# Patient Record
Sex: Female | Born: 1970 | Race: White | Hispanic: No | Marital: Single | Smoking: Former smoker
Health system: Southern US, Community
[De-identification: ages and names within clinical notes are randomized; demographics above are authoritative.]

## PROBLEM LIST (undated history)

## (undated) DIAGNOSIS — E7800 Pure hypercholesterolemia, unspecified: Secondary | ICD-10-CM

## (undated) DIAGNOSIS — J45990 Exercise induced bronchospasm: Secondary | ICD-10-CM

## (undated) DIAGNOSIS — E559 Vitamin D deficiency, unspecified: Secondary | ICD-10-CM

## (undated) DIAGNOSIS — R7303 Prediabetes: Secondary | ICD-10-CM

## (undated) DIAGNOSIS — T4145XA Adverse effect of unspecified anesthetic, initial encounter: Secondary | ICD-10-CM

## (undated) DIAGNOSIS — Z87442 Personal history of urinary calculi: Secondary | ICD-10-CM

## (undated) DIAGNOSIS — F32A Depression, unspecified: Secondary | ICD-10-CM

## (undated) DIAGNOSIS — T8859XA Other complications of anesthesia, initial encounter: Secondary | ICD-10-CM

## (undated) DIAGNOSIS — R32 Unspecified urinary incontinence: Secondary | ICD-10-CM

## (undated) DIAGNOSIS — M069 Rheumatoid arthritis, unspecified: Secondary | ICD-10-CM

## (undated) DIAGNOSIS — N200 Calculus of kidney: Secondary | ICD-10-CM

## (undated) DIAGNOSIS — T7840XA Allergy, unspecified, initial encounter: Secondary | ICD-10-CM

## (undated) DIAGNOSIS — F329 Major depressive disorder, single episode, unspecified: Secondary | ICD-10-CM

## (undated) DIAGNOSIS — G4730 Sleep apnea, unspecified: Secondary | ICD-10-CM

## (undated) HISTORY — DX: Exercise induced bronchospasm: J45.990

## (undated) HISTORY — DX: Pure hypercholesterolemia, unspecified: E78.00

## (undated) HISTORY — DX: Unspecified urinary incontinence: R32

## (undated) HISTORY — DX: Calculus of kidney: N20.0

## (undated) HISTORY — PX: PALATE / UVULA BIOPSY / EXCISION: SUR128

## (undated) HISTORY — DX: Prediabetes: R73.03

## (undated) HISTORY — PX: GALLBLADDER SURGERY: SHX652

## (undated) HISTORY — DX: Allergy, unspecified, initial encounter: T78.40XA

## (undated) HISTORY — DX: Vitamin D deficiency, unspecified: E55.9

## (undated) HISTORY — PX: OTHER SURGICAL HISTORY: SHX169

## (undated) HISTORY — PX: KIDNEY STONE SURGERY: SHX686

## (undated) HISTORY — DX: Depression, unspecified: F32.A

## (undated) HISTORY — DX: Rheumatoid arthritis, unspecified: M06.9

## (undated) HISTORY — PX: TONSILLECTOMY: SUR1361

---
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
## 1998-05-20 ENCOUNTER — Encounter: Payer: Self-pay | Admitting: Family Medicine

## 1998-05-20 ENCOUNTER — Ambulatory Visit (HOSPITAL_COMMUNITY): Admission: RE | Admit: 1998-05-20 | Discharge: 1998-05-20 | Payer: Self-pay | Admitting: Family Medicine

## 2004-03-05 ENCOUNTER — Emergency Department (HOSPITAL_COMMUNITY): Admission: EM | Admit: 2004-03-05 | Discharge: 2004-03-05 | Payer: Self-pay | Admitting: Emergency Medicine

## 2006-03-14 ENCOUNTER — Other Ambulatory Visit: Admission: RE | Admit: 2006-03-14 | Discharge: 2006-03-14 | Payer: Self-pay | Admitting: Internal Medicine

## 2008-09-27 ENCOUNTER — Ambulatory Visit (HOSPITAL_COMMUNITY): Admission: RE | Admit: 2008-09-27 | Discharge: 2008-09-28 | Payer: Self-pay | Admitting: Otolaryngology

## 2008-09-27 ENCOUNTER — Encounter (INDEPENDENT_AMBULATORY_CARE_PROVIDER_SITE_OTHER): Payer: Self-pay | Admitting: Otolaryngology

## 2008-11-28 ENCOUNTER — Ambulatory Visit (HOSPITAL_BASED_OUTPATIENT_CLINIC_OR_DEPARTMENT_OTHER): Admission: RE | Admit: 2008-11-28 | Discharge: 2008-11-28 | Payer: Self-pay | Admitting: Otolaryngology

## 2008-12-08 ENCOUNTER — Ambulatory Visit: Payer: Self-pay | Admitting: Internal Medicine

## 2009-06-14 ENCOUNTER — Encounter: Admission: RE | Admit: 2009-06-14 | Discharge: 2009-06-14 | Payer: Self-pay | Admitting: Internal Medicine

## 2010-04-24 LAB — BASIC METABOLIC PANEL
BUN: 11 mg/dL (ref 6–23)
CO2: 22 mEq/L (ref 19–32)
Calcium: 9 mg/dL (ref 8.4–10.5)
Chloride: 105 mEq/L (ref 96–112)
Creatinine, Ser: 0.82 mg/dL (ref 0.4–1.2)
GFR calc Af Amer: >60 mL/min (ref >60)
GFR calc non Af Amer: >60 mL/min (ref >60)
Glucose, Bld: 110 mg/dL — ABNORMAL HIGH (ref 70–99)
Potassium: 4.4 mEq/L (ref 3.5–5.1)
Sodium: 135 mEq/L (ref 135–145)

## 2010-04-24 LAB — CBC
HCT: 42.3 % (ref 36.0–46.0)
Hemoglobin: 14.3 g/dL (ref 12.0–15.0)
MCHC: 33.7 g/dL (ref 30.0–36.0)
MCV: 91.3 fL (ref 78.0–100.0)
Platelets: 284 10*3/uL (ref 150–400)
RBC: 4.64 MIL/uL (ref 3.87–5.11)
RDW: 13.8 % (ref 11.5–15.5)
WBC: 11.7 10*3/uL — ABNORMAL HIGH (ref 4.0–10.5)

## 2010-07-29 IMAGING — CR DG CHEST 2V
2 series · 2 of 2 positions shown · non-contrast
Comparison: None

CLINICAL DATA: History of coughing.  History of previous tobacco
smoking.  Cardiopulmonary evaluation.

CHEST - 2 VIEW

[view not recorded (1 of 2)]
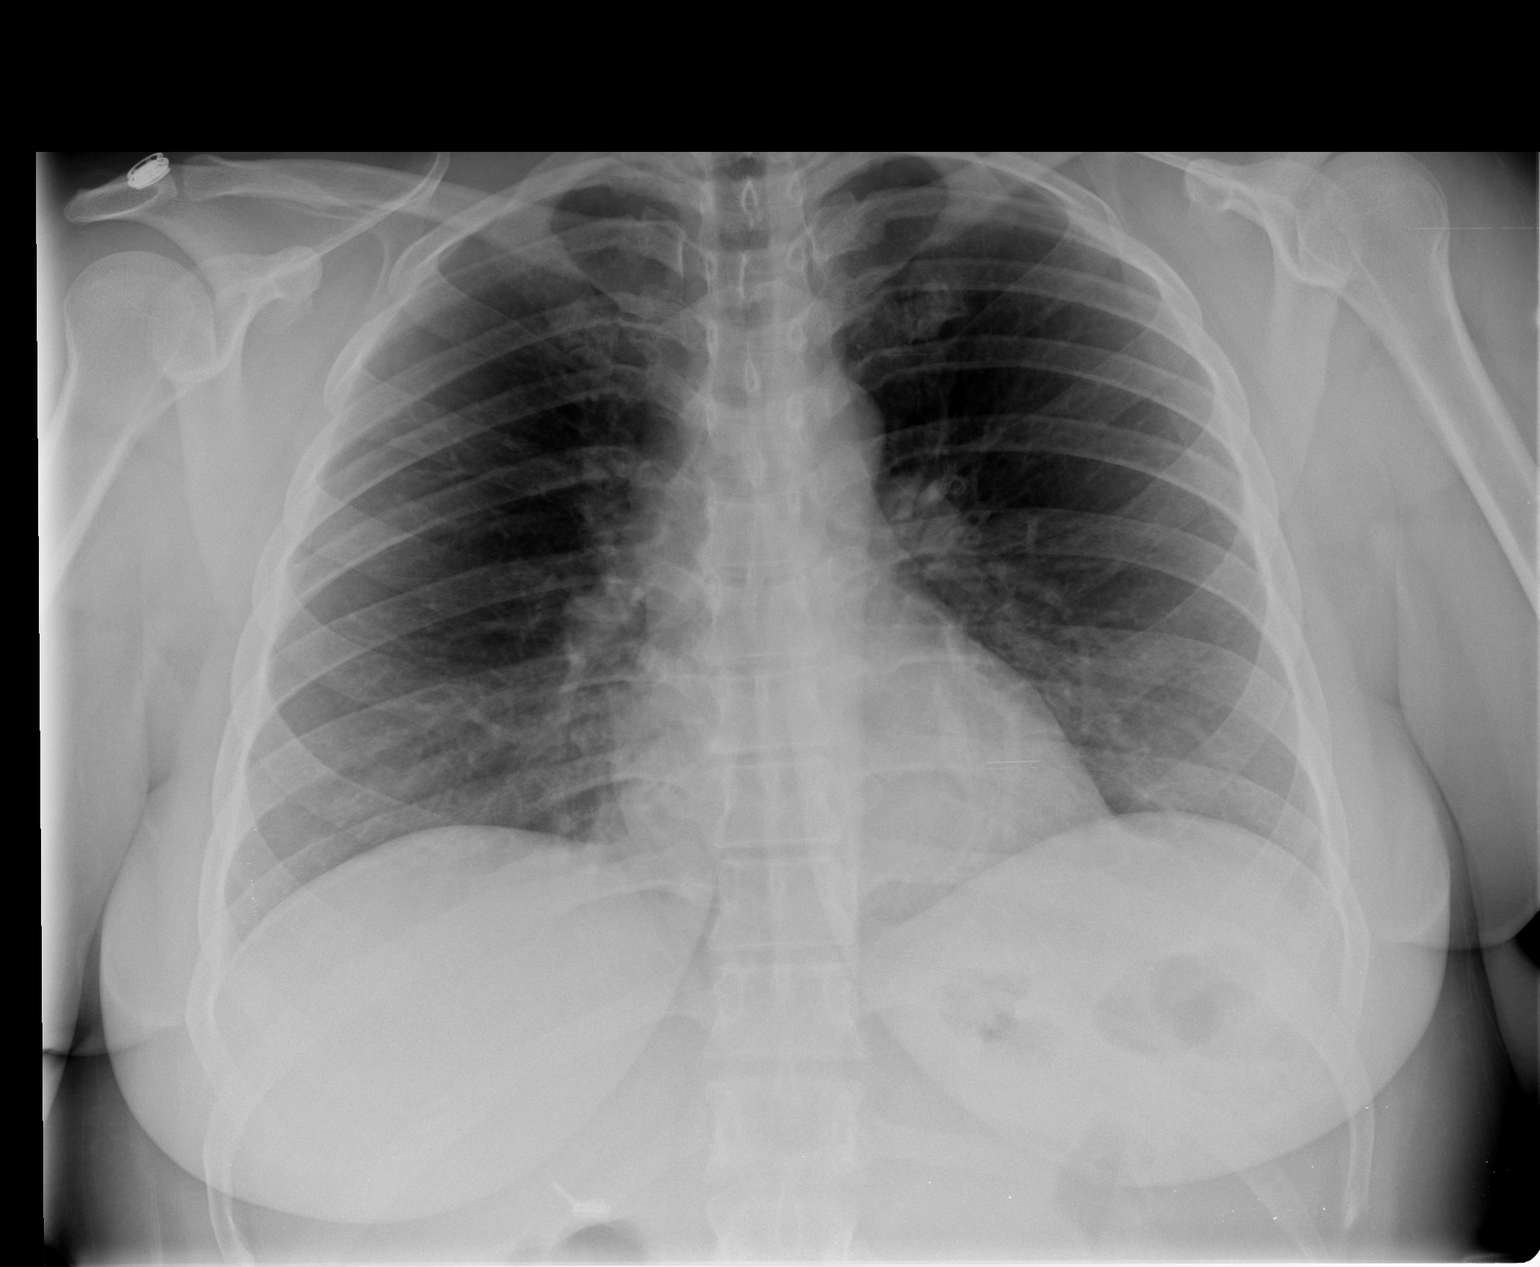

[view not recorded (2 of 2)]
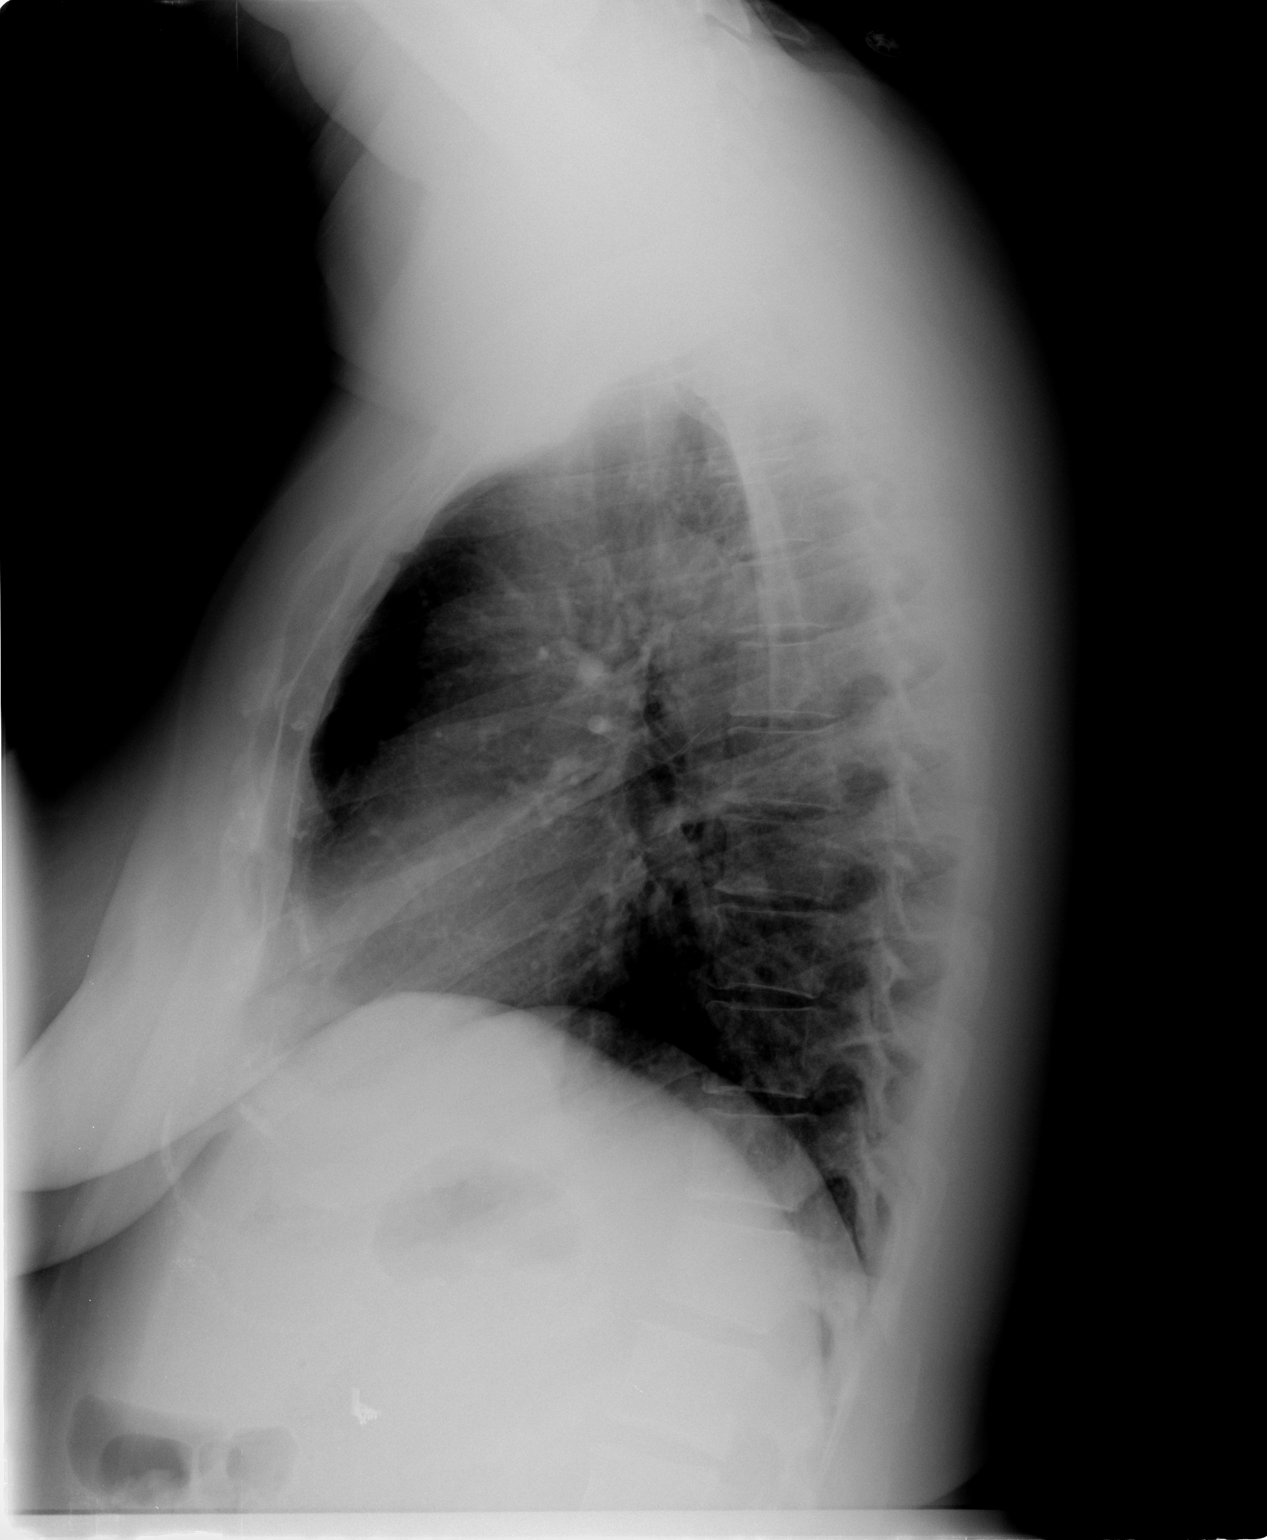

[2 of 2 positions shown; findings below may reference images not displayed]

FINDINGS: The cardiac silhouette is normal size and shape. The
lungs are well aerated and free of infiltrates. No pleural
abnormality is evident. Bones appear average for age.
IMPRESSION: No acute or active process is identified.

## 2010-08-18 ENCOUNTER — Other Ambulatory Visit: Payer: Self-pay | Admitting: Registered Nurse

## 2010-08-18 ENCOUNTER — Other Ambulatory Visit (HOSPITAL_COMMUNITY)
Admission: RE | Admit: 2010-08-18 | Discharge: 2010-08-18 | Disposition: A | Discharge status: Home / Self Care | Payer: BC Managed Care – PPO | Source: Ambulatory Visit | Attending: Internal Medicine | Admitting: Internal Medicine

## 2010-08-18 DIAGNOSIS — Z01419 Encounter for gynecological examination (general) (routine) without abnormal findings: Secondary | ICD-10-CM | POA: Insufficient documentation

## 2013-05-25 ENCOUNTER — Other Ambulatory Visit: Payer: Self-pay

## 2013-05-25 DIAGNOSIS — Z1231 Encounter for screening mammogram for malignant neoplasm of breast: Secondary | ICD-10-CM

## 2013-06-13 ENCOUNTER — Encounter (INDEPENDENT_AMBULATORY_CARE_PROVIDER_SITE_OTHER): Payer: Self-pay

## 2013-06-13 ENCOUNTER — Ambulatory Visit
Admission: RE | Admit: 2013-06-13 | Discharge: 2013-06-13 | Disposition: A | Discharge status: Home / Self Care | Payer: BC Managed Care – PPO | Source: Ambulatory Visit

## 2013-06-13 DIAGNOSIS — Z1231 Encounter for screening mammogram for malignant neoplasm of breast: Secondary | ICD-10-CM

## 2013-06-15 ENCOUNTER — Other Ambulatory Visit: Payer: Self-pay | Admitting: Internal Medicine

## 2013-06-15 DIAGNOSIS — R928 Other abnormal and inconclusive findings on diagnostic imaging of breast: Secondary | ICD-10-CM

## 2013-06-27 ENCOUNTER — Ambulatory Visit
Admission: RE | Admit: 2013-06-27 | Discharge: 2013-06-27 | Disposition: A | Discharge status: Home / Self Care | Payer: BC Managed Care – PPO | Source: Ambulatory Visit | Attending: Internal Medicine | Admitting: Internal Medicine

## 2013-06-27 ENCOUNTER — Other Ambulatory Visit: Payer: Self-pay | Admitting: Internal Medicine

## 2013-06-27 DIAGNOSIS — R928 Other abnormal and inconclusive findings on diagnostic imaging of breast: Secondary | ICD-10-CM

## 2013-06-27 DIAGNOSIS — N6001 Solitary cyst of right breast: Secondary | ICD-10-CM

## 2014-07-10 ENCOUNTER — Other Ambulatory Visit: Payer: Self-pay

## 2017-02-21 DIAGNOSIS — M0579 Rheumatoid arthritis with rheumatoid factor of multiple sites without organ or systems involvement: Secondary | ICD-10-CM | POA: Diagnosis not present

## 2017-02-21 DIAGNOSIS — M2550 Pain in unspecified joint: Secondary | ICD-10-CM | POA: Diagnosis not present

## 2017-02-21 DIAGNOSIS — Z79899 Other long term (current) drug therapy: Secondary | ICD-10-CM | POA: Diagnosis not present

## 2017-02-22 DIAGNOSIS — J111 Influenza due to unidentified influenza virus with other respiratory manifestations: Secondary | ICD-10-CM | POA: Diagnosis not present

## 2018-03-27 DIAGNOSIS — R21 Rash and other nonspecific skin eruption: Secondary | ICD-10-CM | POA: Diagnosis not present

## 2018-03-27 DIAGNOSIS — Z79899 Other long term (current) drug therapy: Secondary | ICD-10-CM | POA: Diagnosis not present

## 2018-03-27 DIAGNOSIS — M0579 Rheumatoid arthritis with rheumatoid factor of multiple sites without organ or systems involvement: Secondary | ICD-10-CM | POA: Diagnosis not present

## 2018-03-27 DIAGNOSIS — M2550 Pain in unspecified joint: Secondary | ICD-10-CM | POA: Diagnosis not present

## 2018-06-29 DIAGNOSIS — M2550 Pain in unspecified joint: Secondary | ICD-10-CM | POA: Diagnosis not present

## 2018-06-29 DIAGNOSIS — Z79899 Other long term (current) drug therapy: Secondary | ICD-10-CM | POA: Diagnosis not present

## 2018-06-29 DIAGNOSIS — M0579 Rheumatoid arthritis with rheumatoid factor of multiple sites without organ or systems involvement: Secondary | ICD-10-CM | POA: Diagnosis not present

## 2018-07-26 DIAGNOSIS — N3091 Cystitis, unspecified with hematuria: Secondary | ICD-10-CM | POA: Diagnosis not present

## 2018-07-26 DIAGNOSIS — N3001 Acute cystitis with hematuria: Secondary | ICD-10-CM | POA: Diagnosis not present

## 2018-08-10 DIAGNOSIS — Z79899 Other long term (current) drug therapy: Secondary | ICD-10-CM | POA: Diagnosis not present

## 2018-08-10 DIAGNOSIS — M0579 Rheumatoid arthritis with rheumatoid factor of multiple sites without organ or systems involvement: Secondary | ICD-10-CM | POA: Diagnosis not present

## 2018-08-10 DIAGNOSIS — M2550 Pain in unspecified joint: Secondary | ICD-10-CM | POA: Diagnosis not present

## 2018-08-10 DIAGNOSIS — R21 Rash and other nonspecific skin eruption: Secondary | ICD-10-CM | POA: Diagnosis not present

## 2018-10-02 ENCOUNTER — Encounter: Payer: Self-pay | Admitting: Family Medicine

## 2018-10-02 ENCOUNTER — Ambulatory Visit (INDEPENDENT_AMBULATORY_CARE_PROVIDER_SITE_OTHER): Payer: BC Managed Care – PPO | Admitting: Family Medicine

## 2018-10-02 ENCOUNTER — Ambulatory Visit (INDEPENDENT_AMBULATORY_CARE_PROVIDER_SITE_OTHER): Payer: BC Managed Care – PPO

## 2018-10-02 DIAGNOSIS — M25551 Pain in right hip: Secondary | ICD-10-CM

## 2018-10-02 MED ORDER — NABUMETONE 750 MG PO TABS: 750.0000 mg | ORAL_TABLET | Freq: Two times a day (BID) | ORAL | 6 refills | Status: DC | PRN

## 2018-10-02 NOTE — Progress Notes (Signed)
   Office Visit Note   Patient: Olivia Sims           Date of Birth: 1970-01-19           MRN: [id number] Visit Date: 10/02/2018 Requested by: No referring provider defined for this encounter. PCP: Patient, No Pcp Per  Subjective: Chief Complaint  Patient presents with  . Right Hip - Pain    HPI: She is here with right hip pain.  She has a history of rheumatoid arthritis.  Symptoms present for the past few weeks, no injury.  Anterior pain which is worse with sideways movements of her hip.  No popping or catching.  She is on methotrexate and Enbrel for rheumatoid arthritis.  She gets occasional low back pain without radicular symptoms.              ROS: No fevers or chills.  All other systems were reviewed and are negative.  Objective: Vital Signs: There were no vitals taken for this visit.  Physical Exam:  General:  Alert and oriented, in no acute distress. Pulm:  Breathing unlabored. Psy:  Normal mood, congruent affect. Skin: She has psoriasis on her right arm. Right hip: Slightly tender over the greater trochanter.  Maximum pain is with passive internal rotation.  Still has good range of motion.  Lower extremity strength and reflexes are normal.  Low back has some tender trigger points but mainly on the left side.  Imaging: X-rays right hip: Well-preserved joint space, no sign of stress fracture or DJD.    Assessment & Plan: 1.  Right hip pain, possibly flareup of rheumatoid arthritis versus acetabular labrum tear. -Discussed options with her, she wants to try an injection followed by Relafen as needed.  If symptoms persist, then MRI arthrogram.     Procedures: Ultrasound-guided right hip injection: After sterile prep with Betadine, injected 8 cc 1% lidocaine without epinephrine and 40 mg methylprednisolone passing the needle through the iliofemoral ligament into the femoral head/neck junction.  Injectate was seen filling the joint capsule.  She had minimal immediate  relief.    PMFS History: There are no active problems to display for this patient.  History reviewed. No pertinent past medical history.  History reviewed. No pertinent family history.  History reviewed. No pertinent surgical history. Social History   Occupational History  . Not on file  Tobacco Use  . Smoking status: Not on file  Substance and Sexual Activity  . Alcohol use: Not on file  . Drug use: Not on file  . Sexual activity: Not on file

## 2018-10-04 ENCOUNTER — Other Ambulatory Visit: Payer: Self-pay | Admitting: Family Medicine

## 2018-10-04 MED ORDER — ETODOLAC 400 MG PO TABS: 400.0000 mg | ORAL_TABLET | Freq: Two times a day (BID) | ORAL | 6 refills | Status: DC | PRN

## 2018-10-25 DIAGNOSIS — Z20828 Contact with and (suspected) exposure to other viral communicable diseases: Secondary | ICD-10-CM | POA: Diagnosis not present

## 2018-11-01 DIAGNOSIS — R0602 Shortness of breath: Secondary | ICD-10-CM | POA: Diagnosis not present

## 2018-11-01 DIAGNOSIS — J209 Acute bronchitis, unspecified: Secondary | ICD-10-CM | POA: Diagnosis not present

## 2018-11-01 DIAGNOSIS — J0100 Acute maxillary sinusitis, unspecified: Secondary | ICD-10-CM | POA: Diagnosis not present

## 2018-11-24 DIAGNOSIS — Z79899 Other long term (current) drug therapy: Secondary | ICD-10-CM | POA: Diagnosis not present

## 2018-11-24 DIAGNOSIS — M2550 Pain in unspecified joint: Secondary | ICD-10-CM | POA: Diagnosis not present

## 2018-11-24 DIAGNOSIS — R21 Rash and other nonspecific skin eruption: Secondary | ICD-10-CM | POA: Diagnosis not present

## 2018-11-24 DIAGNOSIS — M0579 Rheumatoid arthritis with rheumatoid factor of multiple sites without organ or systems involvement: Secondary | ICD-10-CM | POA: Diagnosis not present

## 2018-11-28 ENCOUNTER — Encounter: Payer: Self-pay | Admitting: Obstetrics and Gynecology

## 2018-12-21 DIAGNOSIS — Z79899 Other long term (current) drug therapy: Secondary | ICD-10-CM | POA: Diagnosis not present

## 2018-12-21 DIAGNOSIS — M0579 Rheumatoid arthritis with rheumatoid factor of multiple sites without organ or systems involvement: Secondary | ICD-10-CM | POA: Diagnosis not present

## 2018-12-21 DIAGNOSIS — M2550 Pain in unspecified joint: Secondary | ICD-10-CM | POA: Diagnosis not present

## 2018-12-21 DIAGNOSIS — R21 Rash and other nonspecific skin eruption: Secondary | ICD-10-CM | POA: Diagnosis not present

## 2018-12-28 ENCOUNTER — Ambulatory Visit (INDEPENDENT_AMBULATORY_CARE_PROVIDER_SITE_OTHER): Payer: BC Managed Care – PPO | Admitting: Family Medicine

## 2018-12-28 ENCOUNTER — Encounter: Payer: Self-pay | Admitting: Family Medicine

## 2018-12-28 ENCOUNTER — Other Ambulatory Visit: Payer: Self-pay

## 2018-12-28 VITALS — BP 140/90 | HR 103 | Temp 97.8°F | Ht 63.5 in | Wt 226.0 lb

## 2018-12-28 DIAGNOSIS — N200 Calculus of kidney: Secondary | ICD-10-CM | POA: Insufficient documentation

## 2018-12-28 DIAGNOSIS — Z7689 Persons encountering health services in other specified circumstances: Secondary | ICD-10-CM | POA: Diagnosis not present

## 2018-12-28 DIAGNOSIS — F329 Major depressive disorder, single episode, unspecified: Secondary | ICD-10-CM

## 2018-12-28 DIAGNOSIS — M069 Rheumatoid arthritis, unspecified: Secondary | ICD-10-CM | POA: Diagnosis not present

## 2018-12-28 DIAGNOSIS — F32A Depression, unspecified: Secondary | ICD-10-CM

## 2018-12-28 NOTE — Progress Notes (Signed)
Subjective:    Patient ID: Olivia Sims, female    DOB: 1970-11-03, 48 y.o.   MRN: [id number]  HPI Chief Complaint  Patient presents with  . new pt    new pt get established. having some depression issues and seeing psych on dec 17th. depression causing RA to flare up but RA doctor won't fill out STD forms. forms are due STD on december 15th   She is new to the practice and here to establish care.  No PCP in at least 2 years   Has appointment with OB/GYN in January   Other providers: Dr. Amil Amen- rheumatologist  Dr. Junius Roads- orthopedist   Past medical history includes:  Depression - diagnosed approximately 10 years. She took Wellbutrin for a couple of years and stopped it 3 years ago.  Her therapist is [facility]. This is through her company.   States she been on Cymbalta since June.   States she has an appointment with a psychiatrist at [facility] on 01/04/2019.   She has short term disability paperwork with her today. States her employer suggested she file for this so she did and stopped working on 12/18/2018. States the deadline to have the paperwork filled out is 01/02/2019. She is hoping I can sign off on this for her.   States she has FMLA for her RA but it ran out because she was having to use it for RA and depression.   States Dr. Amil Amen filled out ADA paperwork to accommodate for RA flares until she can get FMLA again in the spring.   Works for [employer].   Single. No kids. 1 dog and 1 cat.  Education: HS degree   Reviewed allergies, medications, past medical, surgical, family, and social history.   Depression screen PHQ 2/9 12/28/2018  Decreased Interest 1  Down, Depressed, Hopeless 1  PHQ - 2 Score 2  Altered sleeping 2  Tired, decreased energy 2  Change in appetite 0  Feeling bad or failure about yourself  1  Trouble concentrating 2  Moving slowly or fidgety/restless 1  Suicidal thoughts 0  PHQ-9 Score 10  Difficult doing work/chores  Somewhat difficult    Denies smoking, drinking alcohol, drug use. Former smoker.    Reviewed allergies, medications, past medical, surgical, and social history.   Review of Systems Pertinent positives and negatives in the history of present illness.     Objective:   Physical Exam BP 140/90   Pulse (!) 103   Temp 97.8 F (36.6 C)   Ht 5' 3.5" (1.613 m)   Wt 226 lb (102.5 kg)   LMP 12/19/2018   BMI 39.41 kg/m   Alert and oriented and in no acute distress. Not otherwise examined.       Assessment & Plan:  Depression, unspecified depression type  Rheumatoid arthritis, involving unspecified site, unspecified whether rheumatoid factor present ([place])  Encounter to establish care  She is a pleasant 48 year old female who is new to the practice and here to establish care. She is managing her depression by taking Cymbalta and seeing a therapist. States she took herself out of work with the recommendation of her employer on 12/18/2018. She is filing for short term disability. Discussed case with Dr. Redmond School and he agrees that I cannot retroactively sign off on her absence from work since I am just meeting her today. She has an appointment with a new psychiatrist on 01/04/2019 and I recommend asking her therapist to sign off on her  disability papers or ask her employer to extend the deadline of her due date so that she may talk to her psychiatrist about the paperwork. She is understanding of this situation.

## 2019-01-04 ENCOUNTER — Other Ambulatory Visit: Payer: Self-pay

## 2019-01-04 ENCOUNTER — Encounter: Payer: Self-pay | Admitting: Adult Health

## 2019-01-04 ENCOUNTER — Ambulatory Visit (INDEPENDENT_AMBULATORY_CARE_PROVIDER_SITE_OTHER): Payer: BC Managed Care – PPO | Admitting: Adult Health

## 2019-01-04 VITALS — BP 170/98 | HR 115 | Ht 61.0 in | Wt 220.0 lb

## 2019-01-04 DIAGNOSIS — F331 Major depressive disorder, recurrent, moderate: Secondary | ICD-10-CM

## 2019-01-04 DIAGNOSIS — G4700 Insomnia, unspecified: Secondary | ICD-10-CM

## 2019-01-04 DIAGNOSIS — R5383 Other fatigue: Secondary | ICD-10-CM | POA: Diagnosis not present

## 2019-01-04 DIAGNOSIS — Z0289 Encounter for other administrative examinations: Secondary | ICD-10-CM

## 2019-01-04 DIAGNOSIS — F411 Generalized anxiety disorder: Secondary | ICD-10-CM | POA: Diagnosis not present

## 2019-01-04 MED ORDER — BUPROPION HCL ER (XL) 150 MG PO TB24: ORAL_TABLET | ORAL | 2 refills | Status: DC

## 2019-01-04 NOTE — Progress Notes (Signed)
[facility] MD/PA/NP Initial Note  01/04/2019 8:51 AM Olivia Sims  MRN:  [id number]  Chief Complaint:  Chief Complaint    Anxiety; Depression; Insomnia; Fatigue      HPI:   Referred by therapist.  Describes mood today as "ok". Pleasant. Flat. Cries for no reason. Mood symptoms - depression, anxiety, and irritability. More depressed and sad than anything else. Having very low days and some normal days during the week - "feels like it's half and half". Cannot identify a precipitent. Has "battled" with depression over the years. This year has been "difficult". Lost her dog of 11 years in May "long time companion". Had to take FMLA in June. Went back to work after 3 weeks. Depression has started to worsen again over past 2 months. Having issues when dealing with customers. Forgetting processes - numbers - repeating questions to customers. RA is acting "up". Increased pain. Stating "it gets worse when I don't sleep". Employer asked that she take time of to get some "help".  Stable interest and motivation. Taking medications as prescribed.  Energy levels low. Active, does not have a regular exercise routine. Uses exercise bike "sometimes". 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 Is out of work currently at the suggestion of her empoyer to take short term disability.  Enjoys some usual interests and activities. Likes to color and do crossword puzzles. Single. Lives with sister. Mother and father local. Mostly stays home. Will take a drive sometimes to get out of the house. Appetite adequate. Weight stable. Sleeps well most nights. Averages 4 hours. Previous history of sleep apnea. Focus and concentration difficulties. Completing tasks. Managing some aspects of household - laundry, cleaning. Able to complete "small" projects. Taking longer on "bigger" projects - "rearranging furniture". Denies SI or HI. Denies AH or VH.  Visit Diagnosis:    ICD-10-CM   1. Major depressive  disorder, recurrent episode, moderate (HCC)  F33.1   2. Generalized anxiety disorder  F41.1   3. Insomnia, unspecified type  G47.00   4. Fatigue, unspecified type  R53.83     Past Psychiatric History: Denies psychiatric hospitalization.  Past Medical History:  Past Medical History:  Diagnosis Date  . Depression   . Kidney stones   . Rheumatoid arthritis [facility])     Past Surgical History:  Procedure Laterality Date  . GALLBLADDER SURGERY    . KIDNEY STONE SURGERY    . shoulder surgery     . TONSILLECTOMY      Family Psychiatric History: Family history of depression.   Family History: No family history on file.  Social History:  Social History   Socioeconomic History  . Marital status: Single    Spouse name: Not on file  . Number of children: Not on file  . Years of education: Not on file  . Highest education level: Not on file  Occupational History  . Not on file  Tobacco Use  . Smoking status: Former Smoker    Quit date: 2002    Years since quitting: 18.9  . Smokeless tobacco: Never Used  Substance and Sexual Activity  . Alcohol use: Yes    Comment: once a week  . Drug use: Never  . Sexual activity: Not Currently  Other Topics Concern  . Not on file  Social History Narrative  . Not on file   Social Determinants of Health   Financial Resource Strain:   . Difficulty of Paying Living Expenses: Not on file  Food Insecurity:   .  Worried About Charity fundraiser in the Last Year: Not on file  . Ran Out of Food in the Last Year: Not on file  Transportation Needs:   . Lack of Transportation (Medical): Not on file  . Lack of Transportation (Non-Medical): Not on file  Physical Activity:   . Days of Exercise per Week: Not on file  . Minutes of Exercise per Session: Not on file  Stress:   . Feeling of Stress : Not on file  Social Connections:   . Frequency of Communication with Friends and Family: Not on file  . Frequency of Social Gatherings with Friends and  Family: Not on file  . Attends Religious Services: Not on file  . Active Member of Clubs or Organizations: Not on file  . Attends Archivist Meetings: Not on file  . Marital Status: Not on file    Allergies: No Known Allergies  Metabolic Disorder Labs: No results found for: HGBA1C, MPG No results found for: PROLACTIN No results found for: CHOL, TRIG, HDL, CHOLHDL, VLDL, LDLCALC No results found for: TSH  Therapeutic Level Labs: No results found for: LITHIUM No results found for: VALPROATE No components found for:  CBMZ  Current Medications: Current Outpatient Medications  Medication Sig Dispense Refill  . albuterol (VENTOLIN HFA) 108 (90 Base) MCG/ACT inhaler Inhale 2 puffs into the lungs 4 (four) times daily as needed.    Marland Kitchen buPROPion (WELLBUTRIN XL) 150 MG 24 hr tablet Take one tablet every morning x 7 days, then take two tablets every morning. 60 tablet 2  . DULoxetine (CYMBALTA) 60 MG capsule Take 60 mg by mouth daily.    Marland Kitchen etanercept (ENBREL) 50 MG/ML injection Inject 50 mg into the skin once a week.    . fexofenadine (ALLEGRA) 180 MG tablet Take 180 mg by mouth daily.    . folic acid (FOLVITE) 1 MG tablet Take 1 mg by mouth daily.    . methotrexate 50 MG/2ML injection Inject 50 mg into the muscle once a week.     No current facility-administered medications for this visit.    Medication Side Effects: none  Orders placed this visit:  No orders of the defined types were placed in this encounter.   Psychiatric Specialty Exam:  Review of Systems  Musculoskeletal: Negative for gait problem.  Neurological: Negative for tremors.  Psychiatric/Behavioral: Positive for sleep disturbance.    Blood pressure (!) 170/98, pulse (!) 115, height 5\' 1"  (1.549 m), weight 220 lb (99.8 kg), last menstrual period 12/19/2018.Body mass index is 41.57 kg/m.  General Appearance: Neat and Well Groomed  Eye Contact:  Good  Speech:  Clear and Coherent  Volume:  Normal  Mood:   Depressed  Affect:  Appropriate and Congruent  Thought Process:  Coherent and Descriptions of Associations: Intact  Orientation:  Full (Time, Place, and Person)  Thought Content: Logical   Suicidal Thoughts:  No  Homicidal Thoughts:  No  Memory:  WNL  Judgement:  Good  Insight:  Good  Psychomotor Activity:  Normal  Concentration:  Concentration: Fair  Recall:  NA  Fund of Knowledge: Good  Language: Good  Assets:  Communication Skills Desire for Improvement Financial Resources/Insurance Housing Intimacy Leisure Time Physical Health Resilience Social Support Talents/Skills Transportation Vocational/Educational  ADL's:  Intact  Cognition: WNL  Prognosis:  Good   Screenings:  PHQ2-9     Office Visit from 12/28/2018 in [facility]  PHQ-2 Total Score  2  PHQ-9 Total Score  10  Receiving Psychotherapy: Yes - Verne Spurr - started seeing in July - week.  Treatment Plan/Recommendations:   Plan:  1. Add Wellbutrin XL 150mg  daily x 7 days, then increase to 300mg  daily - denies seizure history.   Patient to remain out of work at this time. Out of work 01/04/2019 through 01/31/2018. Will assess a RTW date at next visit.   RTC 4 weeks  Patient advised to contact office with any questions, adverse effects, or acute worsening in signs and symptoms.   01/06/2019, NP

## 2019-01-08 ENCOUNTER — Other Ambulatory Visit: Payer: Self-pay

## 2019-01-08 ENCOUNTER — Encounter: Payer: Self-pay | Admitting: Family Medicine

## 2019-01-08 ENCOUNTER — Ambulatory Visit (INDEPENDENT_AMBULATORY_CARE_PROVIDER_SITE_OTHER): Payer: BC Managed Care – PPO | Admitting: Family Medicine

## 2019-01-08 VITALS — BP 120/80 | HR 100 | Temp 98.2°F | Ht 64.25 in | Wt 227.6 lb

## 2019-01-08 DIAGNOSIS — Z1329 Encounter for screening for other suspected endocrine disorder: Secondary | ICD-10-CM | POA: Diagnosis not present

## 2019-01-08 DIAGNOSIS — E669 Obesity, unspecified: Secondary | ICD-10-CM | POA: Diagnosis not present

## 2019-01-08 DIAGNOSIS — R21 Rash and other nonspecific skin eruption: Secondary | ICD-10-CM

## 2019-01-08 DIAGNOSIS — E559 Vitamin D deficiency, unspecified: Secondary | ICD-10-CM

## 2019-01-08 DIAGNOSIS — F32A Depression, unspecified: Secondary | ICD-10-CM

## 2019-01-08 DIAGNOSIS — R7303 Prediabetes: Secondary | ICD-10-CM

## 2019-01-08 DIAGNOSIS — Z23 Encounter for immunization: Secondary | ICD-10-CM

## 2019-01-08 DIAGNOSIS — Z13220 Encounter for screening for lipoid disorders: Secondary | ICD-10-CM

## 2019-01-08 DIAGNOSIS — F329 Major depressive disorder, single episode, unspecified: Secondary | ICD-10-CM

## 2019-01-08 DIAGNOSIS — L608 Other nail disorders: Secondary | ICD-10-CM

## 2019-01-08 DIAGNOSIS — Z0000 Encounter for general adult medical examination without abnormal findings: Secondary | ICD-10-CM

## 2019-01-08 DIAGNOSIS — J45990 Exercise induced bronchospasm: Secondary | ICD-10-CM

## 2019-01-08 DIAGNOSIS — M069 Rheumatoid arthritis, unspecified: Secondary | ICD-10-CM

## 2019-01-08 DIAGNOSIS — Z8669 Personal history of other diseases of the nervous system and sense organs: Secondary | ICD-10-CM

## 2019-01-08 NOTE — Progress Notes (Signed)
Subjective:    Patient ID: Olivia Sims, female    DOB: 1970/04/10, 48 y.o.   MRN: [id number]  HPI Chief Complaint  Patient presents with  . fasting cpe    fasting cpe, sees obgyn on January 6th. no other concerns. ph-9 abnormal but saw psych and therapist recently, on new med   She is here for a complete physical exam. This is her second visit.  Previous medical care: no PCP for the past 3 years. Dr. Minna Antis was her PCP  Last CPE: 3 years ago  No PCP in at least 2 years   Has appointment with OB/GYN in January   Other providers: Dr. Amil Amen- rheumatologist  Dr. Junius Roads- orthopedist  OB/GYN - Dr. Yisroel Ramming  Psychiatrist - [facility] [place]  Therapist - [facility] for GI issues   Past medical history includes:  Depression - diagnosed approximately 10 years. She took Wellbutrin for a couple of years and stopped it 3 years ago.  Her therapist is [facility]. This is through her company.   States she been on Cymbalta since June.  Recent appointment with a psychiatrist at [facility] and was started back on Wellbutrin.    History of sleep apnea which resolved after uvula removal. She has had sleep studies.   Hx of exercise-induced asthma. Has inhaler but rarely uses it.   Reports having prediabetes. Last Hgb A1c unknown per patient.   Nail deformiity on right hand and slightly pruritic rash on right forearm Plans to see a dermatologist.   RA- managed by Dr. Amil Amen. On  Enbrel and methotrexate.   Vitamin D deficiency- is not on a supplement.   Social history: Lives with her siste , works for [employer]. Out on short term disability per psychiatrist.  Former smoke- quit in 2002. occasionally drinks alcohol. Denies drug use  Diet: indiscriminate  Excerise: nothing regular   Immunizations: needs flu shot. Tdap UTD per patient.   Health maintenance:  Mammogram: years ago and has appt in January  Colonoscopy: never Last Gynecological Exam:  scheduled in January.  Last Menstrual cycle: irregular  Pregnancies: 0 Last Dental Exam: 01/2018 Last Eye Exam: 1 month ago   Wears seatbelt always, smoke detectors in home and functioning, does not text while driving and feels safe in home environment.   Reviewed allergies, medications, past medical, surgical, family, and social history.   Review of Systems Review of Systems Constitutional: -fever, -chills, -sweats, -unexpected weight change,-fatigue ENT: -runny nose, -ear pain, -sore throat Cardiology:  -chest pain, -palpitations, -edema Respiratory: -cough, -shortness of breath, -wheezing Gastroenterology: -abdominal pain, -nausea, -vomiting, -diarrhea, -constipation  Hematology: -bleeding or bruising problems Musculoskeletal: -arthralgias, -myalgias, -joint swelling, -back pain Ophthalmology: -vision changes Urology: -dysuria, -difficulty urinating, -hematuria, -urinary frequency, -urgency Neurology: -headache, -weakness, -tingling, -numbness       Objective:   Physical Exam BP 120/80   Pulse 100   Temp 98.2 F (36.8 C)   Ht 5' 4.25" (1.632 m)   Wt 227 lb 9.6 oz (103.2 kg)   LMP 12/19/2018   BMI 38.76 kg/m   General Appearance:    Alert, cooperative, no distress, appears stated age  Head:    Normocephalic, without obvious abnormality, atraumatic  Eyes:    PERRL, conjunctiva/corneas clear, EOM's intact  Ears:    Normal TM's and external ear canals  Nose:   Mask in place   Throat:   Mask in place   Neck:   Supple, no lymphadenopathy;  thyroid:  no   enlargement/tenderness/nodules; no carotid   bruit or JVD  Back:    Spine nontender, no curvature, ROM normal, no CVA     tenderness  Lungs:     Clear to auscultation bilaterally without wheezes, rales or     ronchi; respirations unlabored  Chest Wall:    No tenderness or deformity   Heart:    Regular rate and rhythm, S1 and S2 normal, no murmur, rub   or gallop  Breast Exam:    OB/GYN  Abdomen:     Soft, non-tender,  nondistended, normoactive bowel sounds,    no masses, no hepatosplenomegaly  Genitalia:    OB/GYN     Extremities:   No clubbing, cyanosis or edema  Pulses:   2+ and symmetric all extremities  Skin:   Skin color, texture, turgor normal, right forearm with a dry patches, some scaling. Nails on right hand are thickened, yellowish and shapes are altered  Lymph nodes:   Cervical, supraclavicular, and axillary nodes normal  Neurologic:   CNII-XII intact, normal strength, sensation and gait; reflexes 2+ and symmetric throughout          Psych:   Normal mood, affect, hygiene and grooming.         Assessment & Plan:  Routine general medical examination at a health care facility - Plan: CBC with Differential, Comprehensive metabolic panel, TSH, T4, Free, Lipid Panel Here today for a fasting CPE. This is her first CPE in at least 3-4 years.  Preventive health care reviewed. She will see an OB/GYN next month. Colonoscopy recommended next year when she turns 49. Immunizations reviewed and flu shot given. Discussed safety and health promotion. Counseling on healthy lifestyle.   History of sleep apnea- reports this resolved after uvula removed   Obesity (BMI 30-39.9) - Plan: Hemoglobin A1c, TSH, T4, Free, Lipid Panel -in depth counseling on potential health consequences associated with obesity. Counseling on healthy diet changes and increasing physical activity for weight loss.   Exercise-induced asthma- no flare ups recently. She has an inhaler if needed   Prediabetes - Plan: Hemoglobin A1c- check A1c and follow up. Discussed healthy diet and exercise to help prevent diabetes   Rheumatoid arthritis, involving unspecified site, unspecified whether rheumatoid factor present [facility])- managed by rheumatologist   Depression, unspecified depression type- managed by therapist and psychiatrist. She appears to be doing well on current medications. Continue therapy.    Rash- possibe psoriasis. Refer to  dermatologist. She will call to schedule.   Nail deformity- differentials include psoriasis vs fungal.  Refer to dermatology.   Vitamin D deficiency - Plan: Vitamin D, 25-hydroxy -check vitamin D level and start on supplement if needed   Screening for thyroid disorder  Screening for lipid disorders - Plan: Lipid Panel  Needs flu shot - Plan: Flu Vaccine QUAD 36+ mos PF IM (Fluarix & Fluzone Quad PF) -discussed potential side effects

## 2019-01-08 NOTE — Patient Instructions (Addendum)
I recommend scheduling with a dermatologist. Dr. Ledell Peoples office is one good option.   It is recommended that you get at least 150 minutes of physical activity per week.    Preventive Care 78-49 Years Old, Female Preventive care refers to visits with your health care provider and lifestyle choices that can promote health and wellness. This includes:  A yearly physical exam. This may also be called an annual well check.  Regular dental visits and eye exams.  Immunizations.  Screening for certain conditions.  Healthy lifestyle choices, such as eating a healthy diet, getting regular exercise, not using drugs or products that contain nicotine and tobacco, and limiting alcohol use. What can I expect for my preventive care visit? Physical exam Your health care provider will check your:  Height and weight. This may be used to calculate body mass index (BMI), which tells if you are at a healthy weight.  Heart rate and blood pressure.  Skin for abnormal spots. Counseling Your health care provider may ask you questions about your:  Alcohol, tobacco, and drug use.  Emotional well-being.  Home and relationship well-being.  Sexual activity.  Eating habits.  Work and work Statistician.  Method of birth control.  Menstrual cycle.  Pregnancy history. What immunizations do I need?  Influenza (flu) vaccine  This is recommended every year. Tetanus, diphtheria, and pertussis (Tdap) vaccine  You may need a Td booster every 10 years. Varicella (chickenpox) vaccine  You may need this if you have not been vaccinated. Zoster (shingles) vaccine  You may need this after age 25. Measles, mumps, and rubella (MMR) vaccine  You may need at least one dose of MMR if you were born in 1957 or later. You may also need a second dose. Pneumococcal conjugate (PCV13) vaccine  You may need this if you have certain conditions and were not previously vaccinated. Pneumococcal polysaccharide  (PPSV23) vaccine  You may need one or two doses if you smoke cigarettes or if you have certain conditions. Meningococcal conjugate (MenACWY) vaccine  You may need this if you have certain conditions. Hepatitis A vaccine  You may need this if you have certain conditions or if you travel or work in places where you may be exposed to hepatitis A. Hepatitis B vaccine  You may need this if you have certain conditions or if you travel or work in places where you may be exposed to hepatitis B. Haemophilus influenzae type b (Hib) vaccine  You may need this if you have certain conditions. Human papillomavirus (HPV) vaccine  If recommended by your health care provider, you may need three doses over 6 months. You may receive vaccines as individual doses or as more than one vaccine together in one shot (combination vaccines). Talk with your health care provider about the risks and benefits of combination vaccines. What tests do I need? Blood tests  Lipid and cholesterol levels. These may be checked every 5 years, or more frequently if you are over 23 years old.  Hepatitis C test.  Hepatitis B test. Screening  Lung cancer screening. You may have this screening every year starting at age 27 if you have a 30-pack-year history of smoking and currently smoke or have quit within the past 15 years.  Colorectal cancer screening. All adults should have this screening starting at age 49 and continuing until age 49. Your health care provider may recommend screening at age 68 if you are at increased risk. You will have tests every 1-10 years, depending on  your results and the type of screening test.  Diabetes screening. This is done by checking your blood sugar (glucose) after you have not eaten for a while (fasting). You may have this done every 1-3 years.  Mammogram. This may be done every 1-2 years. Talk with your health care provider about when you should start having regular mammograms. This may  depend on whether you have a family history of breast cancer.  BRCA-related cancer screening. This may be done if you have a family history of breast, ovarian, tubal, or peritoneal cancers.  Pelvic exam and Pap test. This may be done every 3 years starting at age 21. Starting at age 30, this may be done every 5 years if you have a Pap test in combination with an HPV test. Other tests  Sexually transmitted disease (STD) testing.  Bone density scan. This is done to screen for osteoporosis. You may have this scan if you are at high risk for osteoporosis. Follow these instructions at home: Eating and drinking  Eat a diet that includes fresh fruits and vegetables, whole grains, lean protein, and low-fat dairy.  Take vitamin and mineral supplements as recommended by your health care provider.  Do not drink alcohol if: ? Your health care provider tells you not to drink. ? You are pregnant, may be pregnant, or are planning to become pregnant.  If you drink alcohol: ? Limit how much you have to 0-1 drink a day. ? Be aware of how much alcohol is in your drink. In the [place] one drink equals one 12 oz bottle of beer (355 mL), one 5 oz glass of wine (148 mL), or one 1 oz glass of hard liquor (44 mL). Lifestyle  Take daily care of your teeth and gums.  Stay active. Exercise for at least 30 minutes on 5 or more days each week.  Do not use any products that contain nicotine or tobacco, such as cigarettes, e-cigarettes, and chewing tobacco. If you need help quitting, ask your health care provider.  If you are sexually active, practice safe sex. Use a condom or other form of birth control (contraception) in order to prevent pregnancy and STIs (sexually transmitted infections).  If told by your health care provider, take low-dose aspirin daily starting at age 50. What's next?  Visit your health care provider once a year for a well check visit.  Ask your health care provider how often you should  have your eyes and teeth checked.  Stay up to date on all vaccines. This information is not intended to replace advice given to you by your health care provider. Make sure you discuss any questions you have with your health care provider. Document Released: 01/31/2015 Document Revised: 09/15/2017 Document Reviewed: 09/15/2017 Elsevier Patient Education  2020 Elsevier Inc.  

## 2019-01-09 ENCOUNTER — Other Ambulatory Visit: Payer: Self-pay | Admitting: Family Medicine

## 2019-01-09 ENCOUNTER — Encounter: Payer: Self-pay | Admitting: Family Medicine

## 2019-01-09 DIAGNOSIS — E559 Vitamin D deficiency, unspecified: Secondary | ICD-10-CM

## 2019-01-09 DIAGNOSIS — E7800 Pure hypercholesterolemia, unspecified: Secondary | ICD-10-CM | POA: Insufficient documentation

## 2019-01-09 LAB — COMPREHENSIVE METABOLIC PANEL
ALT: 20 IU/L (ref 0–32)
AST: 21 IU/L (ref 0–40)
Albumin/Globulin Ratio: 1.5 (ref 1.2–2.2)
Albumin: 4.2 g/dL (ref 3.8–4.8)
Alkaline Phosphatase: 83 IU/L (ref 39–117)
BUN/Creatinine Ratio: 14 (ref 9–23)
BUN: 13 mg/dL (ref 6–24)
Bilirubin Total: 0.5 mg/dL (ref 0.0–1.2)
CO2: 21 mmol/L (ref 20–29)
Calcium: 9 mg/dL (ref 8.7–10.2)
Chloride: 101 mmol/L (ref 96–106)
Creatinine, Ser: 0.96 mg/dL (ref 0.57–1.00)
GFR calc Af Amer: 80 mL/min/{1.73_m2} (ref >59)
GFR calc non Af Amer: 70 mL/min/{1.73_m2} (ref >59)
Globulin, Total: 2.8 g/dL (ref 1.5–4.5)
Glucose: 115 mg/dL — ABNORMAL HIGH (ref 65–99)
Potassium: 4.7 mmol/L (ref 3.5–5.2)
Sodium: 136 mmol/L (ref 134–144)
Total Protein: 7 g/dL (ref 6.0–8.5)

## 2019-01-09 LAB — CBC WITH DIFFERENTIAL/PLATELET
Basophils Absolute: 0.1 10*3/uL (ref 0.0–0.2)
Basos: 1 %
EOS (ABSOLUTE): 0.2 10*3/uL (ref 0.0–0.4)
Eos: 2 %
Hematocrit: 40.6 % (ref 34.0–46.6)
Hemoglobin: 13.8 g/dL (ref 11.1–15.9)
Immature Grans (Abs): 0 10*3/uL (ref 0.0–0.1)
Immature Granulocytes: 0 %
Lymphocytes Absolute: 1.9 10*3/uL (ref 0.7–3.1)
Lymphs: 19 %
MCH: 29.3 pg (ref 26.6–33.0)
MCHC: 34 g/dL (ref 31.5–35.7)
MCV: 86 fL (ref 79–97)
Monocytes Absolute: 1.3 10*3/uL — ABNORMAL HIGH (ref 0.1–0.9)
Monocytes: 13 %
Neutrophils Absolute: 6.9 10*3/uL (ref 1.4–7.0)
Neutrophils: 65 %
Platelets: 316 10*3/uL (ref 150–450)
RBC: 4.71 x10E6/uL (ref 3.77–5.28)
RDW: 13 % (ref 11.7–15.4)
WBC: 10.5 10*3/uL (ref 3.4–10.8)

## 2019-01-09 LAB — T4, FREE: Free T4: 1.34 ng/dL (ref 0.82–1.77)

## 2019-01-09 LAB — VITAMIN D 25 HYDROXY (VIT D DEFICIENCY, FRACTURES): Vit D, 25-Hydroxy: 7.7 ng/mL — ABNORMAL LOW (ref 30.0–100.0)

## 2019-01-09 LAB — LIPID PANEL
Chol/HDL Ratio: 3.8 ratio (ref 0.0–4.4)
Cholesterol, Total: 188 mg/dL (ref 100–199)
HDL: 49 mg/dL (ref >39)
LDL Chol Calc (NIH): 118 mg/dL — ABNORMAL HIGH (ref 0–99)
Triglycerides: 115 mg/dL (ref 0–149)
VLDL Cholesterol Cal: 21 mg/dL (ref 5–40)

## 2019-01-09 LAB — TSH: TSH: 1.74 u[IU]/mL (ref 0.450–4.500)

## 2019-01-09 LAB — HEMOGLOBIN A1C
Est. average glucose Bld gHb Est-mCnc: 117 mg/dL
Hgb A1c MFr Bld: 5.7 % — ABNORMAL HIGH (ref 4.8–5.6)

## 2019-01-09 MED ORDER — VITAMIN D (ERGOCALCIFEROL) 1.25 MG (50000 UNIT) PO CAPS: 50000.0000 [IU] | ORAL_CAPSULE | ORAL | 0 refills | Status: DC

## 2019-01-09 NOTE — Progress Notes (Signed)
Her vitamin D is severely low. I will send in a prescription vitamin D for her to take once weekly for the next 8 weeks. Her blood sugars are in the prediabetes range and her LDL or bad cholesterol is elevated.  Otherwise, her labs are fine.  I recommend getting serious about cutting back on sugar, carbohydrates and fatty foods. Increase whole grains, fresh fruit, vegetables and lean meats or fish that is baked, grilled or broiled and not fried. Increasing physical activity can help with her blood sugar and cholesterol as well. I recommend a lab visit in 8 weeks to recheck her vitamin D. Order is in.

## 2019-01-19 DIAGNOSIS — N840 Polyp of corpus uteri: Secondary | ICD-10-CM

## 2019-01-19 DIAGNOSIS — N83201 Unspecified ovarian cyst, right side: Secondary | ICD-10-CM

## 2019-01-19 HISTORY — DX: Unspecified ovarian cyst, right side: N83.201

## 2019-01-19 HISTORY — DX: Polyp of corpus uteri: N84.0

## 2019-01-24 ENCOUNTER — Encounter: Payer: Self-pay | Admitting: Obstetrics and Gynecology

## 2019-01-24 ENCOUNTER — Other Ambulatory Visit: Payer: Self-pay

## 2019-01-24 ENCOUNTER — Telehealth: Payer: Self-pay | Admitting: Obstetrics and Gynecology

## 2019-01-24 ENCOUNTER — Ambulatory Visit (INDEPENDENT_AMBULATORY_CARE_PROVIDER_SITE_OTHER): Payer: BC Managed Care – PPO | Admitting: Obstetrics and Gynecology

## 2019-01-24 VITALS — BP 122/80 | HR 80 | Temp 95.0°F | Resp 14 | Ht 62.0 in | Wt 224.0 lb

## 2019-01-24 DIAGNOSIS — Z1211 Encounter for screening for malignant neoplasm of colon: Secondary | ICD-10-CM

## 2019-01-24 DIAGNOSIS — Z01419 Encounter for gynecological examination (general) (routine) without abnormal findings: Secondary | ICD-10-CM

## 2019-01-24 DIAGNOSIS — N6001 Solitary cyst of right breast: Secondary | ICD-10-CM

## 2019-01-24 DIAGNOSIS — N921 Excessive and frequent menstruation with irregular cycle: Secondary | ICD-10-CM

## 2019-01-24 NOTE — Progress Notes (Signed)
49 y.o. G0P0000 Single Caucasian female here for annual exam.    Patient complaining of hot flashes and sweating. Requesting possible hormone testing. She states she is having a lot of sweating during the day and night.  On Wellbutrin for 1 month, Cymbalta for 6 months.   Menses are irregular.  Never the same time every month.  Menses occur every 21 - 31 days.  She spots in between her menses, but not always.  This occurs about 2 times per year. Has heavy cycles lasting 3 - 4 days.  She changes pad every 30 - 60 minutes at times.  She is feeling tired.  She has some back pain with her cycles.  The cramping is not bad.   Normal TFTs and CBC on 01/08/19.  She is on short disability for depression.  Works in Teacher, early years/pre for [employer].  PCP:  Hetty Blend, NP-C  Patient's last menstrual period was 01/23/2019 (exact date).          Sexually active: No.  The current method of family planning is abstinence.    Exercising: No.  The patient does not participate in regular exercise at present. Smoker:  Former--quit 2002  Health Maintenance: Pap: 07-31-10 Neg History of abnormal Pap:  no MMG:  06-13-13 Rt.Br.poss.mass, Lt.Br.neg/density B;Diag Rt.w/US revealed prob.cyst vs solid mass at 10 o'clock. Rt.Br.diag.cyst aspiration demonstrated lesion to represent a cyst. Given thick walled nature of cyst, 6 mo.f/u Rt.Br.MMG & poss. [place] recommended. Colonoscopy:  n/a BMD:   n/a  Result  n/a TDaP: ?within 10 years Gardasil:   no HIV: no Hep C:no Screening Labs:  PCP.  Flu vaccine:  Completed.   reports that she quit smoking about 19 years ago. She has a 16.00 pack-year smoking history. She has never used smokeless tobacco. She reports current alcohol use of about 1.0 standard drinks of alcohol per week. She reports that she does not use drugs.  Past Medical History:  Diagnosis Date  . Depression   . Elevated LDL cholesterol level   . Exercise-induced asthma   . Kidney stones   .  Prediabetes   . Rheumatoid arthritis (HCC)   . Urinary incontinence   . Vitamin D deficiency     Past Surgical History:  Procedure Laterality Date  . GALLBLADDER SURGERY    . KIDNEY STONE SURGERY    . PALATE / UVULA BIOPSY / EXCISION    . shoulder surgery     . TONSILLECTOMY      Current Outpatient Medications  Medication Sig Dispense Refill  . albuterol (VENTOLIN HFA) 108 (90 Base) MCG/ACT inhaler Inhale 2 puffs into the lungs 4 (four) times daily as needed.    Marland Kitchen buPROPion (WELLBUTRIN XL) 150 MG 24 hr tablet Take one tablet every morning x 7 days, then take two tablets every morning. (Patient taking differently: Take 300 mg by mouth daily. Take one tablet every morning x 7 days, then take two tablets every morning.) 60 tablet 2  . DULoxetine (CYMBALTA) 60 MG capsule Take 60 mg by mouth daily.    Marland Kitchen etanercept (ENBREL) 50 MG/ML injection Inject 50 mg into the skin once a week.    . fexofenadine (ALLEGRA) 180 MG tablet Take 180 mg by mouth daily.    . folic acid (FOLVITE) 1 MG tablet Take 1 mg by mouth daily.    . methotrexate 50 MG/2ML injection Inject 50 mg into the muscle once a week.    . Vitamin D, Ergocalciferol, (DRISDOL) 1.25 MG (50000  UT) CAPS capsule Take 1 capsule (50,000 Units total) by mouth every 7 (seven) days. 8 capsule 0   No current facility-administered medications for this visit.    Family History  Problem Relation Age of Onset  . Diabetes Mother   . Hypothyroidism Mother   . Heart Problems Mother        pacemaker   . Cancer Mother        endometrial ca?  . Thyroid disease Mother   . Hypertension Father   . Diabetes Father   . Non-Hodgkin's lymphoma Paternal Grandmother     Review of Systems  All other systems reviewed and are negative.   Exam:   BP 122/80 (Cuff Size: Large)   Pulse 80   Temp (!) 95 F (35 C) (Temporal)   Resp 14   Ht 5\' 2"  (1.575 m)   Wt 224 lb (101.6 kg)   LMP 01/23/2019 (Exact Date)   BMI 40.97 kg/m     General  appearance: alert, cooperative and appears stated age Head: normocephalic, without obvious abnormality, atraumatic Neck: no adenopathy, supple, symmetrical, trachea midline and thyroid normal to inspection and palpation Lungs: clear to auscultation bilaterally Breasts: normal appearance, no masses or tenderness, No nipple retraction or dimpling, No nipple discharge or bleeding, No axillary adenopathy Heart: regular rate and rhythm Abdomen: soft, non-tender; no masses, no organomegaly Extremities: extremities normal, atraumatic, no cyanosis or edema Skin: skin color, texture, turgor normal. No rashes or lesions Lymph nodes: cervical, supraclavicular, and axillary nodes normal. Neurologic: grossly normal  Pelvic: External genitalia:  no lesions              No abnormal inguinal nodes palpated.              Urethra:  normal appearing urethra with no masses, tenderness or lesions              Bartholins and Skenes: normal                 Vagina: normal appearing vagina with normal color and discharge, no lesions. Vaginal bleeding noted.              Cervix: no lesions              Pap taken: No. Bimanual Exam:  Uterus:  normal size, contour, position, consistency, mobility, non-tender              Adnexa: no mass, fullness, tenderness              Rectal exam: Yes.  .  Confirms.              Anus:  normal sphincter tone, firmness at 12 o'clock.  Chaperone was present for exam.  Assessment:   Well woman visit with normal exam. Menorrhagia with irregular cycles. Fatigue.  Likely perimenopausal.  Depression.  On short term disability. Need for colon cancer screening.  ?hemorrhoids?  Plan: Mammogram screening discussed.  Will need to update dx mammogram and possible [place].  Triage will schedule this.  Self breast awareness reviewed. Pap and HR HPV not possible due to bleeding today. Guidelines for Calcium, Vitamin D, regular exercise program including cardiovascular and weight bearing  exercise. Referral for coloscopy.  Return for pelvic US and possible endometrial biopsy.  Will need to do pap when not bleeding.  Follow up annually and prn.   After visit summary provided.

## 2019-01-24 NOTE — Telephone Encounter (Signed)
Please contact patient to schedule [facility]  She had a thick walled cyst in 2015 which was aspirated.  She did not follow up for her 6 month recheck.

## 2019-01-25 NOTE — Telephone Encounter (Signed)
Spoke with Noelle at [facility]. Patient is scheduled for bilateral Dx MMG and right breast US, if needed, on 02/07/19 at 2pm, arrive at 1:40pm.   Spoke with patient, notified of appt. Patient is agreeable to date and time.   Placed in MMG hold.   Routing to provider for final review. Patient is agreeable to disposition. Will close encounter.

## 2019-01-27 NOTE — Patient Instructions (Signed)

## 2019-01-29 ENCOUNTER — Other Ambulatory Visit: Payer: Self-pay | Admitting: Obstetrics and Gynecology

## 2019-01-29 DIAGNOSIS — N921 Excessive and frequent menstruation with irregular cycle: Secondary | ICD-10-CM

## 2019-01-29 NOTE — Progress Notes (Signed)
Endometrial biopsy ordered to accompany sonohysterogram.

## 2019-02-01 ENCOUNTER — Encounter: Payer: Self-pay | Admitting: Adult Health

## 2019-02-01 ENCOUNTER — Other Ambulatory Visit: Payer: Self-pay

## 2019-02-01 ENCOUNTER — Ambulatory Visit (INDEPENDENT_AMBULATORY_CARE_PROVIDER_SITE_OTHER): Payer: BC Managed Care – PPO | Admitting: Adult Health

## 2019-02-01 DIAGNOSIS — F411 Generalized anxiety disorder: Secondary | ICD-10-CM | POA: Diagnosis not present

## 2019-02-01 DIAGNOSIS — G4700 Insomnia, unspecified: Secondary | ICD-10-CM

## 2019-02-01 DIAGNOSIS — F331 Major depressive disorder, recurrent, moderate: Secondary | ICD-10-CM

## 2019-02-01 DIAGNOSIS — R4689 Other symptoms and signs involving appearance and behavior: Secondary | ICD-10-CM

## 2019-02-01 DIAGNOSIS — R5383 Other fatigue: Secondary | ICD-10-CM

## 2019-02-01 DIAGNOSIS — R4189 Other symptoms and signs involving cognitive functions and awareness: Secondary | ICD-10-CM

## 2019-02-01 MED ORDER — ARIPIPRAZOLE 5 MG PO TABS: 5.0000 mg | ORAL_TABLET | Freq: Every day | ORAL | 2 refills | Status: DC

## 2019-02-01 NOTE — Progress Notes (Signed)
Olivia Sims [id number] Sep 13, 1970 49 y.o.  Subjective:   Patient ID:  Olivia Sims is a 49 y.o. (DOB 09-01-1970) female.  Chief Complaint:  Chief Complaint  Patient presents with  . Depression  . Anxiety  . Insomnia  . Other    Cognitive issues    HPI Olivia Sims presents to the office today for follow-up of MDD, GAD, insomnia, cognitive and behavioral issues, and fatigue.  Describes mood today as "ok". Pleasant. Flat. Tearful. Mood symptoms - depression, anxiety, and irritability. More depressed overall. Stating "I can't tell any difference in my mood with the adition of Wellbutrin. Has increased sweating and some "jerking in her sleep". Feels depressed 4 out of 7 days. Not exactly happy on the "better" days. Has a little more focus. RA has improved - "not as much trouble". Trying to get out some days. Does not feel like she is ready to return to work. Stable interest and motivation. Taking medications as prescribed.  Energy levels low. Active, does not have a regular exercise routine. 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 Is out of work currently.   Enjoys some usual interests and activities. Single. Lives with sister. Mother and father local. Enjoyed the holidays - "sad again after it was over".  Appetite adequate. Weight fluctuates within 5 pounds. Sleeps better some nights than others. Stating "I either sleep too much or not enough". Alsostating "there is no middle ground".  Focus and concentration difficulties. Completing tasks. Managing some aspects of household. Trying to give herself tasks to complete - has started reading a book and a few projects -"unable to complete anything".  Denies SI or HI. Denies AH or VH.  Past medications: Wellbutrin   PHQ2-9     Office Visit from 01/08/2019 in [facility] Office Visit from 12/28/2018 in [facility]  PHQ-2 Total Score  2  2  PHQ-9 Total Score  13  10      Review of  Systems:  Review of Systems  Musculoskeletal: Negative for gait problem.  Neurological: Negative for tremors.  Psychiatric/Behavioral:       Please refer to HPI    Medications: I have reviewed the patient's current medications.  Current Outpatient Medications  Medication Sig Dispense Refill  . albuterol (VENTOLIN HFA) 108 (90 Base) MCG/ACT inhaler Inhale 2 puffs into the lungs 4 (four) times daily as needed.    Marland Kitchen buPROPion (WELLBUTRIN XL) 150 MG 24 hr tablet Take one tablet every morning x 7 days, then take two tablets every morning. (Patient taking differently: Take 300 mg by mouth daily. Take one tablet every morning x 7 days, then take two tablets every morning.) 60 tablet 2  . DULoxetine (CYMBALTA) 60 MG capsule Take 60 mg by mouth daily.    Marland Kitchen etanercept (ENBREL) 50 MG/ML injection Inject 50 mg into the skin once a week.    . fexofenadine (ALLEGRA) 180 MG tablet Take 180 mg by mouth daily.    . folic acid (FOLVITE) 1 MG tablet Take 1 mg by mouth daily.    . methotrexate 50 MG/2ML injection Inject 50 mg into the muscle once a week.    . Vitamin D, Ergocalciferol, (DRISDOL) 1.25 MG (50000 UT) CAPS capsule Take 1 capsule (50,000 Units total) by mouth every 7 (seven) days. 8 capsule 0   No current facility-administered medications for this visit.    Medication Side Effects: None  Allergies: No Known Allergies  Past Medical History:  Diagnosis Date  . Depression   . Elevated LDL cholesterol level   . Exercise-induced asthma   . Kidney stones   . Prediabetes   . Rheumatoid arthritis (HCC)   . Urinary incontinence   . Vitamin D deficiency     Family History  Problem Relation Age of Onset  . Diabetes Mother   . Hypothyroidism Mother   . Heart Problems Mother        pacemaker   . Cancer Mother        endometrial ca?  . Thyroid disease Mother   . Hypertension Father   . Diabetes Father   . Non-Hodgkin's lymphoma Paternal Grandmother     Social History   Socioeconomic  History  . Marital status: Single    Spouse name: Not on file  . Number of children: Not on file  . Years of education: Not on file  . Highest education level: Not on file  Occupational History  . Not on file  Tobacco Use  . Smoking status: Former Smoker    Packs/day: 1.00    Years: 16.00    Pack years: 16.00    Quit date: 2002    Years since quitting: 19.0  . Smokeless tobacco: Never Used  Substance and Sexual Activity  . Alcohol use: Yes    Alcohol/week: 1.0 standard drinks    Types: 1 Glasses of wine per week    Comment: once a week  . Drug use: Never  . Sexual activity: Not Currently    Birth control/protection: Abstinence  Other Topics Concern  . Not on file  Social History Narrative  . Not on file   Social Determinants of Health   Financial Resource Strain:   . Difficulty of Paying Living Expenses: Not on file  Food Insecurity:   . Worried About Programme researcher, broadcasting/film/video in the Last Year: Not on file  . Ran Out of Food in the Last Year: Not on file  Transportation Needs:   . Lack of Transportation (Medical): Not on file  . Lack of Transportation (Non-Medical): Not on file  Physical Activity:   . Days of Exercise per Week: Not on file  . Minutes of Exercise per Session: Not on file  Stress:   . Feeling of Stress : Not on file  Social Connections:   . Frequency of Communication with Friends and Family: Not on file  . Frequency of Social Gatherings with Friends and Family: Not on file  . Attends Religious Services: Not on file  . Active Member of Clubs or Organizations: Not on file  . Attends Banker Meetings: Not on file  . Marital Status: Not on file  Intimate Partner Violence:   . Fear of Current or Ex-Partner: Not on file  . Emotionally Abused: Not on file  . Physically Abused: Not on file  . Sexually Abused: Not on file    Past Medical History, Surgical history, Social history, and Family history were reviewed and updated as appropriate.    Please see review of systems for further details on the patient's review from today.   Objective:   Physical Exam:  LMP 01/23/2019 (Exact Date)   Physical Exam Constitutional:      General: She is not in acute distress.    Appearance: She is well-developed.  Musculoskeletal:        General: No deformity.  Neurological:     Mental Status: She is alert and oriented to person, place, and time.  Coordination: Coordination normal.  Psychiatric:        Attention and Perception: Attention and perception normal. She does not perceive auditory or visual hallucinations.        Mood and Affect: Mood normal. Mood is not anxious or depressed. Affect is not labile, blunt, angry or inappropriate.        Speech: Speech normal.        Behavior: Behavior normal.        Thought Content: Thought content normal. Thought content is not paranoid or delusional. Thought content does not include homicidal or suicidal ideation. Thought content does not include homicidal or suicidal plan.        Cognition and Memory: Cognition and memory normal.        Judgment: Judgment normal.     Comments: Insight intact     Lab Review:     Component Value Date/Time   NA 136 01/08/2019 0914   K 4.7 01/08/2019 0914   CL 101 01/08/2019 0914   CO2 21 01/08/2019 0914   GLUCOSE 115 (H) 01/08/2019 0914   GLUCOSE 110 (H) 09/25/2008 1101   BUN 13 01/08/2019 0914   CREATININE 0.96 01/08/2019 0914   CALCIUM 9.0 01/08/2019 0914   PROT 7.0 01/08/2019 0914   ALBUMIN 4.2 01/08/2019 0914   AST 21 01/08/2019 0914   ALT 20 01/08/2019 0914   ALKPHOS 83 01/08/2019 0914   BILITOT 0.5 01/08/2019 0914   GFRNONAA 70 01/08/2019 0914   GFRAA 80 01/08/2019 0914       Component Value Date/Time   WBC 10.5 01/08/2019 0914   WBC 11.7 (H) 09/25/2008 1101   RBC 4.71 01/08/2019 0914   RBC 4.64 09/25/2008 1101   HGB 13.8 01/08/2019 0914   HCT 40.6 01/08/2019 0914   PLT 316 01/08/2019 0914   MCV 86 01/08/2019 0914   MCH 29.3  01/08/2019 0914   MCHC 34.0 01/08/2019 0914   MCHC 33.7 09/25/2008 1101   RDW 13.0 01/08/2019 0914   LYMPHSABS 1.9 01/08/2019 0914   EOSABS 0.2 01/08/2019 0914   BASOSABS 0.1 01/08/2019 0914    No results found for: POCLITH, LITHIUM   No results found for: PHENYTOIN, PHENOBARB, VALPROATE, CBMZ   .res Assessment: Plan:    Plan:  1. Decrease Wellbutrin XL 300mg  daily to 150mg  daily x 7 days, then discontinue - denies seizure history.  2. Continue Cymbalta 60mg  capsule 3. Add Abilify 5mg  - 1/2 tab daily x 7 days, then one tablet daily  Patient to remain out of work at this time. Out of work 01/31/2018 through 02/28/2018. Will assess a RTW date at next visit.   RTC 4 weeks  Patient advised to contact office with any questions, adverse effects, or acute worsening in signs and symptoms.  Discussed potential metabolic side effects associated with atypical antipsychotics, as well as potential risk for movement side effects. Advised pt to contact office if movement side effects occur.    Olivia Sims was seen today for depression, anxiety, insomnia and other.  Diagnoses and all orders for this visit:  Fatigue, unspecified type  Insomnia, unspecified type  Generalized anxiety disorder  Major depressive disorder, recurrent episode, moderate (HCC)  Cognitive and behavioral changes    mmary for patient specific instructions.  Future Appointments  Date Time Provider [place]  02/07/2019  2:00 PM [facility] DIAG [place] 1 [facility]  02/07/2019  2:10 PM [facility] [place] 1 [facility]  03/12/2019  8:30 AM [facility] NURSE [facility]  01/09/2020  9:30 AM Henson, Vickie L, NP-C P[facility]    No orders of the defined types were placed in this encounter.   -------------------------------

## 2019-02-01 NOTE — Addendum Note (Signed)
Addended by: Dorothyann Gibbs on: 02/01/2019 09:04 AM   Modules accepted: Orders

## 2019-02-06 ENCOUNTER — Telehealth: Payer: Self-pay | Admitting: Obstetrics and Gynecology

## 2019-02-06 DIAGNOSIS — N921 Excessive and frequent menstruation with irregular cycle: Secondary | ICD-10-CM

## 2019-02-06 NOTE — Telephone Encounter (Signed)
Call placed to patient to review benefit for sonohysterogram and endometrial biopsy. Left voicemail message requesting a return call

## 2019-02-07 ENCOUNTER — Ambulatory Visit
Admission: RE | Admit: 2019-02-07 | Discharge: 2019-02-07 | Disposition: A | Discharge status: Home / Self Care | Payer: BC Managed Care – PPO | Source: Ambulatory Visit | Attending: Obstetrics and Gynecology | Admitting: Obstetrics and Gynecology

## 2019-02-07 ENCOUNTER — Other Ambulatory Visit: Payer: Self-pay

## 2019-02-07 DIAGNOSIS — N6001 Solitary cyst of right breast: Secondary | ICD-10-CM

## 2019-02-07 DIAGNOSIS — N6489 Other specified disorders of breast: Secondary | ICD-10-CM | POA: Diagnosis not present

## 2019-02-07 DIAGNOSIS — R928 Other abnormal and inconclusive findings on diagnostic imaging of breast: Secondary | ICD-10-CM | POA: Diagnosis not present

## 2019-02-14 DIAGNOSIS — Z0289 Encounter for other administrative examinations: Secondary | ICD-10-CM

## 2019-02-14 NOTE — Telephone Encounter (Signed)
Second call placed to patient to scheduled recommended benefit for recommended ultrasound with possible sonohystergram and endometrial biopsy. Left voicemail message requesting a return call.

## 2019-02-14 NOTE — Telephone Encounter (Signed)
Patient returned call. Reviewed benefit for ultrasound with possible sonohysterogram and endometrial biopsy. Patient advises she will call back to scheduled. See account notes for details.    cc: Billie Ruddy, RN

## 2019-02-21 ENCOUNTER — Telehealth: Payer: Self-pay | Admitting: Family Medicine

## 2019-02-21 NOTE — Telephone Encounter (Signed)
Requested records received from [facility].  

## 2019-02-23 DIAGNOSIS — Z79899 Other long term (current) drug therapy: Secondary | ICD-10-CM | POA: Diagnosis not present

## 2019-02-23 DIAGNOSIS — M0579 Rheumatoid arthritis with rheumatoid factor of multiple sites without organ or systems involvement: Secondary | ICD-10-CM | POA: Diagnosis not present

## 2019-02-23 DIAGNOSIS — M2550 Pain in unspecified joint: Secondary | ICD-10-CM | POA: Diagnosis not present

## 2019-03-01 ENCOUNTER — Other Ambulatory Visit: Payer: Self-pay

## 2019-03-01 ENCOUNTER — Encounter: Payer: Self-pay | Admitting: Adult Health

## 2019-03-01 ENCOUNTER — Ambulatory Visit (INDEPENDENT_AMBULATORY_CARE_PROVIDER_SITE_OTHER): Payer: BC Managed Care – PPO | Admitting: Adult Health

## 2019-03-01 DIAGNOSIS — R4189 Other symptoms and signs involving cognitive functions and awareness: Secondary | ICD-10-CM

## 2019-03-01 DIAGNOSIS — F411 Generalized anxiety disorder: Secondary | ICD-10-CM | POA: Diagnosis not present

## 2019-03-01 DIAGNOSIS — F331 Major depressive disorder, recurrent, moderate: Secondary | ICD-10-CM | POA: Diagnosis not present

## 2019-03-01 DIAGNOSIS — G4700 Insomnia, unspecified: Secondary | ICD-10-CM

## 2019-03-01 DIAGNOSIS — R5383 Other fatigue: Secondary | ICD-10-CM

## 2019-03-01 DIAGNOSIS — R4689 Other symptoms and signs involving appearance and behavior: Secondary | ICD-10-CM

## 2019-03-01 MED ORDER — ARIPIPRAZOLE 5 MG PO TABS: 5.0000 mg | ORAL_TABLET | Freq: Every day | ORAL | 2 refills | Status: DC

## 2019-03-01 NOTE — Progress Notes (Signed)
Olivia Sims [id number] 1970-02-28 49 y.o.  Subjective:   Patient ID:  Olivia Sims is a 49 y.o. (DOB 1970/03/24) female.  Chief Complaint:  Chief Complaint  Patient presents with  . Anxiety  . Depression  . Insomnia  . Other    Cognitive issues,   . Fatigue    HPI Olivia Sims presents to the office today for follow-up of MDD, GAD, insomnia, cognitive and behavioral issues, and fatigue.  Describes mood today as "ok". Pleasant. Flat. Tearful - "of and on". Having good days and bad days. Mood symptoms - reports depression, anxiety, and irritability. More depressed overall. Decreased concentration with reducing the Wellbutrin. Has not been able to start Abilify due to insurance issues. Decreased sweating and some "jerking" in her sleep" with the decrease in Wellbutrin. Feels depressed 4 out of 7 days - "about the same". Having a few "good days". Getting out and driving around some. Trying to get out on weekends with sister and shop for antiques. RA has worsened - "out of Enbrel". Having some "flares". Does not feel like she is ready to return to work. Working with therapist and plans to start group therapy. Stable interest and motivation. Taking medications as prescribed.  Energy levels remain low. Active, does not have a regular exercise routine. 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 Is out of work currently.   Enjoys some usual interests and activities. Single. Lives with sister. Mother and father local.  Appetite adequate. Weight loss - 5 pounds. Sleeps better some nights than others. Averages 4 to 5 hours or 10 to 11 hours - "depends on the day".  Focus and concentration difficulties. Stating "I can't stay focused". Also stating "I can't remember the little things". Having to write things down so she doesn't forget them. Completing tasks. Managing some aspects of household - doing little projects "here and there, not finishing anything". Has been reading  "some" - making an effort. Trying to find something that interests here so she can stay "engulfed" in it.  Denies SI or HI. Denies AH or VH.  Past medications: Wellbutrin  PHQ2-9     Office Visit from 01/08/2019 in [facility] Office Visit from 12/28/2018 in [facility]  PHQ-2 Total Score  2  2  PHQ-9 Total Score  13  10       Review of Systems:  Review of Systems  Musculoskeletal: Negative for gait problem.  Neurological: Negative for tremors.  Psychiatric/Behavioral:       Please refer to HPI    Medications: I have reviewed the patient's current medications.  Current Outpatient Medications  Medication Sig Dispense Refill  . albuterol (VENTOLIN HFA) 108 (90 Base) MCG/ACT inhaler Inhale 2 puffs into the lungs 4 (four) times daily as needed.    . ARIPiprazole (ABILIFY) 5 MG tablet Take 1 tablet (5 mg total) by mouth daily. 30 tablet 2  . buPROPion (WELLBUTRIN XL) 150 MG 24 hr tablet Take one tablet every morning x 7 days, then take two tablets every morning. (Patient taking differently: Take 50 mg by mouth daily. Take one tablet every morning x 7 days, then take two tablets every morning.) 60 tablet 2  . DULoxetine (CYMBALTA) 60 MG capsule Take 60 mg by mouth daily.    Marland Kitchen etanercept (ENBREL) 50 MG/ML injection Inject 50 mg into the skin once a week.    . fexofenadine (ALLEGRA) 180 MG tablet Take 180 mg by mouth daily.    Marland Kitchen  folic acid (FOLVITE) 1 MG tablet Take 1 mg by mouth daily.    . methotrexate 50 MG/2ML injection Inject 50 mg into the muscle once a week.    . Vitamin D, Ergocalciferol, (DRISDOL) 1.25 MG (50000 UT) CAPS capsule Take 1 capsule (50,000 Units total) by mouth every 7 (seven) days. 8 capsule 0   No current facility-administered medications for this visit.    Medication Side Effects: None  Allergies: No Known Allergies  Past Medical History:  Diagnosis Date  . Depression   . Elevated LDL cholesterol level   . Exercise-induced asthma    . Kidney stones   . Prediabetes   . Rheumatoid arthritis (HCC)   . Urinary incontinence   . Vitamin D deficiency     Family History  Problem Relation Age of Onset  . Diabetes Mother   . Hypothyroidism Mother   . Heart Problems Mother        pacemaker   . Cancer Mother        endometrial ca?  . Thyroid disease Mother   . Hypertension Father   . Diabetes Father   . Non-Hodgkin's lymphoma Paternal Grandmother     Social History   Socioeconomic History  . Marital status: Single    Spouse name: Not on file  . Number of children: Not on file  . Years of education: Not on file  . Highest education level: Not on file  Occupational History  . Not on file  Tobacco Use  . Smoking status: Former Smoker    Packs/day: 1.00    Years: 16.00    Pack years: 16.00    Quit date: 2002    Years since quitting: 19.1  . Smokeless tobacco: Never Used  Substance and Sexual Activity  . Alcohol use: Yes    Alcohol/week: 1.0 standard drinks    Types: 1 Glasses of wine per week    Comment: once a week  . Drug use: Never  . Sexual activity: Not Currently    Birth control/protection: Abstinence  Other Topics Concern  . Not on file  Social History Narrative  . Not on file   Social Determinants of Health   Financial Resource Strain:   . Difficulty of Paying Living Expenses: Not on file  Food Insecurity:   . Worried About Programme researcher, broadcasting/film/video in the Last Year: Not on file  . Ran Out of Food in the Last Year: Not on file  Transportation Needs:   . Lack of Transportation (Medical): Not on file  . Lack of Transportation (Non-Medical): Not on file  Physical Activity:   . Days of Exercise per Week: Not on file  . Minutes of Exercise per Session: Not on file  Stress:   . Feeling of Stress : Not on file  Social Connections:   . Frequency of Communication with Friends and Family: Not on file  . Frequency of Social Gatherings with Friends and Family: Not on file  . Attends Religious  Services: Not on file  . Active Member of Clubs or Organizations: Not on file  . Attends Banker Meetings: Not on file  . Marital Status: Not on file  Intimate Partner Violence:   . Fear of Current or Ex-Partner: Not on file  . Emotionally Abused: Not on file  . Physically Abused: Not on file  . Sexually Abused: Not on file    Past Medical History, Surgical history, Social history, and Family history were reviewed and updated as appropriate.  Please see review of systems for further details on the patient's review from today.   Objective:   Physical Exam:  There were no vitals taken for this visit.  Physical Exam Constitutional:      General: She is not in acute distress.    Appearance: She is well-developed.  Musculoskeletal:        General: No deformity.  Neurological:     Mental Status: She is alert and oriented to person, place, and time.     Coordination: Coordination normal.  Psychiatric:        Attention and Perception: Attention and perception normal. She does not perceive auditory or visual hallucinations.        Mood and Affect: Mood is anxious and depressed. Affect is not labile, blunt, angry or inappropriate.        Speech: Speech normal.        Behavior: Behavior normal.        Thought Content: Thought content normal. Thought content is not paranoid or delusional. Thought content does not include homicidal or suicidal ideation. Thought content does not include homicidal or suicidal plan.        Cognition and Memory: Cognition and memory normal.        Judgment: Judgment normal.     Comments: Insight intact     Lab Review:     Component Value Date/Time   NA 136 01/08/2019 0914   K 4.7 01/08/2019 0914   CL 101 01/08/2019 0914   CO2 21 01/08/2019 0914   GLUCOSE 115 (H) 01/08/2019 0914   GLUCOSE 110 (H) 09/25/2008 1101   BUN 13 01/08/2019 0914   CREATININE 0.96 01/08/2019 0914   CALCIUM 9.0 01/08/2019 0914   PROT 7.0 01/08/2019 0914    ALBUMIN 4.2 01/08/2019 0914   AST 21 01/08/2019 0914   ALT 20 01/08/2019 0914   ALKPHOS 83 01/08/2019 0914   BILITOT 0.5 01/08/2019 0914   GFRNONAA 70 01/08/2019 0914   GFRAA 80 01/08/2019 0914       Component Value Date/Time   WBC 10.5 01/08/2019 0914   WBC 11.7 (H) 09/25/2008 1101   RBC 4.71 01/08/2019 0914   RBC 4.64 09/25/2008 1101   HGB 13.8 01/08/2019 0914   HCT 40.6 01/08/2019 0914   PLT 316 01/08/2019 0914   MCV 86 01/08/2019 0914   MCH 29.3 01/08/2019 0914   MCHC 34.0 01/08/2019 0914   MCHC 33.7 09/25/2008 1101   RDW 13.0 01/08/2019 0914   LYMPHSABS 1.9 01/08/2019 0914   EOSABS 0.2 01/08/2019 0914   BASOSABS 0.1 01/08/2019 0914    No results found for: POCLITH, LITHIUM   No results found for: PHENYTOIN, PHENOBARB, VALPROATE, CBMZ   .res Assessment: Plan:    Plan:  Continue Wellbutrin XL 150mg  daily - stop when starting Abilify  Continue Cymbalta 60mg  capsule Add Abilify 5mg  - 1/2 tab daily x 7 days, then one tablet daily - has not been able to get medication filled.  Working with individual therapist - planning to start group therapy.  Patient to remain out of work at this time. Out of work 02/28/2018 through 03/29/2018. Will assess a RTW date at next visit.   RTC 4 weeks  Patient advised to contact office with any questions, adverse effects, or acute worsening in signs and symptoms.  Discussed potential metabolic side effects associated with atypical antipsychotics, as well as potential risk for movement side effects. Advised pt to contact office if movement side effects occur.  Olivia Sims was seen today for anxiety, depression, insomnia, other and fatigue.  Diagnoses and all orders for this visit:  Cognitive and behavioral changes -     ARIPiprazole (ABILIFY) 5 MG tablet; Take 1 tablet (5 mg total) by mouth daily.  Major depressive disorder, recurrent episode, moderate (HCC) -     ARIPiprazole (ABILIFY) 5 MG tablet; Take 1 tablet (5 mg total) by  mouth daily.  Generalized anxiety disorder -     ARIPiprazole (ABILIFY) 5 MG tablet; Take 1 tablet (5 mg total) by mouth daily.  Insomnia, unspecified type  Fatigue, unspecified type     Please see After Visit Summary for patient specific instructions.  Future Appointments  Date Time Provider [place]  03/12/2019  8:30 AM [facility] NURSE [facility]  01/09/2020  9:30 AM Henson, Vickie L, NP-C [facility]    No orders of the defined types were placed in this encounter.   -------------------------------

## 2019-03-12 ENCOUNTER — Other Ambulatory Visit: Payer: BC Managed Care – PPO

## 2019-03-12 DIAGNOSIS — Z0289 Encounter for other administrative examinations: Secondary | ICD-10-CM

## 2019-03-13 NOTE — Telephone Encounter (Signed)
Left message to call Khristy Kalan, RN at [facility] [phone].   

## 2019-03-20 ENCOUNTER — Telehealth: Payer: Self-pay | Admitting: Adult Health

## 2019-03-20 NOTE — Telephone Encounter (Signed)
Spoke with patient, patient agreeable to PUS date and time. OV, possible EMB scheduled for 3/8 at 11am with Dr. Edward Jolly. Patient will be out of town 3/9 -3/12. Patient verbalizes understanding and is agreeable.   Routing to provider for final review. Patient is agreeable to disposition. Will close encounter.  Cc: Hayley Carder

## 2019-03-20 NOTE — Telephone Encounter (Signed)
Pt is having issues w/ RX Abilify. She would like a call back to discuss these issues.  Call Back # [phone]

## 2019-03-20 NOTE — Telephone Encounter (Signed)
Spoke with patient. Reviewed options for PUS, patient request to schedule PUS at [facility]. Order placed for US pelvic complete with transvaginal. RN will call to schedule and return call with appt details and schedule consult with Dr. Edward Jolly. Patient agreeable.   Spoke with Alcario Drought at [facility] Patient scheduled for PUS on 03/22/19, arrive at 3pm, appt at 3:15pm. [address] location. Drink 32 oz of water 1 hour prior, hold bladder.   Call returned to patient, left detailed message, ok per dpr. Advised of appt as seen above and instructions. Return call to office to schedule consult with possible EMB with Dr. Edward Jolly.   Routing to Dr. Marjorie Smolder.   Cc: Hayley Carder

## 2019-03-21 NOTE — Telephone Encounter (Signed)
Patient aware to stop Abilify and call back once symptoms resolved. Discussed Rexulti with her and that she can try that prior to her apt or wait to discuss with Rene Kocher at her OV. She will call back with an update.

## 2019-03-21 NOTE — Telephone Encounter (Signed)
Have her stop it and call back in a few days with update and we can try something else - consider Rexulti 0.5mg  x 7 days, then 1 mg daily.

## 2019-03-22 ENCOUNTER — Ambulatory Visit
Admission: RE | Admit: 2019-03-22 | Discharge: 2019-03-22 | Disposition: A | Discharge status: Home / Self Care | Payer: BC Managed Care – PPO | Source: Ambulatory Visit | Attending: Obstetrics and Gynecology | Admitting: Obstetrics and Gynecology

## 2019-03-22 DIAGNOSIS — N921 Excessive and frequent menstruation with irregular cycle: Secondary | ICD-10-CM

## 2019-03-22 DIAGNOSIS — D250 Submucous leiomyoma of uterus: Secondary | ICD-10-CM | POA: Diagnosis not present

## 2019-03-23 ENCOUNTER — Other Ambulatory Visit: Payer: Self-pay

## 2019-03-23 DIAGNOSIS — M0579 Rheumatoid arthritis with rheumatoid factor of multiple sites without organ or systems involvement: Secondary | ICD-10-CM | POA: Diagnosis not present

## 2019-03-26 ENCOUNTER — Ambulatory Visit: Payer: BC Managed Care – PPO | Admitting: Obstetrics and Gynecology

## 2019-03-26 ENCOUNTER — Other Ambulatory Visit: Payer: Self-pay

## 2019-03-26 NOTE — Progress Notes (Deleted)
GYNECOLOGY  VISIT   HPI: 49 y.o.   Single  Caucasian  female   [id number] with No LMP recorded.   here to discuss pelvic ultrasound results and possible EMB.  GYNECOLOGIC HISTORY: No LMP recorded. Contraception: ***Abstinence Menopausal hormone therapy:  none Last mammogram: 02-07-19 Diag.Bil. w/Rt.Br.US/Simple cyst Rt.Br.10 o'clock,Lt.Br.Neg/density B/BiRads2/screening 67yr Last pap smear:  07-31-10 Neg        OB History    Gravida  0   Para  0   Term  0   Preterm  0   AB  0   Living  0     SAB  0   TAB  0   Ectopic  0   Multiple  0   Live Births  0              Patient Active Problem List   Diagnosis Date Noted  . Elevated LDL cholesterol level   . Obesity (BMI 30-39.9) 01/08/2019  . Prediabetes 01/08/2019  . Nail deformity 01/08/2019  . Vitamin D deficiency 01/08/2019  . Exercise-induced asthma   . Rheumatoid arthritis (HCC)   . Kidney stones   . Depression     Past Medical History:  Diagnosis Date  . Depression   . Elevated LDL cholesterol level   . Exercise-induced asthma   . Kidney stones   . Prediabetes   . Rheumatoid arthritis (HCC)   . Urinary incontinence   . Vitamin D deficiency     Past Surgical History:  Procedure Laterality Date  . GALLBLADDER SURGERY    . KIDNEY STONE SURGERY    . PALATE / UVULA BIOPSY / EXCISION    . shoulder surgery     . TONSILLECTOMY      Current Outpatient Medications  Medication Sig Dispense Refill  . albuterol (VENTOLIN HFA) 108 (90 Base) MCG/ACT inhaler Inhale 2 puffs into the lungs 4 (four) times daily as needed.    . ARIPiprazole (ABILIFY) 5 MG tablet Take 1 tablet (5 mg total) by mouth daily. 30 tablet 2  . buPROPion (WELLBUTRIN XL) 150 MG 24 hr tablet Take one tablet every morning x 7 days, then take two tablets every morning. (Patient taking differently: Take 300 mg by mouth daily. Take one tablet every morning x 7 days, then take two tablets every morning.) 60 tablet 2  . DULoxetine (CYMBALTA) 60  MG capsule Take 60 mg by mouth daily.    Marland Kitchen etanercept (ENBREL) 50 MG/ML injection Inject 50 mg into the skin once a week.    . fexofenadine (ALLEGRA) 180 MG tablet Take 180 mg by mouth daily.    . folic acid (FOLVITE) 1 MG tablet Take 1 mg by mouth daily.    . methotrexate 50 MG/2ML injection Inject 50 mg into the muscle once a week.    . Vitamin D, Ergocalciferol, (DRISDOL) 1.25 MG (50000 UT) CAPS capsule Take 1 capsule (50,000 Units total) by mouth every 7 (seven) days. 8 capsule 0   No current facility-administered medications for this visit.     ALLERGIES: Patient has no known allergies.  Family History  Problem Relation Age of Onset  . Diabetes Mother   . Hypothyroidism Mother   . Heart Problems Mother        pacemaker   . Cancer Mother        endometrial ca?  . Thyroid disease Mother   . Hypertension Father   . Diabetes Father   . Non-Hodgkin's lymphoma Paternal Grandmother  Social History   Socioeconomic History  . Marital status: Single    Spouse name: Not on file  . Number of children: Not on file  . Years of education: Not on file  . Highest education level: Not on file  Occupational History  . Not on file  Tobacco Use  . Smoking status: Former Smoker    Packs/day: 1.00    Years: 16.00    Pack years: 16.00    Quit date: 2002    Years since quitting: 19.1  . Smokeless tobacco: Never Used  Substance and Sexual Activity  . Alcohol use: Yes    Alcohol/week: 1.0 standard drinks    Types: 1 Glasses of wine per week    Comment: once a week  . Drug use: Never  . Sexual activity: Not Currently    Birth control/protection: Abstinence  Other Topics Concern  . Not on file  Social History Narrative  . Not on file   Social Determinants of Health   Financial Resource Strain:   . Difficulty of Paying Living Expenses: Not on file  Food Insecurity:   . Worried About Charity fundraiser in the Last Year: Not on file  . Ran Out of Food in the Last Year: Not on  file  Transportation Needs:   . Lack of Transportation (Medical): Not on file  . Lack of Transportation (Non-Medical): Not on file  Physical Activity:   . Days of Exercise per Week: Not on file  . Minutes of Exercise per Session: Not on file  Stress:   . Feeling of Stress : Not on file  Social Connections:   . Frequency of Communication with Friends and Family: Not on file  . Frequency of Social Gatherings with Friends and Family: Not on file  . Attends Religious Services: Not on file  . Active Member of Clubs or Organizations: Not on file  . Attends Archivist Meetings: Not on file  . Marital Status: Not on file  Intimate Partner Violence:   . Fear of Current or Ex-Partner: Not on file  . Emotionally Abused: Not on file  . Physically Abused: Not on file  . Sexually Abused: Not on file    Review of Systems  PHYSICAL EXAMINATION:    There were no vitals taken for this visit.    General appearance: alert, cooperative and appears stated age Head: Normocephalic, without obvious abnormality, atraumatic Neck: no adenopathy, supple, symmetrical, trachea midline and thyroid normal to inspection and palpation Lungs: clear to auscultation bilaterally Breasts: normal appearance, no masses or tenderness, No nipple retraction or dimpling, No nipple discharge or bleeding, No axillary or supraclavicular adenopathy Heart: regular rate and rhythm Abdomen: soft, non-tender, no masses,  no organomegaly Extremities: extremities normal, atraumatic, no cyanosis or edema Skin: Skin color, texture, turgor normal. No rashes or lesions Lymph nodes: Cervical, supraclavicular, and axillary nodes normal. No abnormal inguinal nodes palpated Neurologic: Grossly normal  Pelvic: External genitalia:  no lesions              Urethra:  normal appearing urethra with no masses, tenderness or lesions              Bartholins and Skenes: normal                 Vagina: normal appearing vagina with normal  color and discharge, no lesions              Cervix: no lesions  Bimanual Exam:  Uterus:  normal size, contour, position, consistency, mobility, non-tender              Adnexa: no mass, fullness, tenderness              Rectal exam: {yes no:[id number]}.  Confirms.              Anus:  normal sphincter tone, no lesions  Chaperone was present for exam.  ASSESSMENT     PLAN     An After Visit Summary was printed and given to the patient.  ______ minutes face to face time of which over 50% was spent in counseling.

## 2019-03-26 NOTE — Progress Notes (Signed)
GYNECOLOGY  VISIT   HPI: 49 y.o.   Single  Caucasian  female   G0P0000 with Patient's last menstrual period was 03/17/2019 (approximate).   here to discuss PUS results and pap and possible EMB.  Menses are irregular.  Never the same time every month.  Menses occur every 21 - 31 days.  She spots in between her menses, but not always.  This occurs about 2 times per year. Has heavy cycles lasting 3 - 4 days.  She changes pad every 30 - 60 minutes at times.   Normal TFTs and CBC 01/08/19.  She has some right sided pelvic discomfort for some months.  Occurs off and on and is not too bad.   Some lower back pain for years.  Has back pain during her menses.   CLINICAL DATA:  Menorrhagia with irregular cycle for several years  EXAM: TRANSABDOMINAL AND TRANSVAGINAL ULTRASOUND OF PELVIS  TECHNIQUE: Both transabdominal and transvaginal ultrasound examinations of the pelvis were performed. Transabdominal technique was performed for global imaging of the pelvis including uterus, ovaries, adnexal regions, and pelvic cul-de-sac. It was necessary to proceed with endovaginal exam following the transabdominal exam to visualize the endometrium, and to characterize a RIGHT adnexal cystic lesion.  COMPARISON:  None  FINDINGS: Uterus  Measurements: 9.6 x 4.1 x 5.3 cm = volume: 108 mL. Anteverted. Tiny anterior wall submucosal leiomyoma at lower uterine segment 9 x 7 x 9 mm. No additional masses.  Endometrium  Thickness: 11 mm.  No endometrial fluid or focal abnormality  Right ovary  Measurements: 3.0 x 1.7 x 2.9 cm = volume: 7.7 mL. Normal morphology without mass, see below  Left ovary  Measurements: 2.0 x 1.4 x 2.0 cm = volume: 2.9 mL. Normal morphology without mass  Other findings  Large cystic lesion identified in RIGHT adnexa 5.7 x 5.0 x 5.5 cm in size, abutting the RIGHT ovary. This could represent a RIGHT ovarian or paraovarian cyst. Lesion demonstrates a  smooth wall without definite mural nodularity. Internal echogenicity at portion closest to the transducer is seen likely near field artifact. No definite septations or internal complexity identified.  IMPRESSION: Tiny submucosal leiomyoma of the anterior lower uterus 9 mm diameter.  Otherwise unremarkable uterus, endometrial complex and LEFT ovary.  5.7 cm diameter generally simple appearing exophytic RIGHT ovarian versus paraovarian cyst.  Follow-up transvaginal ultrasound recommended in 6 months to demonstrate stability.   Electronically Signed   By: Ulyses Southward M.D.   On: 03/22/2019 16:57  Patient is also looking for an alternative to colonoscopy.  I sent her to GI due to a rectal nodule noted on physical exam.   GYNECOLOGIC HISTORY: Patient's last menstrual period was 03/17/2019 (approximate). Contraception: None/same sex partner Menopausal hormone therapy:  none Last mammogram: 02-07-19 Diag.Bil. w/Rt.Br.US/Simple cyst Rt.Br.10 o'clock,Lt.Br.Neg/density B/BiRads2/screening 24yr Last pap smear: 07-31-10 Neg                OB History    Gravida  0   Para  0   Term  0   Preterm  0   AB  0   Living  0     SAB  0   TAB  0   Ectopic  0   Multiple  0   Live Births  0              Patient Active Problem List   Diagnosis Date Noted  . Elevated LDL cholesterol level   . Obesity (BMI 30-39.9) 01/08/2019  .  Prediabetes 01/08/2019  . Nail deformity 01/08/2019  . Vitamin D deficiency 01/08/2019  . Exercise-induced asthma   . Rheumatoid arthritis ([place])   . Kidney stones   . Depression     Past Medical History:  Diagnosis Date  . Depression   . Elevated LDL cholesterol level   . Exercise-induced asthma   . Kidney stones   . Prediabetes   . Rheumatoid arthritis ([place])   . Urinary incontinence   . Vitamin D deficiency     Past Surgical History:  Procedure Laterality Date  . GALLBLADDER SURGERY    . KIDNEY STONE SURGERY    . PALATE /  UVULA BIOPSY / EXCISION    . shoulder surgery     . TONSILLECTOMY      Current Outpatient Medications  Medication Sig Dispense Refill  . albuterol (VENTOLIN HFA) 108 (90 Base) MCG/ACT inhaler Inhale 2 puffs into the lungs 4 (four) times daily as needed.    Marland Kitchen buPROPion (WELLBUTRIN XL) 150 MG 24 hr tablet Take one tablet every morning x 7 days, then take two tablets every morning. (Patient taking differently: Take 300 mg by mouth daily. Take one tablet every morning x 7 days, then take two tablets every morning.) 60 tablet 2  . DULoxetine (CYMBALTA) 60 MG capsule Take 60 mg by mouth daily.    Marland Kitchen etanercept (ENBREL) 50 MG/ML injection Inject 50 mg into the skin once a week.    . fexofenadine (ALLEGRA) 180 MG tablet Take 180 mg by mouth daily.    . folic acid (FOLVITE) 1 MG tablet Take 1 mg by mouth daily.    . methotrexate 50 MG/2ML injection Inject 50 mg into the muscle once a week.    . Vitamin D, Ergocalciferol, (DRISDOL) 1.25 MG (50000 UT) CAPS capsule Take 1 capsule (50,000 Units total) by mouth every 7 (seven) days. 8 capsule 0   No current facility-administered medications for this visit.     ALLERGIES: Patient has no known allergies.  Family History  Problem Relation Age of Onset  . Diabetes Mother   . Hypothyroidism Mother   . Heart Problems Mother        pacemaker   . Cancer Mother        endometrial ca?  . Thyroid disease Mother   . Hypertension Father   . Diabetes Father   . Non-Hodgkin's lymphoma Paternal Grandmother     Social History   Socioeconomic History  . Marital status: Single    Spouse name: Not on file  . Number of children: Not on file  . Years of education: Not on file  . Highest education level: Not on file  Occupational History  . Not on file  Tobacco Use  . Smoking status: Former Smoker    Packs/day: 1.00    Years: 16.00    Pack years: 16.00    Quit date: 2002    Years since quitting: 19.2  . Smokeless tobacco: Never Used  Substance and  Sexual Activity  . Alcohol use: Yes    Alcohol/week: 1.0 standard drinks    Types: 1 Glasses of wine per week    Comment: once a week  . Drug use: Never  . Sexual activity: Not Currently    Birth control/protection: Abstinence  Other Topics Concern  . Not on file  Social History Narrative  . Not on file   Social Determinants of Health   Financial Resource Strain:   . Difficulty of Paying Living Expenses: Not on  file  Food Insecurity:   . Worried About Programme researcher, broadcasting/film/video in the Last Year: Not on file  . Ran Out of Food in the Last Year: Not on file  Transportation Needs:   . Lack of Transportation (Medical): Not on file  . Lack of Transportation (Non-Medical): Not on file  Physical Activity:   . Days of Exercise per Week: Not on file  . Minutes of Exercise per Session: Not on file  Stress:   . Feeling of Stress : Not on file  Social Connections:   . Frequency of Communication with Friends and Family: Not on file  . Frequency of Social Gatherings with Friends and Family: Not on file  . Attends Religious Services: Not on file  . Active Member of Clubs or Organizations: Not on file  . Attends Banker Meetings: Not on file  . Marital Status: Not on file  Intimate Partner Violence:   . Fear of Current or Ex-Partner: Not on file  . Emotionally Abused: Not on file  . Physically Abused: Not on file  . Sexually Abused: Not on file    Review of Systems  All other systems reviewed and are negative.   PHYSICAL EXAMINATION:    BP 118/76 (Cuff Size: Large)   Pulse 76   Temp (!) 96.9 F (36.1 C) (Temporal)   Ht 5\' 2"  (1.575 m)   Wt 223 lb 3.2 oz (101.2 kg)   LMP 03/17/2019 (Approximate)   BMI 40.82 kg/m     General appearance: alert, cooperative and appears stated age    Pelvic: External genitalia:  no lesions              Urethra:  normal appearing urethra with no masses, tenderness or lesions              Bartholins and Skenes: normal                  Vagina: normal appearing vagina with normal color and discharge, no lesions              Cervix: no lesions.  Pap taken.                 Bimanual Exam:  Uterus:  normal size, contour, position, consistency, mobility, non-tender              Adnexa: no mass, fullness, tenderness              EMB Consent for procedure. Sterile prep with Hibiclens.  Paracervical block 10 cc 1% lidocaine, Lot [id number], exp 4/22.  Pipelle to 7 cm x 2. Tissue to pathology.  No complications.  Minimal EBL.   Chaperone was present for exam.  ASSESSMENT  Menorrhagia with irregular menses.  Need cervical cancer screening.  Pelvic and back pain.  Right ovarian cyst. Rectal nodule.   PLAN  Pap and HR HPV testing today.  CA125.  FU EMB.  Discussion of 5/22 findings - ovarian/para-ovarian cyst.  Likely benign.  FU [place] in 6 weeks.  Torsion risk discussed. She will contact GI back and discuss potential anoscopy as a starting point for her evaluation.  She knows she needs colonoscopy for colon cancer screening at age 67.    An After Visit Summary was printed and given to the patient.  __15____ minutes face to face time of which over 50% was spent in counseling.

## 2019-03-28 ENCOUNTER — Other Ambulatory Visit (HOSPITAL_COMMUNITY)
Admission: RE | Admit: 2019-03-28 | Discharge: 2019-03-28 | Disposition: A | Discharge status: Home / Self Care | Payer: BC Managed Care – PPO | Source: Ambulatory Visit | Attending: Obstetrics and Gynecology | Admitting: Obstetrics and Gynecology

## 2019-03-28 ENCOUNTER — Ambulatory Visit: Payer: BC Managed Care – PPO | Admitting: Adult Health

## 2019-03-28 ENCOUNTER — Other Ambulatory Visit: Payer: Self-pay

## 2019-03-28 ENCOUNTER — Ambulatory Visit (INDEPENDENT_AMBULATORY_CARE_PROVIDER_SITE_OTHER): Payer: BC Managed Care – PPO | Admitting: Obstetrics and Gynecology

## 2019-03-28 ENCOUNTER — Encounter: Payer: Self-pay | Admitting: Obstetrics and Gynecology

## 2019-03-28 VITALS — BP 118/76 | HR 76 | Temp 96.9°F | Ht 62.0 in | Wt 223.2 lb

## 2019-03-28 DIAGNOSIS — N840 Polyp of corpus uteri: Secondary | ICD-10-CM | POA: Diagnosis not present

## 2019-03-28 DIAGNOSIS — N921 Excessive and frequent menstruation with irregular cycle: Secondary | ICD-10-CM | POA: Insufficient documentation

## 2019-03-28 DIAGNOSIS — Z124 Encounter for screening for malignant neoplasm of cervix: Secondary | ICD-10-CM | POA: Insufficient documentation

## 2019-03-28 DIAGNOSIS — N920 Excessive and frequent menstruation with regular cycle: Secondary | ICD-10-CM | POA: Diagnosis not present

## 2019-03-28 DIAGNOSIS — N83201 Unspecified ovarian cyst, right side: Secondary | ICD-10-CM | POA: Insufficient documentation

## 2019-03-28 DIAGNOSIS — Z1151 Encounter for screening for human papillomavirus (HPV): Secondary | ICD-10-CM | POA: Insufficient documentation

## 2019-03-28 NOTE — Patient Instructions (Signed)

## 2019-03-29 LAB — CA 125: Cancer Antigen (CA) 125: 12.5 U/mL (ref 0.0–38.1)

## 2019-03-29 LAB — CYTOLOGY - PAP
Comment: NEGATIVE
Diagnosis: NEGATIVE
High risk HPV: NEGATIVE

## 2019-03-30 LAB — SURGICAL PATHOLOGY

## 2019-04-02 ENCOUNTER — Other Ambulatory Visit: Payer: Self-pay

## 2019-04-02 ENCOUNTER — Encounter: Payer: Self-pay | Admitting: Adult Health

## 2019-04-02 ENCOUNTER — Ambulatory Visit (INDEPENDENT_AMBULATORY_CARE_PROVIDER_SITE_OTHER): Payer: BC Managed Care – PPO | Admitting: Adult Health

## 2019-04-02 DIAGNOSIS — F411 Generalized anxiety disorder: Secondary | ICD-10-CM | POA: Diagnosis not present

## 2019-04-02 DIAGNOSIS — F331 Major depressive disorder, recurrent, moderate: Secondary | ICD-10-CM

## 2019-04-02 DIAGNOSIS — G4700 Insomnia, unspecified: Secondary | ICD-10-CM | POA: Diagnosis not present

## 2019-04-02 DIAGNOSIS — R4189 Other symptoms and signs involving cognitive functions and awareness: Secondary | ICD-10-CM

## 2019-04-02 DIAGNOSIS — R4689 Other symptoms and signs involving appearance and behavior: Secondary | ICD-10-CM

## 2019-04-02 MED ORDER — DULOXETINE HCL 30 MG PO CPEP: ORAL_CAPSULE | ORAL | 2 refills | Status: DC

## 2019-04-02 NOTE — Progress Notes (Signed)
Olivia Sims [id number] Aug 07, 1970 49 y.o.  Subjective:   Patient ID:  Olivia Sims is a 49 y.o. (DOB September 02, 1970) female.  Chief Complaint: No chief complaint on file.   HPI Olivia Sims presents to the office today for follow-up of MDD, GAD, insomnia, cognitive and behavioral issues, and fatigue.  Describes mood today as "ok". Pleasant. Tearful. Mood symptoms - reports depression, anxiety, and irritability. Continues to feel more "depressed" overall. Stopped Abilify due to "restlessness". Has restarted the Wellbutrin XL 150 mg every morning. Feel depressed "3 out of 7 days".Trying to get out and do more. Does not feel like she is ready to return to work at this time. Working with therapist - sees weekly. Varying interest and motivation. Taking medications as prescribed.  Energy levels low - "has spurts". Can do one project, then exhausted the rest of the day. Active, does not have a regular exercise routine. 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 Is out of work currently.   Enjoys some usual interests and activities. Single. Lives with sister. Mother and father local.  Appetite adequate. Weight loss - 3 pounds - 8 total. Sleeps better some nights than others. Averages 4 to 5 hours or 10 to 11 hours. Focus and concentration "difficulties at times". Having to write things down to "keep up with them". Completing tasks. Managing some aspects of household. Reading.  Denies SI or HI. Denies AH or VH.  Past medications: Wellbutrin  PHQ2-9     Office Visit from 01/08/2019 in [facility] Office Visit from 12/28/2018 in [facility]  PHQ-2 Total Score  2  2  PHQ-9 Total Score  13  10       Review of Systems:  Review of Systems  Musculoskeletal: Negative for gait problem.  Neurological: Negative for tremors.  Psychiatric/Behavioral:       Please refer to HPI    Medications: I have reviewed the patient's current  medications.  Current Outpatient Medications  Medication Sig Dispense Refill  . albuterol (VENTOLIN HFA) 108 (90 Base) MCG/ACT inhaler Inhale 2 puffs into the lungs 4 (four) times daily as needed.    Marland Kitchen buPROPion (WELLBUTRIN XL) 150 MG 24 hr tablet Take one tablet every morning x 7 days, then take two tablets every morning. (Patient taking differently: Take 300 mg by mouth daily. Take one tablet every morning x 7 days, then take two tablets every morning.) 60 tablet 2  . DULoxetine (CYMBALTA) 60 MG capsule Take 60 mg by mouth daily.    Marland Kitchen etanercept (ENBREL) 50 MG/ML injection Inject 50 mg into the skin once a week.    . fexofenadine (ALLEGRA) 180 MG tablet Take 180 mg by mouth daily.    . folic acid (FOLVITE) 1 MG tablet Take 1 mg by mouth daily.    . methotrexate 50 MG/2ML injection Inject 50 mg into the muscle once a week.    . Vitamin D, Ergocalciferol, (DRISDOL) 1.25 MG (50000 UT) CAPS capsule Take 1 capsule (50,000 Units total) by mouth every 7 (seven) days. 8 capsule 0   No current facility-administered medications for this visit.    Medication Side Effects: None  Allergies: No Known Allergies  Past Medical History:  Diagnosis Date  . Depression   . Elevated LDL cholesterol level   . Exercise-induced asthma   . Kidney stones   . Prediabetes   . Rheumatoid arthritis ([facility])   . Urinary incontinence   . Vitamin D deficiency  Family History  Problem Relation Age of Onset  . Diabetes Mother   . Hypothyroidism Mother   . Heart Problems Mother        pacemaker   . Cancer Mother        endometrial ca?  . Thyroid disease Mother   . Hypertension Father   . Diabetes Father   . Non-Hodgkin's lymphoma Paternal Grandmother     Social History   Socioeconomic History  . Marital status: Single    Spouse name: Not on file  . Number of children: Not on file  . Years of education: Not on file  . Highest education level: Not on file  Occupational History  . Not on file   Tobacco Use  . Smoking status: Former Smoker    Packs/day: 1.00    Years: 16.00    Pack years: 16.00    Quit date: 2002    Years since quitting: 19.2  . Smokeless tobacco: Never Used  Substance and Sexual Activity  . Alcohol use: Yes    Alcohol/week: 1.0 standard drinks    Types: 1 Glasses of wine per week    Comment: once a week  . Drug use: Never  . Sexual activity: Not Currently    Birth control/protection: Abstinence  Other Topics Concern  . Not on file  Social History Narrative  . Not on file   Social Determinants of Health   Financial Resource Strain:   . Difficulty of Paying Living Expenses:   Food Insecurity:   . Worried About Charity fundraiser in the Last Year:   . Arboriculturist in the Last Year:   Transportation Needs:   . Film/video editor (Medical):   Marland Kitchen Lack of Transportation (Non-Medical):   Physical Activity:   . Days of Exercise per Week:   . Minutes of Exercise per Session:   Stress:   . Feeling of Stress :   Social Connections:   . Frequency of Communication with Friends and Family:   . Frequency of Social Gatherings with Friends and Family:   . Attends Religious Services:   . Active Member of Clubs or Organizations:   . Attends Archivist Meetings:   Marland Kitchen Marital Status:   Intimate Partner Violence:   . Fear of Current or Ex-Partner:   . Emotionally Abused:   Marland Kitchen Physically Abused:   . Sexually Abused:     Past Medical History, Surgical history, Social history, and Family history were reviewed and updated as appropriate.   Please see review of systems for further details on the patient's review from today.   Objective:   Physical Exam:  LMP 03/17/2019 (Approximate)   Physical Exam Constitutional:      General: She is not in acute distress. Musculoskeletal:        General: No deformity.  Neurological:     Mental Status: She is alert and oriented to person, place, and time.     Coordination: Coordination normal.   Psychiatric:        Attention and Perception: Attention and perception normal. She does not perceive auditory or visual hallucinations.        Mood and Affect: Mood is depressed. Mood is not anxious. Affect is not labile, blunt, angry or inappropriate.        Speech: Speech normal.        Behavior: Behavior normal.        Thought Content: Thought content normal. Thought content is not paranoid or  delusional. Thought content does not include homicidal or suicidal ideation. Thought content does not include homicidal or suicidal plan.        Cognition and Memory: Cognition and memory normal.        Judgment: Judgment normal.     Comments: Insight intact     Lab Review:     Component Value Date/Time   NA 136 01/08/2019 0914   K 4.7 01/08/2019 0914   CL 101 01/08/2019 0914   CO2 21 01/08/2019 0914   GLUCOSE 115 (H) 01/08/2019 0914   GLUCOSE 110 (H) 09/25/2008 1101   BUN 13 01/08/2019 0914   CREATININE 0.96 01/08/2019 0914   CALCIUM 9.0 01/08/2019 0914   PROT 7.0 01/08/2019 0914   ALBUMIN 4.2 01/08/2019 0914   AST 21 01/08/2019 0914   ALT 20 01/08/2019 0914   ALKPHOS 83 01/08/2019 0914   BILITOT 0.5 01/08/2019 0914   GFRNONAA 70 01/08/2019 0914   GFRAA 80 01/08/2019 0914       Component Value Date/Time   WBC 10.5 01/08/2019 0914   WBC 11.7 (H) 09/25/2008 1101   RBC 4.71 01/08/2019 0914   RBC 4.64 09/25/2008 1101   HGB 13.8 01/08/2019 0914   HCT 40.6 01/08/2019 0914   PLT 316 01/08/2019 0914   MCV 86 01/08/2019 0914   MCH 29.3 01/08/2019 0914   MCHC 34.0 01/08/2019 0914   MCHC 33.7 09/25/2008 1101   RDW 13.0 01/08/2019 0914   LYMPHSABS 1.9 01/08/2019 0914   EOSABS 0.2 01/08/2019 0914   BASOSABS 0.1 01/08/2019 0914    No results found for: POCLITH, LITHIUM   No results found for: PHENYTOIN, PHENOBARB, VALPROATE, CBMZ   .res Assessment: Plan:    Plan:  Continue Wellbutrin XL 150mg  daily  Increase Cymbalta 60mg  to 90mg  capsule  Working with individual  therapist.  Patient to remain out of work at this time. Out of work 03/29/2019 through 04/26/2018. Will assess a RTW date at next visit.   RTC 4 weeks  Patient advised to contact office with any questions, adverse effects, or acute worsening in signs and symptoms.  Discussed potential metabolic side effects associated with atypical antipsychotics, as well as potential risk for movement side effects. Advised pt to contact office if movement side effects occur.   There are no diagnoses linked to this encounter.   Please see After Visit Summary for patient specific instructions.  Future Appointments  Date Time Provider Department Center  01/09/2020  9:30 AM 05/29/2019, NP-C [facility]    No orders of the defined types were placed in this encounter.   -------------------------------

## 2019-04-10 ENCOUNTER — Telehealth: Payer: Self-pay | Admitting: Obstetrics and Gynecology

## 2019-04-10 NOTE — Telephone Encounter (Signed)
Left message to return call to Hayley at [phone] 

## 2019-04-19 ENCOUNTER — Telehealth: Payer: Self-pay

## 2019-04-19 NOTE — Progress Notes (Signed)
GYNECOLOGY  VISIT   HPI: 49 y.o.   Single  Caucasian  female   G0P0000 with Patient's last menstrual period was 04/19/2019 (exact date).   here for consult.    Patient has an endometrial polyp on EMB done for menorrhagia with irregular menses.  On pelvic US, she has an ovarian cyst 5.7 cm in larges diameter abutting the right ovary.  She also has a tiny submucosal fibroid measuring 9 mm in the LUS. Her EMS measured 11 mm.   CA125 12.5.   States her bleeding is really heavy, and that she is staining through clothing.  She has a sharp pain on her side three to four times a day, and then it goes away.   Started a new job and will have new insurance, beginning in June, 2021. She works at [employer].  Declines future childbearing.   GYNECOLOGIC HISTORY: Patient's last menstrual period was 04/19/2019 (exact date). Contraception:  None/same sex partner Menopausal hormone therapy:  none Last mammogram: 02-07-19 Diag.Bil. w/Rt.Br.US/Simple cyst Rt.Br.10 o'clock,Lt.Br.Neg/density B/BiRads2/screening 68yr Last pap smear:  07-31-10 Neg, 03/28/19 - WNL, neg HR HPV        OB History    Gravida  0   Para  0   Term  0   Preterm  0   AB  0   Living  0     SAB  0   TAB  0   Ectopic  0   Multiple  0   Live Births  0              Patient Active Problem List   Diagnosis Date Noted  . Elevated LDL cholesterol level   . Obesity (BMI 30-39.9) 01/08/2019  . Prediabetes 01/08/2019  . Nail deformity 01/08/2019  . Vitamin D deficiency 01/08/2019  . Exercise-induced asthma   . Rheumatoid arthritis ([facility])   . Kidney stones   . Depression     Past Medical History:  Diagnosis Date  . Depression   . Elevated LDL cholesterol level   . Endometrial polyp 2021  . Exercise-induced asthma   . Kidney stones   . Prediabetes   . Rheumatoid arthritis ([facility])   . Right ovarian cyst 2021  . Urinary incontinence   . Vitamin D deficiency     Past Surgical History:  Procedure  Laterality Date  . GALLBLADDER SURGERY    . KIDNEY STONE SURGERY    . PALATE / UVULA BIOPSY / EXCISION    . shoulder surgery     . TONSILLECTOMY      Current Outpatient Medications  Medication Sig Dispense Refill  . albuterol (VENTOLIN HFA) 108 (90 Base) MCG/ACT inhaler Inhale 2 puffs into the lungs 4 (four) times daily as needed.    Marland Kitchen buPROPion (WELLBUTRIN XL) 150 MG 24 hr tablet Take one tablet every morning x 7 days, then take two tablets every morning. (Patient taking differently: Take 300 mg by mouth daily. Take one tablet every morning x 7 days, then take two tablets every morning.) 60 tablet 2  . DULoxetine (CYMBALTA) 30 MG capsule Take three capsules daily. 90 capsule 2  . etanercept (ENBREL) 50 MG/ML injection Inject 50 mg into the skin once a week.    . fexofenadine (ALLEGRA) 180 MG tablet Take 180 mg by mouth daily.    . folic acid (FOLVITE) 1 MG tablet Take 1 mg by mouth daily.    . methotrexate 50 MG/2ML injection Inject 50 mg into the muscle once  a week.    . Vitamin D, Ergocalciferol, (DRISDOL) 1.25 MG (50000 UT) CAPS capsule Take 1 capsule (50,000 Units total) by mouth every 7 (seven) days. 8 capsule 0   No current facility-administered medications for this visit.     ALLERGIES: Patient has no known allergies.  Family History  Problem Relation Age of Onset  . Diabetes Mother   . Hypothyroidism Mother   . Heart Problems Mother        pacemaker   . Cancer Mother        endometrial ca?  . Thyroid disease Mother   . Hypertension Father   . Diabetes Father   . Non-Hodgkin's lymphoma Paternal Grandmother     Social History   Socioeconomic History  . Marital status: Single    Spouse name: Not on file  . Number of children: Not on file  . Years of education: Not on file  . Highest education level: Not on file  Occupational History  . Not on file  Tobacco Use  . Smoking status: Former Smoker    Packs/day: 1.00    Years: 16.00    Pack years: 16.00    Quit  date: 2002    Years since quitting: 19.2  . Smokeless tobacco: Never Used  Substance and Sexual Activity  . Alcohol use: Yes    Alcohol/week: 1.0 standard drinks    Types: 1 Glasses of wine per week    Comment: once a week  . Drug use: Never  . Sexual activity: Not Currently    Birth control/protection: Abstinence  Other Topics Concern  . Not on file  Social History Narrative  . Not on file   Social Determinants of Health   Financial Resource Strain:   . Difficulty of Paying Living Expenses:   Food Insecurity:   . Worried About Programme researcher, broadcasting/film/video in the Last Year:   . Barista in the Last Year:   Transportation Needs:   . Freight forwarder (Medical):   Marland Kitchen Lack of Transportation (Non-Medical):   Physical Activity:   . Days of Exercise per Week:   . Minutes of Exercise per Session:   Stress:   . Feeling of Stress :   Social Connections:   . Frequency of Communication with Friends and Family:   . Frequency of Social Gatherings with Friends and Family:   . Attends Religious Services:   . Active Member of Clubs or Organizations:   . Attends Banker Meetings:   Marland Kitchen Marital Status:   Intimate Partner Violence:   . Fear of Current or Ex-Partner:   . Emotionally Abused:   Marland Kitchen Physically Abused:   . Sexually Abused:     Review of Systems  All other systems reviewed and are negative.   PHYSICAL EXAMINATION:    BP 118/82 (Cuff Size: Large)   Pulse 70   Temp 97.7 F (36.5 C) (Temporal)   Ht 5\' 2"  (1.575 m)   Wt 225 lb (102.1 kg)   LMP 04/19/2019 (Exact Date)   BMI 41.15 kg/m     General appearance: alert, cooperative and appears stated age  ASSESSMENT  Menorrhagia with irregular menses.  Endometrial polyp. Small fibroid.  Right adnexal cyst. FH of uterine cancer in mother.   PLAN  We reviewed her options for care - sonohysterogram to confirm if polyp is micro or macroscopic and recheck the adnexal cyst, hysteroscopy with Myosure removal  of polyp, dilation and curettage, and laparoscopic removal of  adnexal cyst and possible oophorectomy versus total laparoscopic hysterectomy with bilateral salpingectomy, right adnexal cyst removal, possible bilateral oophorectomy, cystoscopy.   She chooses definitive treatment of her bleeding and the hysterectomy option.  ACOG HO patient.    An After Visit Summary was printed and given to the patient.  __25____ minutes face to face time of which over 50% was spent in counseling.

## 2019-04-19 NOTE — Telephone Encounter (Signed)
Spoke with patient, advised of all results as seen below per Dr. Edward Jolly.  OV scheduled for 04/24/19 at 8:30am to further discuss tx options. Patient verbalizes understanding and is agreeable.   02 recall placed.   Routing to provider for final review. Patient is agreeable to disposition. Will close encounter.

## 2019-04-19 NOTE — Telephone Encounter (Signed)
Olivia Salles, MD  04/19/2019 9:07 AM EDT    Please contact patient's sister, who is on her DPI.  We need to get in touch with the patient to communicate results and make a plan.   Please open up a phone note also.   [name], RN  04/10/2019 2:11 PM EDT    Left message for pt to call back for results.    [name], RN  04/04/2019 9:35 AM EDT    Left message for pt to call back to triage RN for results.    Olivia Salles, MD  04/02/2019 5:08 PM EDT    Correction to previous result note.  Her ovarian cyst is in the right.    Olivia Salles, MD  04/02/2019 5:05 PM EDT    Please contact patient in follow up to lab results.   Her pap is normal and HR HPV is negative.  Annual exam recall - 02.  Her endometrial biopsy showed a benign endometrial polyp.  This will need to be addressed in some fashion.  Please offer an office visit to discuss further.  She has options including sonohysterogram or going directly to surgical care.   She is having a recheck of her left ovarian cyst in about 5 weeks.    Spoke with patient's sister Olivia Sims, okay per ROI. Olivia Sims will contact her sister and have her return call to the office.

## 2019-04-23 ENCOUNTER — Other Ambulatory Visit: Payer: Self-pay

## 2019-04-24 ENCOUNTER — Ambulatory Visit (INDEPENDENT_AMBULATORY_CARE_PROVIDER_SITE_OTHER): Payer: BC Managed Care – PPO | Admitting: Obstetrics and Gynecology

## 2019-04-24 ENCOUNTER — Encounter: Payer: Self-pay | Admitting: Obstetrics and Gynecology

## 2019-04-24 ENCOUNTER — Telehealth: Payer: Self-pay | Admitting: Obstetrics and Gynecology

## 2019-04-24 VITALS — BP 118/82 | HR 70 | Temp 97.7°F | Ht 62.0 in | Wt 225.0 lb

## 2019-04-24 DIAGNOSIS — N921 Excessive and frequent menstruation with irregular cycle: Secondary | ICD-10-CM | POA: Diagnosis not present

## 2019-04-24 DIAGNOSIS — N949 Unspecified condition associated with female genital organs and menstrual cycle: Secondary | ICD-10-CM

## 2019-04-24 DIAGNOSIS — N840 Polyp of corpus uteri: Secondary | ICD-10-CM

## 2019-04-24 NOTE — Telephone Encounter (Signed)
Routing to Hayley Carder for precert. 

## 2019-04-24 NOTE — Telephone Encounter (Signed)
Patient will be working toward a total laparoscopic hysterectomy with bilateral salpingectomy, right adnexal cyst removal, possible bilateral oophorectomy, and cystoscopy.   She has menorrhagia with irregular menses, endometrial polyp, left adnexal cyst.   She is going through a job and insurance change, and she will need to direct the timing of the surgery and which insurances she has.

## 2019-04-25 NOTE — Telephone Encounter (Signed)
Spoke with patient regarding surgery benefits. Patient acknowledges understanding of information presented. Patient is aware that benefits presented are for professional benefits only. Patient is aware that once surgery is scheduled, the hospital will call with separate benefits. Patient is aware of surgery cancellation policy.  Patient would like to have surgery scheduled before the end of the month. Patient to contact employer regarding requesting time off. Patient would like to possibly proceed with 05/14/2019 or 05/15/2019 pending OR availability and getting time off.  Routing to [place] to contact patient regarding scheduling surgery.

## 2019-04-25 NOTE — Telephone Encounter (Signed)
Patient returning call to Hayley  

## 2019-04-25 NOTE — Telephone Encounter (Signed)
Left message for patient to return call to [facility] to go over benefits for recommended surgery with Dr. Edward Jolly.

## 2019-04-25 NOTE — Telephone Encounter (Addendum)
Spoke with patient. Patient would like to proceed with surgery on 05/14/2019 at 0730 am. Surgery scheduled at [facility] for 05/14/2019 at 10 am. Pre op scheduled for 05/07/2019 at 4 pm with Dr.Silva. COVID test scheduled for 05/10/2019 at 3 pm. Patient is aware she will need to quarantine after test until surgery. 1 week post op scheduled for 05/21/2019 at 1 pm with Dr.Silva. 6 week post op scheduled for 06/25/2019 at 2 pm with Dr.Silva. Patient is agreeable to all dates and times. Surgery instructions reviewed and mailed to patient's verified home address on file.  Routing to provider and will close encounter.

## 2019-04-30 ENCOUNTER — Ambulatory Visit: Payer: BC Managed Care – PPO | Admitting: Adult Health

## 2019-05-04 ENCOUNTER — Telehealth: Payer: Self-pay | Admitting: Obstetrics and Gynecology

## 2019-05-04 NOTE — Telephone Encounter (Signed)
Please contact Dr. Leonette Monarch office regarding patient's Enbrel and Methotrexate.   I believe these will need to be discontinued prior to surgery and then restart at a specific time after surgery is completed.   This will need to be communicated to me and the patient.

## 2019-05-04 NOTE — Telephone Encounter (Signed)
Call to Dr Gwynneth Munson. Left message with Inetta Fermo regarding surgery date, case and need instructions for stop and restart of Enbrel and Methotrexate.

## 2019-05-07 ENCOUNTER — Ambulatory Visit: Payer: Self-pay | Admitting: Obstetrics and Gynecology

## 2019-05-07 NOTE — Telephone Encounter (Signed)
Call from [place] at [facility]. Patient should stop both medications now and resume 2 weeks post op when sutures are out and no complications.

## 2019-05-07 NOTE — Telephone Encounter (Signed)
Call to patient to advise of medication instructions. Left message to call back to [place] or [place].

## 2019-05-07 NOTE — Telephone Encounter (Signed)
2nd call to patient. Per DPR, can leave message on voice mail.  Left message calling to follow-up on medications for surgery. Please call back in am for instructions. Will also send My Chart message.

## 2019-05-07 NOTE — Progress Notes (Signed)
Olivia Sims  05/07/2019      Your procedure is scheduled on4/26/21   Report to [place]  at    [place].M.  Call this number if you have problems the morning of surgery:[phone]  OUR ADDRESS IS [address], WE ARE LOCATED [facility] WITH [facility].   Remember:  Do not eat food  after midnight.  Take these medicines the morning of surgery with A SIP OF WATER Inhalers as usual and bring, Wellbutrin, Cymbalta, Allegra   Do not wear jewelry, make-up or nail polish.  Do not wear lotions, powders, or perfumes, or deoderant.  Do not shave 48 hours prior to surgery.  Men may shave face and neck.  Do not bring valuables to the hospital.  [facility] is not responsible for any belongings or valuables.  Contacts, dentures or bridgework may not be worn into surgery.  Leave your suitcase in the car.  After surgery it may be brought to your room.  For patients admitted to the hospital, discharge time will be determined by your treatment team.  Patients discharged the day of surgery will not be allowed to drive home.     Please read over the following fact sheets that you were given:       NO SOLID FOOD AFTER MIDNIGHT THE NIGHT PRIOR TO SURGERY. NOTHING BY MOUTH EXCEPT CLEAR LIQUIDS UNTIL 0430am. PLEASE FINISH ENSURE DRINK PER SURGEON ORDER  WHICH NEEDS TO BE COMPLETED AT .0430am   CLEAR LIQUID DIET   Foods Allowed                                                                     Foods Excluded  Coffee and tea, regular and decaf                             liquids that you cannot  Plain Jell-O any favor except red or purple                                           see through such as: Fruit ices (not with fruit pulp)                                     milk, soups, orange juice  Iced Popsicles                                    All solid food Carbonated beverages, regular and diet                                    Cranberry, grape  and apple juices Sports drinks like Gatorade Lightly seasoned clear broth or consume(fat free) Sugar, honey syrup  Sample Menu Breakfast  Lunch                                     Supper Cranberry juice                    Beef broth                            Chicken broth Jell-O                                     Grape juice                           Apple juice Coffee or tea                        Jell-O                                      Popsicle                                                Coffee or tea                        Coffee or tea  _____________________________________________________________________  [facility] - Preparing for Surgery Before surgery, you can play an important role.  Because skin is not sterile, your skin needs to be as free of germs as possible.  You can reduce the number of germs on your skin by washing with CHG (chlorahexidine gluconate) soap before surgery.  CHG is an antiseptic cleaner which kills germs and bonds with the skin to continue killing germs even after washing. Please DO NOT use if you have an allergy to CHG or antibacterial soaps.  If your skin becomes reddened/irritated stop using the CHG and inform your nurse when you arrive at Short Stay. Do not shave (including legs and underarms) for at least 48 hours prior to the first CHG shower.  You may shave your face/neck. Please follow these instructions carefully:  1.  Shower with CHG Soap the night before surgery and the  morning of Surgery.  2.  If you choose to wash your hair, wash your hair first as usual with your  normal  shampoo.  3.  After you shampoo, rinse your hair and body thoroughly to remove the  shampoo.                           4.  Use CHG as you would any other liquid soap.  You can apply chg directly  to the skin and wash                       Gently with a scrungie or clean washcloth.  5.  Apply the CHG Soap to your body ONLY FROM THE NECK DOWN.    Do not use on face/ open  Wound or open sores. Avoid contact with eyes, ears mouth and genitals (private parts).                       Wash face,  Genitals (private parts) with your normal soap.             6.  Wash thoroughly, paying special attention to the area where your surgery  will be performed.  7.  Thoroughly rinse your body with warm water from the neck down.  8.  DO NOT shower/wash with your normal soap after using and rinsing off  the CHG Soap.                9.  Pat yourself dry with a clean towel.            10.  Wear clean pajamas.            11.  Place clean sheets on your bed the night of your first shower and do not  sleep with pets. Day of Surgery : Do not apply any lotions/deodorants the morning of surgery.  Please wear clean clothes to the hospital/surgery center.  FAILURE TO FOLLOW THESE INSTRUCTIONS MAY RESULT IN THE CANCELLATION OF YOUR SURGERY PATIENT SIGNATURE_________________________________  NURSE SIGNATURE__________________________________  ________________________________________________________________________

## 2019-05-08 ENCOUNTER — Encounter (HOSPITAL_COMMUNITY)
Admission: RE | Admit: 2019-05-08 | Discharge: 2019-05-08 | Disposition: A | Discharge status: Home / Self Care | Payer: BC Managed Care – PPO | Source: Ambulatory Visit | Attending: Obstetrics and Gynecology | Admitting: Obstetrics and Gynecology

## 2019-05-08 ENCOUNTER — Other Ambulatory Visit: Payer: Self-pay | Admitting: Adult Health

## 2019-05-08 ENCOUNTER — Other Ambulatory Visit: Payer: Self-pay

## 2019-05-08 ENCOUNTER — Encounter (HOSPITAL_COMMUNITY): Payer: Self-pay

## 2019-05-08 DIAGNOSIS — N920 Excessive and frequent menstruation with regular cycle: Secondary | ICD-10-CM | POA: Diagnosis not present

## 2019-05-08 DIAGNOSIS — F411 Generalized anxiety disorder: Secondary | ICD-10-CM

## 2019-05-08 DIAGNOSIS — N840 Polyp of corpus uteri: Secondary | ICD-10-CM | POA: Diagnosis not present

## 2019-05-08 DIAGNOSIS — R7303 Prediabetes: Secondary | ICD-10-CM | POA: Insufficient documentation

## 2019-05-08 DIAGNOSIS — Z87891 Personal history of nicotine dependence: Secondary | ICD-10-CM | POA: Diagnosis not present

## 2019-05-08 DIAGNOSIS — Z01812 Encounter for preprocedural laboratory examination: Secondary | ICD-10-CM | POA: Diagnosis not present

## 2019-05-08 DIAGNOSIS — F331 Major depressive disorder, recurrent, moderate: Secondary | ICD-10-CM

## 2019-05-08 DIAGNOSIS — Z79899 Other long term (current) drug therapy: Secondary | ICD-10-CM | POA: Insufficient documentation

## 2019-05-08 HISTORY — DX: Sleep apnea, unspecified: G47.30

## 2019-05-08 HISTORY — DX: Prediabetes: R73.03

## 2019-05-08 HISTORY — DX: Personal history of urinary calculi: Z87.442

## 2019-05-08 HISTORY — DX: Other complications of anesthesia, initial encounter: T88.59XA

## 2019-05-08 LAB — CBC
HCT: 41.6 % (ref 36.0–46.0)
Hemoglobin: 13.3 g/dL (ref 12.0–15.0)
MCH: 28.4 pg (ref 26.0–34.0)
MCHC: 32 g/dL (ref 30.0–36.0)
MCV: 88.7 fL (ref 80.0–100.0)
Platelets: 315 10*3/uL (ref 150–400)
RBC: 4.69 MIL/uL (ref 3.87–5.11)
RDW: 14.6 % (ref 11.5–15.5)
WBC: 9.2 10*3/uL (ref 4.0–10.5)
nRBC: 0 % (ref 0.0–0.2)

## 2019-05-08 LAB — BASIC METABOLIC PANEL
Anion gap: 9 (ref 5–15)
BUN: 12 mg/dL (ref 6–20)
CO2: 23 mmol/L (ref 22–32)
Calcium: 8.5 mg/dL — ABNORMAL LOW (ref 8.9–10.3)
Chloride: 105 mmol/L (ref 98–111)
Creatinine, Ser: 0.93 mg/dL (ref 0.44–1.00)
GFR calc Af Amer: >60 mL/min (ref >60)
GFR calc non Af Amer: >60 mL/min (ref >60)
Glucose, Bld: 96 mg/dL (ref 70–99)
Potassium: 4.4 mmol/L (ref 3.5–5.1)
Sodium: 137 mmol/L (ref 135–145)

## 2019-05-08 LAB — HEMOGLOBIN A1C
Hgb A1c MFr Bld: 5.8 % — ABNORMAL HIGH (ref 4.8–5.6)
Mean Plasma Glucose: 119.76 mg/dL

## 2019-05-08 LAB — ABO/RH: ABO/RH(D): A POS

## 2019-05-08 LAB — PREGNANCY, URINE: Preg Test, Ur: NEGATIVE

## 2019-05-08 NOTE — Telephone Encounter (Signed)
Has apt in the morning. Request is 90 day

## 2019-05-08 NOTE — Telephone Encounter (Signed)
Patient received MyChart message. Patient sent response seen below. Aware of need to discontinue Enbrel and Methotrexate now and can resume 2 weeks post op when sutures are out and no complications.  Jonette Pesa, RN 16 hours ago (6:29 PM)   Ok, I will discontinue those until after surgery.    Routing to provider and will close encounter.

## 2019-05-09 ENCOUNTER — Ambulatory Visit (INDEPENDENT_AMBULATORY_CARE_PROVIDER_SITE_OTHER): Payer: BC Managed Care – PPO | Admitting: Adult Health

## 2019-05-09 ENCOUNTER — Encounter: Payer: Self-pay | Admitting: Adult Health

## 2019-05-09 DIAGNOSIS — R4189 Other symptoms and signs involving cognitive functions and awareness: Secondary | ICD-10-CM

## 2019-05-09 DIAGNOSIS — R4689 Other symptoms and signs involving appearance and behavior: Secondary | ICD-10-CM

## 2019-05-09 DIAGNOSIS — F411 Generalized anxiety disorder: Secondary | ICD-10-CM

## 2019-05-09 DIAGNOSIS — G4700 Insomnia, unspecified: Secondary | ICD-10-CM

## 2019-05-09 DIAGNOSIS — F331 Major depressive disorder, recurrent, moderate: Secondary | ICD-10-CM | POA: Diagnosis not present

## 2019-05-09 MED ORDER — BUPROPION HCL ER (XL) 150 MG PO TB24: ORAL_TABLET | ORAL | 1 refills | Status: DC

## 2019-05-09 MED ORDER — DULOXETINE HCL 30 MG PO CPEP: ORAL_CAPSULE | ORAL | 1 refills | Status: DC

## 2019-05-09 NOTE — Progress Notes (Signed)
Olivia Sims [id number] May 19, 1970 49 y.o.  Subjective:   Patient ID:  Olivia Sims is a 49 y.o. (DOB 01-10-1971) female.  Chief Complaint: No chief complaint on file.   HPI Olivia Sims presents to the office today for follow-up of MDD, GAD, insomnia, cognitive and behavioral issues, and fatigue.  Describes mood today as "ok". Pleasant. Denies tearfulness. Mood symptoms - reports decreased depression, anxiety, and irritability. Stating "I am feeling better". Has returned to work. Upcoming hysterectomy - will be out of work 6 weeks. Working with therapist - sees weekly. Varying interest and motivation. Taking medications as prescribed.  Energy levels "better". Active, does not have a regular exercise routine. 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  Enjoys some usual interests and activities. Single. Lives with sister. Mother and father local.  Appetite adequate. Weight stable - 224 pounds. Sleeps better some nights than others. Averages 6 to 7. Waking up during the night and able to get to sleep.   Focus and concentration better - "I have moments". Completing tasks. Managing some aspects of household. Reading.  Denies SI or HI. Denies AH or VH.  Past medications: Wellbutrin, Abilify   PHQ2-9     Office Visit from 01/08/2019 in [facility] Office Visit from 12/28/2018 in [facility]  PHQ-2 Total Score  2  2  PHQ-9 Total Score  13  10       Review of Systems:  Review of Systems  Musculoskeletal: Negative for gait problem.  Neurological: Negative for tremors.  Psychiatric/Behavioral:       Please refer to HPI    Medications: I have reviewed the patient's current medications.  Current Outpatient Medications  Medication Sig Dispense Refill  . albuterol (VENTOLIN HFA) 108 (90 Base) MCG/ACT inhaler Inhale 2 puffs into the lungs 4 (four) times daily as needed.    Marland Kitchen buPROPion (WELLBUTRIN XL) 150 MG 24 hr tablet Take one tablet every morning x 7 days,  then take two tablets every morning. (Patient taking differently: Take 300 mg by mouth daily. Take one tablet every morning x 7 days, then take two tablets every morning.) 60 tablet 2  . DULoxetine (CYMBALTA) 30 MG capsule TAKE 3 CAPSULES BY MOUTH EVERY DAY 270 capsule 1  . etanercept (ENBREL) 50 MG/ML injection Inject 50 mg into the skin once a week.    . fexofenadine (ALLEGRA) 180 MG tablet Take 180 mg by mouth daily.    . folic acid (FOLVITE) 1 MG tablet Take 1 mg by mouth daily.    . methotrexate 50 MG/2ML injection Inject 50 mg into the muscle once a week.    . Vitamin D, Ergocalciferol, (DRISDOL) 1.25 MG (50000 UT) CAPS capsule Take 1 capsule (50,000 Units total) by mouth every 7 (seven) days. 8 capsule 0   No current facility-administered medications for this visit.    Medication Side Effects: None  Allergies: No Known Allergies  Past Medical History:  Diagnosis Date  . Complication of anesthesia    bladder " froze' years ago per pt   . Depression   . Elevated LDL cholesterol level   . Endometrial polyp 2021  . Exercise-induced asthma    pt denies at 4/20/visit   . History of kidney stones   . Kidney stones   . Pre-diabetes   . Prediabetes   . Rheumatoid arthritis (HCC)   . Right ovarian cyst 2021  . Sleep apnea    hx of had surgery to correct   .  Urinary incontinence   . Vitamin D deficiency     Family History  Problem Relation Age of Onset  . Diabetes Mother   . Hypothyroidism Mother   . Heart Problems Mother        pacemaker   . Cancer Mother        endometrial ca?  . Thyroid disease Mother   . Hypertension Father   . Diabetes Father   . Non-Hodgkin's lymphoma Paternal Grandmother     Social History   Socioeconomic History  . Marital status: Single    Spouse name: Not on file  . Number of children: 0  . Years of education: 27  . Highest education level: Not on file  Occupational History  . Not on file  Tobacco Use  . Smoking status: Former Smoker     Packs/day: 1.00    Years: 16.00    Pack years: 16.00    Quit date: 2002    Years since quitting: 19.3  . Smokeless tobacco: Never Used  Substance and Sexual Activity  . Alcohol use: Yes    Alcohol/week: 1.0 standard drinks    Types: 1 Glasses of wine per week    Comment: once a week  . Drug use: Never  . Sexual activity: Not Currently    Birth control/protection: Abstinence  Other Topics Concern  . Not on file  Social History Narrative  . Not on file   Social Determinants of Health   Financial Resource Strain:   . Difficulty of Paying Living Expenses:   Food Insecurity:   . Worried About Charity fundraiser in the Last Year:   . Arboriculturist in the Last Year:   Transportation Needs:   . Film/video editor (Medical):   Marland Kitchen Lack of Transportation (Non-Medical):   Physical Activity:   . Days of Exercise per Week:   . Minutes of Exercise per Session:   Stress:   . Feeling of Stress :   Social Connections:   . Frequency of Communication with Friends and Family:   . Frequency of Social Gatherings with Friends and Family:   . Attends Religious Services:   . Active Member of Clubs or Organizations:   . Attends Archivist Meetings:   Marland Kitchen Marital Status:   Intimate Partner Violence:   . Fear of Current or Ex-Partner:   . Emotionally Abused:   Marland Kitchen Physically Abused:   . Sexually Abused:     Past Medical History, Surgical history, Social history, and Family history were reviewed and updated as appropriate.   Please see review of systems for further details on the patient's review from today.   Objective:   Physical Exam:  LMP 04/19/2019 (Exact Date)   Physical Exam Constitutional:      General: She is not in acute distress. Musculoskeletal:        General: No deformity.  Neurological:     Mental Status: She is alert and oriented to person, place, and time.     Coordination: Coordination normal.  Psychiatric:        Attention and Perception:  Attention and perception normal. She does not perceive auditory or visual hallucinations.        Mood and Affect: Mood is anxious and depressed. Affect is not labile, blunt, angry or inappropriate.        Speech: Speech normal.        Behavior: Behavior normal.        Thought Content: Thought content  normal. Thought content is not paranoid or delusional. Thought content does not include homicidal or suicidal ideation. Thought content does not include homicidal or suicidal plan.        Cognition and Memory: Cognition and memory normal.        Judgment: Judgment normal.     Comments: Insight intact     Lab Review:     Component Value Date/Time   NA 137 05/08/2019 1204   NA 136 01/08/2019 0914   K 4.4 05/08/2019 1204   CL 105 05/08/2019 1204   CO2 23 05/08/2019 1204   GLUCOSE 96 05/08/2019 1204   BUN 12 05/08/2019 1204   BUN 13 01/08/2019 0914   CREATININE 0.93 05/08/2019 1204   CALCIUM 8.5 (L) 05/08/2019 1204   PROT 7.0 01/08/2019 0914   ALBUMIN 4.2 01/08/2019 0914   AST 21 01/08/2019 0914   ALT 20 01/08/2019 0914   ALKPHOS 83 01/08/2019 0914   BILITOT 0.5 01/08/2019 0914   GFRNONAA >60 05/08/2019 1204   GFRAA >60 05/08/2019 1204       Component Value Date/Time   WBC 9.2 05/08/2019 1204   RBC 4.69 05/08/2019 1204   HGB 13.3 05/08/2019 1204   HGB 13.8 01/08/2019 0914   HCT 41.6 05/08/2019 1204   HCT 40.6 01/08/2019 0914   PLT 315 05/08/2019 1204   PLT 316 01/08/2019 0914   MCV 88.7 05/08/2019 1204   MCV 86 01/08/2019 0914   MCH 28.4 05/08/2019 1204   MCHC 32.0 05/08/2019 1204   RDW 14.6 05/08/2019 1204   RDW 13.0 01/08/2019 0914   LYMPHSABS 1.9 01/08/2019 0914   EOSABS 0.2 01/08/2019 0914   BASOSABS 0.1 01/08/2019 0914    No results found for: POCLITH, LITHIUM   No results found for: PHENYTOIN, PHENOBARB, VALPROATE, CBMZ   .res Assessment: Plan:    Plan:  Wellbutrin XL 150mg  daily  Cymbalta 90mg  capsule  Working with individual therapist.  RTC 3  months  Patient advised to contact office with any questions, adverse effects, or acute worsening in signs and symptoms.  Discussed potential metabolic side effects associated with atypical antipsychotics, as well as potential risk for movement side effects. Advised pt to contact office if movement side effects occur.    There are no diagnoses linked to this encounter.   Please see After Visit Summary for patient specific instructions.  Future Appointments  Date Time Provider Department Center  05/10/2019  3:00 PM [facility] None  05/21/2019  1:00 PM 05/12/2019, MD [facility] None  06/25/2019  2:00 PM Patton Salles, MD [facility] None  01/09/2020  9:30 AM Patton Salles, NP-C [facility]    No orders of the defined types were placed in this encounter.   -------------------------------

## 2019-05-10 ENCOUNTER — Other Ambulatory Visit (HOSPITAL_COMMUNITY)
Admission: RE | Admit: 2019-05-10 | Discharge: 2019-05-10 | Disposition: A | Discharge status: Home / Self Care | Payer: BC Managed Care – PPO | Source: Ambulatory Visit | Attending: Obstetrics and Gynecology | Admitting: Obstetrics and Gynecology

## 2019-05-10 DIAGNOSIS — Z20822 Contact with and (suspected) exposure to covid-19: Secondary | ICD-10-CM | POA: Diagnosis not present

## 2019-05-10 DIAGNOSIS — Z01812 Encounter for preprocedural laboratory examination: Secondary | ICD-10-CM | POA: Insufficient documentation

## 2019-05-10 LAB — SARS CORONAVIRUS 2 (TAT 6-24 HRS): SARS Coronavirus 2: NEGATIVE

## 2019-05-14 ENCOUNTER — Ambulatory Visit (HOSPITAL_BASED_OUTPATIENT_CLINIC_OR_DEPARTMENT_OTHER): Payer: BC Managed Care – PPO | Admitting: Certified Registered"

## 2019-05-14 ENCOUNTER — Encounter (HOSPITAL_BASED_OUTPATIENT_CLINIC_OR_DEPARTMENT_OTHER)
Admission: RE | Disposition: A | Discharge status: Home / Self Care | Payer: Self-pay | Source: Home / Self Care | Attending: Obstetrics and Gynecology

## 2019-05-14 ENCOUNTER — Observation Stay (HOSPITAL_BASED_OUTPATIENT_CLINIC_OR_DEPARTMENT_OTHER)
Admission: RE | Admit: 2019-05-14 | Discharge: 2019-05-14 | Disposition: A | Discharge status: Home / Self Care | Payer: BC Managed Care – PPO | Attending: Obstetrics and Gynecology | Admitting: Obstetrics and Gynecology

## 2019-05-14 ENCOUNTER — Other Ambulatory Visit: Payer: Self-pay

## 2019-05-14 ENCOUNTER — Encounter (HOSPITAL_BASED_OUTPATIENT_CLINIC_OR_DEPARTMENT_OTHER): Payer: Self-pay | Admitting: Obstetrics and Gynecology

## 2019-05-14 DIAGNOSIS — Z90710 Acquired absence of both cervix and uterus: Secondary | ICD-10-CM

## 2019-05-14 DIAGNOSIS — Z87891 Personal history of nicotine dependence: Secondary | ICD-10-CM | POA: Insufficient documentation

## 2019-05-14 DIAGNOSIS — Z6839 Body mass index (BMI) 39.0-39.9, adult: Secondary | ICD-10-CM | POA: Diagnosis not present

## 2019-05-14 DIAGNOSIS — N83511 Torsion of right ovary and ovarian pedicle: Secondary | ICD-10-CM | POA: Diagnosis not present

## 2019-05-14 DIAGNOSIS — M1990 Unspecified osteoarthritis, unspecified site: Secondary | ICD-10-CM | POA: Diagnosis not present

## 2019-05-14 DIAGNOSIS — N920 Excessive and frequent menstruation with regular cycle: Secondary | ICD-10-CM | POA: Diagnosis not present

## 2019-05-14 DIAGNOSIS — G4730 Sleep apnea, unspecified: Secondary | ICD-10-CM | POA: Insufficient documentation

## 2019-05-14 DIAGNOSIS — N840 Polyp of corpus uteri: Secondary | ICD-10-CM | POA: Diagnosis not present

## 2019-05-14 DIAGNOSIS — N838 Other noninflammatory disorders of ovary, fallopian tube and broad ligament: Secondary | ICD-10-CM | POA: Diagnosis not present

## 2019-05-14 DIAGNOSIS — D251 Intramural leiomyoma of uterus: Secondary | ICD-10-CM | POA: Diagnosis not present

## 2019-05-14 DIAGNOSIS — N921 Excessive and frequent menstruation with irregular cycle: Secondary | ICD-10-CM | POA: Diagnosis not present

## 2019-05-14 HISTORY — PX: OVARIAN CYST REMOVAL: SHX89

## 2019-05-14 HISTORY — PX: CYSTOSCOPY: SHX5120

## 2019-05-14 HISTORY — PX: TOTAL LAPAROSCOPIC HYSTERECTOMY WITH SALPINGECTOMY: SHX6742

## 2019-05-14 LAB — TYPE AND SCREEN
ABO/RH(D): A POS
Antibody Screen: NEGATIVE

## 2019-05-14 LAB — POCT PREGNANCY, URINE: Preg Test, Ur: NEGATIVE

## 2019-05-14 LAB — CBC
HCT: 40.5 % (ref 36.0–46.0)
Hemoglobin: 13 g/dL (ref 12.0–15.0)
MCH: 28.6 pg (ref 26.0–34.0)
MCHC: 32.1 g/dL (ref 30.0–36.0)
MCV: 89 fL (ref 80.0–100.0)
Platelets: 265 10*3/uL (ref 150–400)
RBC: 4.55 MIL/uL (ref 3.87–5.11)
RDW: 14.6 % (ref 11.5–15.5)
WBC: 16 10*3/uL — ABNORMAL HIGH (ref 4.0–10.5)
nRBC: 0 % (ref 0.0–0.2)

## 2019-05-14 SURGERY — HYSTERECTOMY, TOTAL, LAPAROSCOPIC, WITH SALPINGECTOMY
Anesthesia: General | Site: Urethra | Laterality: Right

## 2019-05-14 MED ORDER — KETOROLAC TROMETHAMINE 30 MG/ML IJ SOLN
INTRAMUSCULAR | Status: DC | PRN
Start: 2019-05-14 — End: 2019-05-14
  Administered 2019-05-14: 30 mg via INTRAVENOUS

## 2019-05-14 MED ORDER — OXYCODONE HCL 5 MG PO TABS
5.0000 mg | ORAL_TABLET | Freq: Once | ORAL | Status: AC | PRN
  Administered 2019-05-14: 5 mg via ORAL

## 2019-05-14 MED ORDER — LIDOCAINE 2% (20 MG/ML) 5 ML SYRINGE
INTRAMUSCULAR | Status: AC
  Filled 2019-05-14: qty 5

## 2019-05-14 MED ORDER — MENTHOL 3 MG MT LOZG: 1.0000 | LOZENGE | OROMUCOSAL | Status: DC | PRN

## 2019-05-14 MED ORDER — ACETAMINOPHEN 500 MG PO TABS
ORAL_TABLET | ORAL | Status: AC
  Filled 2019-05-14: qty 2

## 2019-05-14 MED ORDER — PROPOFOL 10 MG/ML IV BOLUS
INTRAVENOUS | Status: DC | PRN
  Administered 2019-05-14: 200 mg via INTRAVENOUS

## 2019-05-14 MED ORDER — HYDROMORPHONE HCL 1 MG/ML IJ SOLN
INTRAMUSCULAR | Status: AC
  Filled 2019-05-14: qty 1

## 2019-05-14 MED ORDER — OXYCODONE HCL 5 MG/5ML PO SOLN: 5.0000 mg | Freq: Once | ORAL | Status: AC | PRN

## 2019-05-14 MED ORDER — PROPOFOL 10 MG/ML IV BOLUS
INTRAVENOUS | Status: AC
  Filled 2019-05-14: qty 20

## 2019-05-14 MED ORDER — SODIUM CHLORIDE 0.9 % IV SOLN
INTRAVENOUS | Status: AC
  Filled 2019-05-14: qty 2

## 2019-05-14 MED ORDER — KETOROLAC TROMETHAMINE 30 MG/ML IJ SOLN
INTRAMUSCULAR | Status: AC
  Filled 2019-05-14: qty 1

## 2019-05-14 MED ORDER — DROPERIDOL 2.5 MG/ML IJ SOLN
INTRAMUSCULAR | Status: DC | PRN
  Administered 2019-05-14: .625 mg via INTRAVENOUS

## 2019-05-14 MED ORDER — MIDAZOLAM HCL 5 MG/5ML IJ SOLN
INTRAMUSCULAR | Status: DC | PRN
  Administered 2019-05-14: 2 mg via INTRAVENOUS

## 2019-05-14 MED ORDER — BSS IO SOLN: 15.0000 mL | Freq: Once | INTRAOCULAR | Status: DC

## 2019-05-14 MED ORDER — KETOROLAC TROMETHAMINE 0.5 % OP SOLN
1.0000 [drp] | Freq: Three times a day (TID) | OPHTHALMIC | Status: DC | PRN
  Administered 2019-05-14: 1 [drp] via OPHTHALMIC
  Filled 2019-05-14: qty 3

## 2019-05-14 MED ORDER — ACETAMINOPHEN 500 MG PO TABS
1000.0000 mg | ORAL_TABLET | Freq: Once | ORAL | Status: AC
  Administered 2019-05-14: 07:00:00 1000 mg via ORAL

## 2019-05-14 MED ORDER — FENTANYL CITRATE (PF) 100 MCG/2ML IJ SOLN
INTRAMUSCULAR | Status: DC | PRN
  Administered 2019-05-14: 25 ug via INTRAVENOUS
  Administered 2019-05-14 (×3): 50 ug via INTRAVENOUS
  Administered 2019-05-14 (×2): 25 ug via INTRAVENOUS

## 2019-05-14 MED ORDER — DEXAMETHASONE SODIUM PHOSPHATE 10 MG/ML IJ SOLN
INTRAMUSCULAR | Status: DC | PRN
  Administered 2019-05-14: 5 mg via INTRAVENOUS

## 2019-05-14 MED ORDER — LACTATED RINGERS IV SOLN: INTRAVENOUS | Status: DC

## 2019-05-14 MED ORDER — OXYCODONE HCL 5 MG PO TABS
ORAL_TABLET | ORAL | Status: AC
  Filled 2019-05-14: qty 1

## 2019-05-14 MED ORDER — DULOXETINE HCL 30 MG PO CPEP
90.0000 mg | ORAL_CAPSULE | Freq: Every day | ORAL | Status: DC
  Filled 2019-05-14: qty 3

## 2019-05-14 MED ORDER — ONDANSETRON HCL 4 MG/2ML IJ SOLN
INTRAMUSCULAR | Status: DC | PRN
Start: 2019-05-14 — End: 2019-05-14
  Administered 2019-05-14 (×2): 4 mg via INTRAVENOUS

## 2019-05-14 MED ORDER — SODIUM CHLORIDE 0.9 % IR SOLN
Status: DC | PRN
  Administered 2019-05-14: 1000 mL via INTRAVESICAL
  Administered 2019-05-14: 3000 mL

## 2019-05-14 MED ORDER — ENOXAPARIN SODIUM 40 MG/0.4ML ~~LOC~~ SOLN
40.0000 mg | SUBCUTANEOUS | Status: AC
  Administered 2019-05-14: 40 mg via SUBCUTANEOUS

## 2019-05-14 MED ORDER — MORPHINE SULFATE (PF) 4 MG/ML IV SOLN: 1.0000 mg | INTRAVENOUS | Status: DC | PRN

## 2019-05-14 MED ORDER — SCOPOLAMINE 1 MG/3DAYS TD PT72
1.0000 | MEDICATED_PATCH | TRANSDERMAL | Status: DC
  Administered 2019-05-14: 07:00:00 1.5 mg via TRANSDERMAL

## 2019-05-14 MED ORDER — BUPIVACAINE HCL (PF) 0.25 % IJ SOLN
INTRAMUSCULAR | Status: DC | PRN
  Administered 2019-05-14: 18 mL

## 2019-05-14 MED ORDER — PHENYLEPHRINE 40 MCG/ML (10ML) SYRINGE FOR IV PUSH (FOR BLOOD PRESSURE SUPPORT)
PREFILLED_SYRINGE | INTRAVENOUS | Status: AC
  Filled 2019-05-14: qty 10

## 2019-05-14 MED ORDER — SCOPOLAMINE 1 MG/3DAYS TD PT72
MEDICATED_PATCH | TRANSDERMAL | Status: AC
  Filled 2019-05-14: qty 1

## 2019-05-14 MED ORDER — DEXAMETHASONE SODIUM PHOSPHATE 10 MG/ML IJ SOLN
INTRAMUSCULAR | Status: AC
  Filled 2019-05-14: qty 1

## 2019-05-14 MED ORDER — ENOXAPARIN SODIUM 40 MG/0.4ML ~~LOC~~ SOLN
SUBCUTANEOUS | Status: AC
  Filled 2019-05-14: qty 0.4

## 2019-05-14 MED ORDER — ROCURONIUM BROMIDE 100 MG/10ML IV SOLN
INTRAVENOUS | Status: DC | PRN
  Administered 2019-05-14: 10 mg via INTRAVENOUS
  Administered 2019-05-14: 20 mg via INTRAVENOUS
  Administered 2019-05-14: 70 mg via INTRAVENOUS

## 2019-05-14 MED ORDER — SODIUM CHLORIDE 0.9 % IV SOLN
2.0000 g | INTRAVENOUS | Status: AC
  Administered 2019-05-14: 08:00:00 2 g via INTRAVENOUS

## 2019-05-14 MED ORDER — IBUPROFEN 800 MG PO TABS: 800.0000 mg | ORAL_TABLET | Freq: Three times a day (TID) | ORAL | 0 refills | Status: DC | PRN

## 2019-05-14 MED ORDER — ONDANSETRON HCL 4 MG/2ML IJ SOLN: 4.0000 mg | Freq: Four times a day (QID) | INTRAMUSCULAR | Status: DC | PRN

## 2019-05-14 MED ORDER — MIDAZOLAM HCL 2 MG/2ML IJ SOLN
INTRAMUSCULAR | Status: AC
  Filled 2019-05-14: qty 2

## 2019-05-14 MED ORDER — POLYMYXIN B-TRIMETHOPRIM 10000-0.1 UNIT/ML-% OP SOLN
1.0000 [drp] | Freq: Three times a day (TID) | OPHTHALMIC | Status: DC
  Administered 2019-05-14: 1 [drp] via OPHTHALMIC
  Filled 2019-05-14: qty 10

## 2019-05-14 MED ORDER — HYDROMORPHONE HCL 1 MG/ML IJ SOLN
0.2500 mg | INTRAMUSCULAR | Status: DC | PRN
  Administered 2019-05-14 (×2): 0.25 mg via INTRAVENOUS

## 2019-05-14 MED ORDER — LIDOCAINE 2% (20 MG/ML) 5 ML SYRINGE
INTRAMUSCULAR | Status: DC | PRN
  Administered 2019-05-14: 80 mg via INTRAVENOUS

## 2019-05-14 MED ORDER — KETOROLAC TROMETHAMINE 30 MG/ML IJ SOLN
30.0000 mg | Freq: Four times a day (QID) | INTRAMUSCULAR | Status: DC
  Administered 2019-05-14: 30 mg via INTRAVENOUS

## 2019-05-14 MED ORDER — EPHEDRINE SULFATE-NACL 50-0.9 MG/10ML-% IV SOSY
PREFILLED_SYRINGE | INTRAVENOUS | Status: DC | PRN
  Administered 2019-05-14 (×3): 5 mg via INTRAVENOUS

## 2019-05-14 MED ORDER — SODIUM CHLORIDE 0.9 % IV SOLN
INTRAVENOUS | Status: DC | PRN
  Administered 2019-05-14: 60 mL

## 2019-05-14 MED ORDER — FENTANYL CITRATE (PF) 250 MCG/5ML IJ SOLN
INTRAMUSCULAR | Status: AC
  Filled 2019-05-14: qty 5

## 2019-05-14 MED ORDER — OXYCODONE-ACETAMINOPHEN 5-325 MG PO TABS
ORAL_TABLET | ORAL | Status: AC
  Filled 2019-05-14: qty 1

## 2019-05-14 MED ORDER — OXYCODONE-ACETAMINOPHEN 5-325 MG PO TABS: 1.0000 | ORAL_TABLET | ORAL | 0 refills | Status: DC | PRN

## 2019-05-14 MED ORDER — OXYCODONE-ACETAMINOPHEN 5-325 MG PO TABS
1.0000 | ORAL_TABLET | ORAL | Status: DC | PRN
  Administered 2019-05-14: 1 via ORAL

## 2019-05-14 MED ORDER — ONDANSETRON HCL 4 MG PO TABS: 4.0000 mg | ORAL_TABLET | Freq: Four times a day (QID) | ORAL | Status: DC | PRN

## 2019-05-14 MED ORDER — BUPROPION HCL ER (XL) 150 MG PO TB24
150.0000 mg | ORAL_TABLET | Freq: Every day | ORAL | Status: DC
  Filled 2019-05-14: qty 1

## 2019-05-14 MED ORDER — LORATADINE 10 MG PO TABS: 10.0000 mg | ORAL_TABLET | Freq: Every day | ORAL | Status: DC

## 2019-05-14 MED ORDER — FOLIC ACID 1 MG PO TABS
1.0000 mg | ORAL_TABLET | Freq: Every day | ORAL | Status: DC
  Filled 2019-05-14: qty 1

## 2019-05-14 MED ORDER — IBUPROFEN 800 MG PO TABS: 800.0000 mg | ORAL_TABLET | Freq: Three times a day (TID) | ORAL | Status: DC | PRN

## 2019-05-14 MED ORDER — SUGAMMADEX SODIUM 200 MG/2ML IV SOLN
INTRAVENOUS | Status: DC | PRN
  Administered 2019-05-14: 200 mg via INTRAVENOUS

## 2019-05-14 MED ORDER — PHENYLEPHRINE 40 MCG/ML (10ML) SYRINGE FOR IV PUSH (FOR BLOOD PRESSURE SUPPORT)
PREFILLED_SYRINGE | INTRAVENOUS | Status: DC | PRN
  Administered 2019-05-14 (×4): 40 ug via INTRAVENOUS

## 2019-05-14 MED ORDER — ONDANSETRON HCL 4 MG/2ML IJ SOLN
INTRAMUSCULAR | Status: AC
  Filled 2019-05-14: qty 4

## 2019-05-14 MED ORDER — ROCURONIUM BROMIDE 10 MG/ML (PF) SYRINGE
PREFILLED_SYRINGE | INTRAVENOUS | Status: AC
  Filled 2019-05-14: qty 10

## 2019-05-14 MED ORDER — ALBUTEROL SULFATE HFA 108 (90 BASE) MCG/ACT IN AERS: 2.0000 | INHALATION_SPRAY | Freq: Four times a day (QID) | RESPIRATORY_TRACT | Status: DC | PRN

## 2019-05-14 MED ORDER — KETOROLAC TROMETHAMINE 30 MG/ML IJ SOLN: 30.0000 mg | Freq: Once | INTRAMUSCULAR | Status: DC | PRN

## 2019-05-14 SURGICAL SUPPLY — 64 items
ADH SKN CLS APL DERMABOND .7 (GAUZE/BANDAGES/DRESSINGS) ×3
APL SRG 38 LTWT LNG FL B (MISCELLANEOUS) ×3
APPLICATOR ARISTA FLEXITIP XL (MISCELLANEOUS) ×1 IMPLANT
BAG SPEC RTRVL LRG 6X4 10 (ENDOMECHANICALS) ×3
BARRIER ADHS 3X4 INTERCEED (GAUZE/BANDAGES/DRESSINGS) IMPLANT
BRR ADH 4X3 ABS CNTRL BYND (GAUZE/BANDAGES/DRESSINGS)
CABLE HIGH FREQUENCY MONO STRZ (ELECTRODE) IMPLANT
CANISTER SUCT 3000ML PPV (MISCELLANEOUS) ×4 IMPLANT
CELL SAVER LIPIGURD (MISCELLANEOUS) IMPLANT
COVER BACK TABLE 60X90IN (DRAPES) IMPLANT
COVER MAYO STAND STRL (DRAPES) ×4 IMPLANT
COVER WAND RF STERILE (DRAPES) ×4 IMPLANT
DECANTER SPIKE VIAL GLASS SM (MISCELLANEOUS) ×8 IMPLANT
DERMABOND ADVANCED (GAUZE/BANDAGES/DRESSINGS) ×1
DERMABOND ADVANCED .7 DNX12 (GAUZE/BANDAGES/DRESSINGS) ×3 IMPLANT
DEVICE RETRIEVAL ALEXIS 14 (MISCELLANEOUS) IMPLANT
DURAPREP 26ML APPLICATOR (WOUND CARE) ×4 IMPLANT
EXTRT SYSTEM ALEXIS 14CM (MISCELLANEOUS)
EXTRT SYSTEM ALEXIS 17CM (MISCELLANEOUS)
GAUZE 4X4 16PLY RFD (DISPOSABLE) ×4 IMPLANT
GLOVE BIO SURGEON STRL SZ 6.5 (GLOVE) ×9 IMPLANT
GLOVE BIOGEL PI IND STRL 7.0 (GLOVE) ×9 IMPLANT
GLOVE BIOGEL PI INDICATOR 7.0 (GLOVE) ×3
GOWN STRL REUS W/TWL LRG LVL3 (GOWN DISPOSABLE) ×16 IMPLANT
HEMOSTAT ARISTA ABSORB 3G PWDR (HEMOSTASIS) ×1 IMPLANT
HOLDER FOLEY CATH W/STRAP (MISCELLANEOUS) IMPLANT
LIGASURE VESSEL 5MM BLUNT TIP (ELECTROSURGICAL) ×4 IMPLANT
NEEDLE INSUFFLATION 120MM (ENDOMECHANICALS) ×4 IMPLANT
OCCLUDER COLPOPNEUMO (BALLOONS) ×4 IMPLANT
PACK LAPAROSCOPY BASIN (CUSTOM PROCEDURE TRAY) ×4 IMPLANT
PACK TRENDGUARD 450 HYBRID PRO (MISCELLANEOUS) IMPLANT
POUCH LAPAROSCOPIC INSTRUMENT (MISCELLANEOUS) ×4 IMPLANT
POUCH SPECIMEN RETRIEVAL 10MM (ENDOMECHANICALS) ×1 IMPLANT
PROTECTOR NERVE ULNAR (MISCELLANEOUS) ×8 IMPLANT
RETRACTOR WOUND ALXS 19CM XSML (INSTRUMENTS) IMPLANT
RTRCTR WOUND ALEXIS 19CM XSML (INSTRUMENTS)
SCISSORS LAP 5X35 DISP (ENDOMECHANICALS) IMPLANT
SET IRRIG TUBING LAPAROSCOPIC (IRRIGATION / IRRIGATOR) ×4 IMPLANT
SET IRRIG Y TYPE TUR BLADDER L (SET/KITS/TRAYS/PACK) ×4 IMPLANT
SET TRI-LUMEN FLTR TB AIRSEAL (TUBING) ×4 IMPLANT
SHEARS HARMONIC ACE PLUS 36CM (ENDOMECHANICALS) ×5 IMPLANT
SLEEVE ADV FIXATION 5X100MM (TROCAR) ×4 IMPLANT
SUT VIC AB 0 CT1 27 (SUTURE) ×8
SUT VIC AB 0 CT1 27XBRD ANBCTR (SUTURE) ×6 IMPLANT
SUT VICRYL 0 UR6 27IN ABS (SUTURE) ×1 IMPLANT
SUT VICRYL 4-0 PS2 18IN ABS (SUTURE) ×5 IMPLANT
SUT VLOC 180 0 9IN  GS21 (SUTURE)
SUT VLOC 180 0 9IN GS21 (SUTURE) IMPLANT
SYR 10ML LL (SYRINGE) ×4 IMPLANT
SYR 50ML LL SCALE MARK (SYRINGE) ×8 IMPLANT
SYSTEM CARTER THOMASON II (TROCAR) IMPLANT
SYSTEM CONTND EXTRCTN KII BLLN (MISCELLANEOUS) IMPLANT
TIP RUMI ORANGE 6.7MMX12CM (TIP) IMPLANT
TIP UTERINE 5.1X6CM LAV DISP (MISCELLANEOUS) IMPLANT
TIP UTERINE 6.7X10CM GRN DISP (MISCELLANEOUS) IMPLANT
TIP UTERINE 6.7X6CM WHT DISP (MISCELLANEOUS) IMPLANT
TIP UTERINE 6.7X8CM BLUE DISP (MISCELLANEOUS) ×1 IMPLANT
TOWEL OR 17X26 10 PK STRL BLUE (TOWEL DISPOSABLE) ×8 IMPLANT
TRAY FOLEY W/BAG SLVR 14FR (SET/KITS/TRAYS/PACK) ×4 IMPLANT
TRENDGUARD 450 HYBRID PRO PACK (MISCELLANEOUS) ×4
TROCAR ADV FIXATION 5X100MM (TROCAR) ×4 IMPLANT
TROCAR BLADELESS OPT 5 100 (ENDOMECHANICALS) ×4 IMPLANT
TROCAR PORT AIRSEAL 8X120 (TROCAR) ×4 IMPLANT
WARMER LAPAROSCOPE (MISCELLANEOUS) ×4 IMPLANT

## 2019-05-14 NOTE — Anesthesia Preprocedure Evaluation (Addendum)
Anesthesia Evaluation  Patient identified by MRN, date of birth, ID band Patient awake    Reviewed: Allergy & Precautions, NPO status , Patient's Chart, lab work & pertinent test results  Airway Mallampati: I  TM Distance: <3 FB Neck ROM: Full    Dental no notable dental hx.    Pulmonary sleep apnea , former smoker,    Pulmonary exam normal breath sounds clear to auscultation       Cardiovascular negative cardio ROS Normal cardiovascular exam Rhythm:Regular Rate:Normal     Neuro/Psych negative neurological ROS  negative psych ROS   GI/Hepatic negative GI ROS, Neg liver ROS,   Endo/Other  Morbid obesity  Renal/GU negative Renal ROS  negative genitourinary   Musculoskeletal  (+) Arthritis , Rheumatoid disorders,    Abdominal   Peds negative pediatric ROS (+)  Hematology negative hematology ROS (+)   Anesthesia Other Findings   Reproductive/Obstetrics negative OB ROS                            Anesthesia Physical Anesthesia Plan  ASA: III  Anesthesia Plan: General   Post-op Pain Management:    Induction: Intravenous  PONV Risk Score and Plan: 3 and Ondansetron, Dexamethasone, Treatment may vary due to age or medical condition, Droperidol and Scopolamine patch - Pre-op  Airway Management Planned: Oral ETT  Additional Equipment:   Intra-op Plan:   Post-operative Plan: Extubation in OR  Informed Consent: I have reviewed the patients History and Physical, chart, labs and discussed the procedure including the risks, benefits and alternatives for the proposed anesthesia with the patient or authorized representative who has indicated his/her understanding and acceptance.     Dental advisory given  Plan Discussed with: CRNA and Surgeon  Anesthesia Plan Comments:        Anesthesia Quick Evaluation

## 2019-05-14 NOTE — Progress Notes (Signed)
Pt complaining of left eye irritation and tearing. NS to left eye but still irritating to pt. Dr. Okey Dupre made aware, in to see pt and orders received.

## 2019-05-14 NOTE — Addendum Note (Signed)
Addendum  created 05/14/19 1342 by Eilene Ghazi, MD   Order list changed

## 2019-05-14 NOTE — Op Note (Signed)
OPERATIVE REPORT  PREOPERATIVE DIAGNOSIS:  Menorrhagia with irregular menses, right adnexal cyst  POSTOPERATIVE DIAGNOSIS:   Menorrhagia with irregular menses, right adnexal cyst with torsion.  PROCEDURES:  Total laparoscopic hysterectomy with bilateral salpingectomy, excision of right adnexal cyst, collection of pelvic washings, cystoscopy  SURGEON:  Randye Lobo, M.D.  ASSISTANT:   Annamaria Boots, M.D.  ANESTHESIA:  General endotracheal, intraperitoneal ropivicaine 30 mL diluted in 30 mL of normal saline, local with 0.25% Marcaine.  IVF:   1500 cc LR  ESTIMATED BLOOD LOSS:   10 cc  URINE OUTPUT:   350 cc  COMPLICATIONS:  None.  INDICATIONS FOR THE PROCEDURE:     The patient presents with irregular and heavy menstrual bleeding.  On pelvic ultrasound she was noted to have a 5.7 cm adnexal cyst and a 9 mm submucosal fibroid.  An endometrial biopsy showed a benign endometrial polyp.  She declines medical therapy and future childbearing, and is requesting hysterectomy procedure with removal of the adnexal cyst.    A plan is made to proceed with a total laparoscopic hysterectomy with bilateral salpingectomy, excision of the adnexal cyst, possible bilateral oophorectomy, collection of pelvic washings, and cystoscopy after risks, benefits, and alternatives are reviewed.  FINDINGS:     Laparoscopy revealed a normal uterus and ovaries.  The right fallopian tube contained a 6 to 7 cm paratubal cyst and the right fallopian tube was torsed.  The left fallopian tube was normal. The upper abdomen was demonstrated adhesions of the omentum and the liver to the anterior abdominal wall.   No endometriosis was seen in the abdomen or pelvis.  The appendix tip was normal.   Cystoscopy at the termination of the procedure showed the bladder to be normal throughout 360 degrees including the bladder dome and trigone. There was no evidence of any foreign body in the bladder or the urethra. Both of the  ureters were noted to be patent bilaterally.  SPECIMENS:     The uterus, cervix, bilateral tubes, and the right paratubal cyst were went to pathology separately from the pelvic washings.   DESCRIPTION OF PROCEDURE:    The patient was reidentified in the preoperative hold area.   She did receive Cefotetan for antibiotic prophylaxis.  She received Lovenox, TED hose and PAS stockings for DVT prophylaxis.  In the operating room, the patient was placed in the dorsal lithotomy position on the operating room table.  The Trendguard was used to support her on the OR table.  Her legs were placed in the Palmetto stirrups and her arms were both padded and tucked at her sides. The patient received general endotracheal anesthesia. The abdomen and vagina were then sterilely prepped and she was sterilely draped.  A speculum was placed in the vagina and a single-tooth tenaculum was placed on the anterior cervical lip.  A figure-of-eight suture of 0 Vicryl was placed on each the anterior cervical lip. The uterus was sounded to _ 8___ cm.  The cervix was then dilated with Hegar dilators.  A #  __8____ RUMI tip with a small metal KOH ring was then placed through the cervix and into the uterine cavity without difficulty.  The remaining vaginal instruments were then removed.  A Foley catheter was placed inside the bladder.  Attention was turned to the abdomen where the umbilical region was injected with 0.25% Marcaine and a small incision created.  A Veress needle was then used to insufflate the abdomen with CO2 gas after a saline drop  test was performed and the fluid flowed freely.  A 5 mm umbilical incision was created with a scalpel after the skin. A 5 mm camera port was then placed using the Optiview.  5 mm incisions were then created in the left upper abdomen and the left lower abdomen after the skin was injected locally with 0.25% Marcaine.  The 5 mm trocars were then placed under visualization of the laparoscope.  An 8  mm Air Seal trocar was placed in the right lower quadrant after injecting with Marcaine and incising with a scalpel.     Pelvic washings were collected and sent to Pathology.   Ropivicine was placed inside the peritoneal cavity.   The patient was placed in Trendelenburg position.  An inspection of the abdomen and pelvis was performed. The findings are as noted above.   The bilateral ureters were identified.  The right fallopian tube was grasped and the torsion was relieved.  The Ligasure was used to cauterize and cut through the mesosalpinx and then the proximal tube.  The specimen was left in the peritoneal cavity and later removed using an Endocatch bag placed vaginally into the peritoneal cavity after the colpotomy incision was created to remove the uterine specimen.   All specimens were sent to Pathology.  The right utero-ovarian ligament was similarly cauterized and cut with the Ligasure.  The right round ligament was then cauterized and divided with same instrument.  Dissection was performed to the anterior and posterior leaves of the broad ligaments using the Harmonic scalpel.  The incision was carried across the anterior cul- de-sac along the vesicouterine fold and the bladder was dissected away from the cervix using the Harmonic scalpel. The peritoneum was taken down posteriorly.  The right uterine artery was skeletonized.   It was later cauterized and cut with the Ligasure instrument.  Attention was turned to the patient's left hand side.  The same procedure that was performed on the right side was repeated on the left side with respect to the isolation, cautery, and transection of the vessels and the bladder flap dissection.   The left fallopian tube was removed from the peritoneal cavity.  The KOH ring was nicely visible.  The colpotomy incision was performed with the Harmonic scalpel in a circumferential fashion. The uterine specimen was then removed from the peritoneal cavity and was later  sent to Pathology.  The right fallopian tube and paratubal cyst were removed from the peritoneal cavity using an Endocatch bag as above.  The vaginal cuff was sutured using a running suture of 0 V-Loc.  The vagina was closed from the patient's right hand side to the left hand side and then back 2 sutures towards the midline.  This provided good full-thickness closure of the vaginal cuff.  The laparoscopic needle for suturing was removed from the peritoneal cavity.  The pelvis was irrigated and suctioned.   The pneumoperitoneal was let down.  There was good hemostasis of the operative sites and pedicles.  Mayme Genta was placed in the pelvis over the operating sites.   The 8 mm trocar was removed and the fascia was closed with a 0/0 vicryl running suture.   The remaining left sided laparoscopic and umbilical trocars were removed after the CO2 pneumoperitoneum was released and the patient received manual breaths.    The patient's Foley catheter was removed at this time and cystoscopy was performed and the findings are as noted above.  The Foley catheter was left out.   Final inspection  of the vagina demonstrated good hemostasis of the vaginal cuff.  All skin incisions were closed with subcuticular sutures of 4-0 Vicryl.  Dermabond was placed over the incisions.  This concluded the patient's procedure.  She was extubated and escorted to the recovery room in stable and awake condition.  There were no complications to the procedure.  All needle, instrument, and sponge counts were correct.

## 2019-05-14 NOTE — Discharge Instructions (Signed)
Post Anesthesia Home Care Instructions  Activity: Get plenty of rest for the remainder of the day. A responsible individual must stay with you for 24 hours following the procedure.  For the next 24 hours, DO NOT: -Drive a car -Operate machinery -Drink alcoholic beverages -Take any medication unless instructed by your physician -Make any legal decisions or sign important papers.  Meals: Start with liquid foods such as gelatin or soup. Progress to regular foods as tolerated. Avoid greasy, spicy, heavy foods. If nausea and/or vomiting occur, drink only clear liquids until the nausea and/or vomiting subsides. Call your physician if vomiting continues.  Special Instructions/Symptoms: Your throat may feel dry or sore from the anesthesia or the breathing tube placed in your throat during surgery. If this causes discomfort, gargle with warm salt water. The discomfort should disappear within 24 hours.  If you had a scopolamine patch placed behind your ear for the management of post- operative nausea and/or vomiting:  1. The medication in the patch is effective for 72 hours, after which it should be removed.  Wrap patch in a tissue and discard in the trash. Wash hands thoroughly with soap and water. 2. You may remove the patch earlier than 72 hours if you experience unpleasant side effects which may include dry mouth, dizziness or visual disturbances. 3. Avoid touching the patch. Wash your hands with soap and water after contact with the patch.    Laparoscopically Assisted Vaginal Hysterectomy, Care After This sheet gives you information about how to care for yourself after your procedure. Your health care provider may also give you more specific instructions. If you have problems or questions, contact your health care provider. What can I expect after the procedure? After the procedure, it is common to have:  Soreness and numbness in your incision areas.  Abdominal pain. You will be given pain  medicine to control it.  Vaginal bleeding and discharge. You will need to use a sanitary napkin after this procedure.  Sore throat from the breathing tube that was inserted during surgery. Follow these instructions at home: Medicines  Take over-the-counter and prescription medicines only as told by your health care provider.  Do not take aspirin or ibuprofen. These medicines can cause bleeding.  Do not drive or use heavy machinery while taking prescription pain medicine.  Do not drive for 24 hours if you were given a medicine to help you relax (sedative) during the procedure. Incision care   Follow instructions from your health care provider about how to take care of your incisions. Make sure you: ? Wash your hands with soap and water before you change your bandage (dressing). If soap and water are not available, use hand sanitizer. ? Change your dressing as told by your health care provider. ? Leave stitches (sutures), skin glue, or adhesive strips in place. These skin closures may need to stay in place for 2 weeks or longer. If adhesive strip edges start to loosen and curl up, you may trim the loose edges. Do not remove adhesive strips completely unless your health care provider tells you to do that.  Check your incision area every day for signs of infection. Check for: ? Redness, swelling, or pain. ? Fluid or blood. ? Warmth. ? Pus or a bad smell. Activity  Get regular exercise as told by your health care provider. You may be told to take short walks every day and go farther each time.  Return to your normal activities as told by your health care   provider. Ask your health care provider what activities are safe for you.  Do not douche, use tampons, or have sexual intercourse for at least 6 weeks, or until your health care provider gives you permission.  Do not lift anything that is heavier than 10 lb (4.5 kg), or the limit that your health care provider tells you, until he or  she says that it is safe. General instructions  Do not take baths, swim, or use a hot tub until your health care provider approves. Take showers instead of baths.  Do not drive for 24 hours if you received a sedative.  Do not drive or operate heavy machinery while taking prescription pain medicine.  To prevent or treat constipation while you are taking prescription pain medicine, your health care provider may recommend that you: ? Drink enough fluid to keep your urine clear or pale yellow. ? Take over-the-counter or prescription medicines. ? Eat foods that are high in fiber, such as fresh fruits and vegetables, whole grains, and beans. ? Limit foods that are high in fat and processed sugars, such as fried and sweet foods.  Keep all follow-up visits as told by your health care provider. This is important. Contact a health care provider if:  You have signs of infection, such as: ? Redness, swelling, or pain around your incision sites. ? Fluid or blood coming from an incision. ? An incision that feels warm to the touch. ? Pus or a bad smell coming from an incision.  Your incision breaks open.  Your pain medicine is not helping.  You feel dizzy or light-headed.  You have pain or bleeding when you urinate.  You have persistent nausea and vomiting.  You have blood, pus, or a bad-smelling discharge from your vagina. Get help right away if:  You have a fever.  You have severe abdominal pain.  You have chest pain.  You have shortness of breath.  You faint.  You have pain, swelling, or redness in your leg.  You have heavy bleeding from your vagina. Summary  After the procedure, it is common to have abdominal pain and vaginal bleeding.  You should not drive or lift heavy objects until your health care provider says that it is safe.  Contact your health care provider if you have any symptoms of infection, excessive vaginal bleeding, nausea, vomiting, or shortness of  breath. This information is not intended to replace advice given to you by your health care provider. Make sure you discuss any questions you have with your health care provider. Document Revised: 12/17/2016 Document Reviewed: 03/02/2016 Elsevier Patient Education  2020 [employer]  

## 2019-05-14 NOTE — Transfer of Care (Signed)
Immediate Anesthesia Transfer of Care Note  Patient: Olivia Sims  Procedure(s) Performed: TOTAL LAPAROSCOPIC HYSTERECTOMY WITH SALPINGECTOMY, Pelvic Washings (N/A Abdomen) Excision RIGHT Para Tubal CYST (Right Abdomen) CYSTOSCOPY (N/A Urethra)  Patient Location: PACU  Anesthesia Type:General  Level of Consciousness: drowsy and patient cooperative  Airway & Oxygen Therapy: Patient Spontanous Breathing and Patient connected to face mask oxygen  Post-op Assessment: Report given to RN and Post -op Vital signs reviewed and stable  Post vital signs: Reviewed and stable  Last Vitals:  Vitals Value Taken Time  BP    Temp    Pulse 89 05/14/19 1052  Resp 15 05/14/19 1052  SpO2 100 % 05/14/19 1052  Vitals shown include unvalidated device data.  Last Pain:  Vitals:   05/14/19 0546  TempSrc: Oral         Complications: No apparent anesthesia complications

## 2019-05-14 NOTE — H&P (Signed)
GYNECOLOGY  VISIT   HPI: 49 y.o.   Single  Caucasian  female   [id number] with Patient's last menstrual period was 04/19/2019 (exact date).   here for surgery.  Patient is requesting hysterectomy and removal of the adnexal mass.  She has read the ACOG HO on hysterectomy.   Patient has an endometrial polyp on EMB done for menorrhagia with irregular menses.  On pelvic US, she has an ovarian cyst 5.7 cm in larges diameter abutting the right ovary.  She also has a tiny submucosal fibroid measuring 9 mm in the LUS. Her EMS measured 11 mm.   CA125 12.5.   States her bleeding is really heavy, and that she is staining through clothing.  She has a sharp pain on her side three to four times a day, and then it goes away.   Started a new job and will have new insurance, beginning in June, 2021. She works at [employer].  Declines future childbearing.   GYNECOLOGIC HISTORY: Patient's last menstrual period was 04/19/2019 (exact date). Contraception: NA Menopausal hormone therapy:  NA Last mammogram:   02-07-19 Diag.Bil. w/Rt.Br.US/Simple cyst Rt.Br.10 o'clock,Lt.Br.Neg/density B/BiRads2/screening 13yr Last pap smear:    07-31-10 Neg, 03/28/19 - WNL, neg HR HPV        OB History    Gravida  0   Para  0   Term  0   Preterm  0   AB  0   Living  0     SAB  0   TAB  0   Ectopic  0   Multiple  0   Live Births  0              Patient Active Problem List   Diagnosis Date Noted  . Elevated LDL cholesterol level   . Obesity (BMI 30-39.9) 01/08/2019  . Prediabetes 01/08/2019  . Nail deformity 01/08/2019  . Vitamin D deficiency 01/08/2019  . Exercise-induced asthma   . Rheumatoid arthritis (HCC)   . Kidney stones   . Depression     Past Medical History:  Diagnosis Date  . Complication of anesthesia    bladder " froze' years ago per pt   . Depression   . Elevated LDL cholesterol level   . Endometrial polyp 2021  . Exercise-induced asthma    pt denies at  4/20/visit   . History of kidney stones   . Kidney stones   . Pre-diabetes   . Prediabetes   . Rheumatoid arthritis (HCC)   . Right ovarian cyst 2021  . Sleep apnea    hx of had surgery to correct   . Urinary incontinence   . Vitamin D deficiency     Past Surgical History:  Procedure Laterality Date  . GALLBLADDER SURGERY    . KIDNEY STONE SURGERY    . PALATE / UVULA BIOPSY / EXCISION    . shoulder surgery     . TONSILLECTOMY      Current Facility-Administered Medications  Medication Dose Route Frequency Provider Last Rate Last Admin  . cefoTEtan (CEFOTAN) 2 g in sodium chloride 0.9 % 100 mL IVPB  2 g Intravenous On Call to OR Patton Salles, MD      . lactated ringers infusion   Intravenous Continuous Patton Salles, MD 125 mL/hr at 05/14/19 5009 New Bag at 05/14/19 3818  . scopolamine (TRANSDERM-SCOP) 1 MG/3DAYS 1.5 mg  1 patch Transdermal Q72H Eilene Ghazi, MD   1.5 mg  at 05/14/19 0720     ALLERGIES: Patient has no known allergies.  Family History  Problem Relation Age of Onset  . Diabetes Mother   . Hypothyroidism Mother   . Heart Problems Mother        pacemaker   . Cancer Mother        endometrial ca?  . Thyroid disease Mother   . Hypertension Father   . Diabetes Father   . Non-Hodgkin's lymphoma Paternal Grandmother     Social History   Socioeconomic History  . Marital status: Single    Spouse name: Not on file  . Number of children: 0  . Years of education: 73  . Highest education level: Not on file  Occupational History  . Not on file  Tobacco Use  . Smoking status: Former Smoker    Packs/day: 1.00    Years: 16.00    Pack years: 16.00    Quit date: 2002    Years since quitting: 19.3  . Smokeless tobacco: Never Used  Substance and Sexual Activity  . Alcohol use: Yes    Alcohol/week: 1.0 standard drinks    Types: 1 Glasses of wine per week    Comment: once a week  . Drug use: Never  . Sexual activity: Not Currently     Birth control/protection: Abstinence  Other Topics Concern  . Not on file  Social History Narrative  . Not on file   Social Determinants of Health   Financial Resource Strain:   . Difficulty of Paying Living Expenses:   Food Insecurity:   . Worried About Charity fundraiser in the Last Year:   . Arboriculturist in the Last Year:   Transportation Needs:   . Film/video editor (Medical):   Marland Kitchen Lack of Transportation (Non-Medical):   Physical Activity:   . Days of Exercise per Week:   . Minutes of Exercise per Session:   Stress:   . Feeling of Stress :   Social Connections:   . Frequency of Communication with Friends and Family:   . Frequency of Social Gatherings with Friends and Family:   . Attends Religious Services:   . Active Member of Clubs or Organizations:   . Attends Archivist Meetings:   Marland Kitchen Marital Status:   Intimate Partner Violence:   . Fear of Current or Ex-Partner:   . Emotionally Abused:   Marland Kitchen Physically Abused:   . Sexually Abused:     Review of Systems  See HPI.  PHYSICAL EXAMINATION:    BP (!) 153/85   Pulse (!) 102   Temp 98.2 F (36.8 C) (Oral)   Resp 18   Ht 5\' 3"  (1.6 m)   Wt 102.3 kg   LMP 04/19/2019 (Exact Date)   SpO2 97%   BMI 39.96 kg/m     General appearance: alert, cooperative and appears stated age Head: Normocephalic, without obvious abnormality, atraumatic Lungs: clear to auscultation bilaterally Heart: regular rate and rhythm Abdomen: soft, non-tender, no masses,  no organomegaly Extremities: extremities normal, atraumatic, no cyanosis or edema Skin: Skin color, texture, turgor normal. Boil of left mid abdomen.  Neurologic: Grossly normal  Pelvic: deferred.  ASSESSMENT  Menorrhagia with irregular menses.  Endometrial polyp. Small fibroid.  Right adnexal cyst. FH of uterine cancer in mother.   PLAN  Proceed with total laparoscopic hysterectomy with bilateral salpingectomy, right adnexal cyst removal, possible  bilateral oophorectomy, cystoscopy.   Risk, benefits, and alternatives reviewed.  Risks include  but are not limited to bleeding, infection, damage to surrounding organs, pneumonia, reaction to anesthesia, DVT, PE, death, need for reoperation, hernia formation, vaginal cuff dehiscence, neuropathy, and need to convert to a traditional laparotomy incision to complete the procedure.   Patient wishes to proceed. Questions invited and answered.   An After Visit Summary was printed and given to the patient.

## 2019-05-14 NOTE — Progress Notes (Signed)
Day of Surgery Procedure(s) (LRB): TOTAL LAPAROSCOPIC HYSTERECTOMY WITH SALPINGECTOMY, Pelvic Washings (N/A) Excision RIGHT Para Tubal CYST (Right) CYSTOSCOPY (N/A)  Subjective: Patient reports tolerating PO and no problems voiding.   Ambulating.  Ok with discharge to home this evening.  Her mother and sister will be at home with her.   Objective: I have reviewed patient's vital signs, intake and output and labs. Vitals:   05/14/19 1335 05/14/19 1720  BP: 122/82 118/72  Pulse: 88 89  Resp: 18 16  Temp: 97.7 F (36.5 C) 97.9 F (36.6 C)  SpO2: 96% 97%    I/O - 2200 cc/560 cc.  CBC    Component Value Date/Time   WBC 16.0 (H) 05/14/2019 1449   RBC 4.55 05/14/2019 1449   HGB 13.0 05/14/2019 1449   HGB 13.8 01/08/2019 0914   HCT 40.5 05/14/2019 1449   HCT 40.6 01/08/2019 0914   PLT 265 05/14/2019 1449   PLT 316 01/08/2019 0914   MCV 89.0 05/14/2019 1449   MCV 86 01/08/2019 0914   MCH 28.6 05/14/2019 1449   MCHC 32.1 05/14/2019 1449   RDW 14.6 05/14/2019 1449   RDW 13.0 01/08/2019 0914   LYMPHSABS 1.9 01/08/2019 0914   EOSABS 0.2 01/08/2019 0914   BASOSABS 0.1 01/08/2019 0914      General: alert and cooperative Resp: clear to auscultation bilaterally Cardio: regular rate and rhythm, S1, S2 normal, no murmur, click, rub or gallop GI: soft, non-tender; bowel sounds normal; no masses,  no organomegaly Extremities: extremities normal, atraumatic, no cyanosis or edema and PAS and Ted hose on.  DPs 1+ bilaterally.  Vaginal Bleeding: minimal  Incisions:  Left lower quadrant incision with mild induration and ecchymoses.  Right lower quadrant incision with mild ecchymoses.  All incisions intact.   Assessment: s/p Procedure(s) with comments: TOTAL LAPAROSCOPIC HYSTERECTOMY WITH SALPINGECTOMY, Pelvic Washings (N/A) Excision RIGHT Para Tubal CYST (Right) - right adnexal cyst removal CYSTOSCOPY (N/A): progressing well  Plan:  Discharge home  Surgical findings and  procedure reviewed with patient.  Rx for Percocet and Motrin.  Post op instructions reviewed in verbal and written form.  FU in 1 week.    LOS: 0 days    Melony Overly 05/14/2019, 6:15 PM

## 2019-05-14 NOTE — Anesthesia Procedure Notes (Signed)
Procedure Name: Intubation Date/Time: 05/14/2019 8:01 AM Performed by: Marny Lowenstein, CRNA Pre-anesthesia Checklist: Patient identified, Emergency Drugs available, Suction available and Patient being monitored Patient Re-evaluated:Patient Re-evaluated prior to induction Oxygen Delivery Method: Circle system utilized Preoxygenation: Pre-oxygenation with 100% oxygen Induction Type: IV induction Ventilation: Mask ventilation without difficulty Laryngoscope Size: Miller and 2 Grade View: Grade III Tube type: Oral Tube size: 7.0 mm Number of attempts: 1 Airway Equipment and Method: Patient positioned with wedge pillow and Bougie stylet Placement Confirmation: positive ETCO2 and breath sounds checked- equal and bilateral Secured at: 21 cm Tube secured with: Tape Dental Injury: Teeth and Oropharynx as per pre-operative assessment  Difficulty Due To: Difficult Airway- due to anterior larynx, Difficult Airway- due to reduced neck mobility and Difficulty was anticipated

## 2019-05-14 NOTE — Anesthesia Postprocedure Evaluation (Signed)
Anesthesia Post Note  Patient: Olivia Sims  Procedure(s) Performed: TOTAL LAPAROSCOPIC HYSTERECTOMY WITH SALPINGECTOMY, Pelvic Washings (N/A Abdomen) Excision RIGHT Para Tubal CYST (Right Abdomen) CYSTOSCOPY (N/A Urethra)     Patient location during evaluation: PACU Anesthesia Type: General Level of consciousness: awake and alert Pain management: pain level controlled Vital Signs Assessment: post-procedure vital signs reviewed and stable Respiratory status: spontaneous breathing, nonlabored ventilation, respiratory function stable and patient connected to nasal cannula oxygen Cardiovascular status: blood pressure returned to baseline and stable Postop Assessment: no apparent nausea or vomiting Anesthetic complications: no    Last Vitals:  Vitals:   05/14/19 1200 05/14/19 1205  BP: 138/83 121/82  Pulse: 81 94  Resp: (!) 9 16  Temp:  36.4 C  SpO2: 94% 97%    Last Pain:  Vitals:   05/14/19 1205  TempSrc:   PainSc: 3                  Martine Trageser S

## 2019-05-16 LAB — SURGICAL PATHOLOGY

## 2019-05-16 LAB — CYTOLOGY - NON PAP

## 2019-05-18 ENCOUNTER — Other Ambulatory Visit: Payer: Self-pay

## 2019-05-21 ENCOUNTER — Ambulatory Visit (INDEPENDENT_AMBULATORY_CARE_PROVIDER_SITE_OTHER): Payer: BC Managed Care – PPO | Admitting: Obstetrics and Gynecology

## 2019-05-21 ENCOUNTER — Other Ambulatory Visit: Payer: Self-pay

## 2019-05-21 ENCOUNTER — Encounter: Payer: Self-pay | Admitting: Obstetrics and Gynecology

## 2019-05-21 VITALS — BP 118/72 | HR 72 | Temp 97.2°F | Ht 63.0 in | Wt 222.0 lb

## 2019-05-21 DIAGNOSIS — Z9889 Other specified postprocedural states: Secondary | ICD-10-CM

## 2019-05-21 DIAGNOSIS — L989 Disorder of the skin and subcutaneous tissue, unspecified: Secondary | ICD-10-CM | POA: Diagnosis not present

## 2019-05-21 NOTE — Progress Notes (Signed)
GYNECOLOGY  VISIT   HPI: 49 y.o.   Single  Caucasian  female   G0P0000 with Patient's last menstrual period was 04/19/2019.   here for 1 week post op    TOTAL LAPAROSCOPIC HYSTERECTOMY WITH SALPINGECTOMY, Pelvic Washings (N/A Abdomen) Excision RIGHT Para Tubal CYST (Right Abdomen) CYSTOSCOPY (N/A Urethra)  Final pathology - benign endometrial polyp, fibroid and benign paratubal cyst.  Feels bloated.  Having cramping in her back.  Not taken Percocet in a couple of days.  Having BMs ok.  Emptying bladder well.  Vaginal bleeding stopped 2 days after surgery.   She has chronic nonhealing sore of her abdominal skin.   GYNECOLOGIC HISTORY: Patient's last menstrual period was 04/19/2019. Contraception:  Hysterectomy Menopausal hormone therapy:  none Last mammogram:  02-07-19 Diag.Bil. w/Rt.Br.US/Simple cyst Rt.Br.10 o'clock,Lt.Br.Neg/density B/BiRads2/screening 57yr Last pap smear:   07-31-10 Neg, 03/28/19 - WNL, neg HR HPV        OB History    Gravida  0   Para  0   Term  0   Preterm  0   AB  0   Living  0     SAB  0   TAB  0   Ectopic  0   Multiple  0   Live Births  0              Patient Active Problem List   Diagnosis Date Noted  . Status post laparoscopic hysterectomy 05/14/2019  . Elevated LDL cholesterol level   . Obesity (BMI 30-39.9) 01/08/2019  . Prediabetes 01/08/2019  . Nail deformity 01/08/2019  . Vitamin D deficiency 01/08/2019  . Exercise-induced asthma   . Rheumatoid arthritis (HCC)   . Kidney stones   . Depression     Past Medical History:  Diagnosis Date  . Complication of anesthesia    bladder " froze' years ago per pt   . Depression   . Elevated LDL cholesterol level   . Endometrial polyp 2021  . Exercise-induced asthma    pt denies at 4/20/visit   . History of kidney stones   . Kidney stones   . Pre-diabetes   . Prediabetes   . Rheumatoid arthritis (HCC)   . Right ovarian cyst 2021  . Sleep apnea    hx of had surgery to  correct   . Urinary incontinence   . Vitamin D deficiency     Past Surgical History:  Procedure Laterality Date  . CYSTOSCOPY N/A 05/14/2019   Procedure: CYSTOSCOPY;  Surgeon: Patton Salles, MD;  Location: [facility];  Service: Gynecology;  Laterality: N/A;  . GALLBLADDER SURGERY    . KIDNEY STONE SURGERY    . OVARIAN CYST REMOVAL Right 05/14/2019   Procedure: Excision RIGHT Para Tubal CYST;  Surgeon: Patton Salles, MD;  Location: [facility];  Service: Gynecology;  Laterality: Right;  right adnexal cyst removal  . PALATE / UVULA BIOPSY / EXCISION    . shoulder surgery     . TONSILLECTOMY    . TOTAL LAPAROSCOPIC HYSTERECTOMY WITH SALPINGECTOMY N/A 05/14/2019   Procedure: TOTAL LAPAROSCOPIC HYSTERECTOMY WITH SALPINGECTOMY, Pelvic Washings;  Surgeon: Patton Salles, MD;  Location: [facility];  Service: Gynecology;  Laterality: N/A;    Current Outpatient Medications  Medication Sig Dispense Refill  . albuterol (VENTOLIN HFA) 108 (90 Base) MCG/ACT inhaler Inhale 2 puffs into the lungs 4 (four) times daily as needed.    Marland Kitchen  buPROPion (WELLBUTRIN XL) 150 MG 24 hr tablet Take one tablet every morning. 90 tablet 1  . clobetasol cream (TEMOVATE) 0.05 % Apply 1 application topically 2 (two) times daily.    . DULoxetine (CYMBALTA) 30 MG capsule TAKE 3 CAPSULES BY MOUTH EVERY DAY 270 capsule 1  . fexofenadine (ALLEGRA) 180 MG tablet Take 180 mg by mouth daily.    . folic acid (FOLVITE) 1 MG tablet Take 1 mg by mouth daily.    Marland Kitchen ibuprofen (ADVIL) 800 MG tablet Take 1 tablet (800 mg total) by mouth every 8 (eight) hours as needed for mild pain or moderate pain. 30 tablet 0  . Methotrexate Sodium (METHOTREXATE, PF,) 50 MG/2ML injection     . oxyCODONE-acetaminophen (PERCOCET/ROXICET) 5-325 MG tablet Take 1-2 tablets by mouth every 4 (four) hours as needed for moderate pain. 30 tablet 0  . Vitamin D, Ergocalciferol, (DRISDOL)  1.25 MG (50000 UT) CAPS capsule Take 1 capsule (50,000 Units total) by mouth every 7 (seven) days. 8 capsule 0   No current facility-administered medications for this visit.     ALLERGIES: Patient has no known allergies.  Family History  Problem Relation Age of Onset  . Diabetes Mother   . Hypothyroidism Mother   . Heart Problems Mother        pacemaker   . Cancer Mother        endometrial ca?  . Thyroid disease Mother   . Hypertension Father   . Diabetes Father   . Non-Hodgkin's lymphoma Paternal Grandmother     Social History   Socioeconomic History  . Marital status: Single    Spouse name: Not on file  . Number of children: 0  . Years of education: 34  . Highest education level: Not on file  Occupational History  . Not on file  Tobacco Use  . Smoking status: Former Smoker    Packs/day: 1.00    Years: 16.00    Pack years: 16.00    Quit date: 2002    Years since quitting: 19.3  . Smokeless tobacco: Never Used  Substance and Sexual Activity  . Alcohol use: Yes    Alcohol/week: 1.0 standard drinks    Types: 1 Glasses of wine per week    Comment: once a week  . Drug use: Never  . Sexual activity: Not Currently    Birth control/protection: Abstinence  Other Topics Concern  . Not on file  Social History Narrative  . Not on file   Social Determinants of Health   Financial Resource Strain:   . Difficulty of Paying Living Expenses:   Food Insecurity:   . Worried About Programme researcher, broadcasting/film/video in the Last Year:   . Barista in the Last Year:   Transportation Needs:   . Freight forwarder (Medical):   Marland Kitchen Lack of Transportation (Non-Medical):   Physical Activity:   . Days of Exercise per Week:   . Minutes of Exercise per Session:   Stress:   . Feeling of Stress :   Social Connections:   . Frequency of Communication with Friends and Family:   . Frequency of Social Gatherings with Friends and Family:   . Attends Religious Services:   . Active Member of  Clubs or Organizations:   . Attends Banker Meetings:   Marland Kitchen Marital Status:   Intimate Partner Violence:   . Fear of Current or Ex-Partner:   . Emotionally Abused:   Marland Kitchen Physically Abused:   .  Sexually Abused:     Review of Systems  Constitutional: Negative.   HENT: Negative.   Eyes: Negative.   Respiratory: Negative.   Cardiovascular: Negative.   Gastrointestinal: Negative.   Endocrine: Negative.   Genitourinary: Negative.   Musculoskeletal: Negative.   Skin: Negative.   Allergic/Immunologic: Negative.   Neurological: Negative.   Hematological: Negative.   Psychiatric/Behavioral: Negative.     PHYSICAL EXAMINATION:    BP 118/72 (BP Location: Right Arm, Patient Position: Sitting, Cuff Size: Large)   Pulse 72   Temp (!) 97.2 F (36.2 C) (Temporal)   Ht 5\' 3"  (1.6 m)   Wt 222 lb (100.7 kg)   LMP 04/19/2019   BMI 39.33 kg/m     General appearance: alert, cooperative and appears stated age  Abdomen: soft, non-tender, no masses,  no organomegaly                   Incisions intact.                    Excoriated 3 mm nonhealing sore of the abdominal wall.    Chaperone was present for exam.  ASSESSMENT  Status post total laparoscopic hysterectomy with bilateral salpingectomy, excision of right paratubal cyst, collection of pelvic washings, and cystoscopy.  Doing well post op.  Nonhealing sore of the abdominal skin.  This is not an incision site and preceded her surgery.  PLAN  Surgical findings, procedure, and pathology report reviewed.  Resume MTX and Enbrel 2 weeks post op.  She needs a note for work indicating why she is out of work and when she can return to work.  May return to work on 06/26/19 and has lifting restrictions until 07/24/19. FU for 6 week post op visit.  Possible MRSA of the abdominal wall skin discussed.  Wound culture of the skin performed.  Possible Bactrim DS therapy anticipated.

## 2019-05-24 LAB — WOUND CULTURE: Organism ID, Bacteria: NONE SEEN

## 2019-05-25 ENCOUNTER — Telehealth: Payer: Self-pay

## 2019-05-25 MED ORDER — SULFAMETHOXAZOLE-TRIMETHOPRIM 800-160 MG PO TABS: 1.0000 | ORAL_TABLET | Freq: Two times a day (BID) | ORAL | 0 refills | Status: AC

## 2019-05-25 NOTE — Telephone Encounter (Signed)
Left message for pt to return call to triage RN. 

## 2019-05-25 NOTE — Telephone Encounter (Signed)
Spoke with pt. Pt given results per Dr Edward Jolly. Pt agreeable and verbalized understanding. Rx Bactrim DS # 14, 0RF sent to pharmacy on file. Pt advised to call if resolved. Pt agreeable. Pt has 6 wk post op appt on 06/25/19.  Routing to Dr Edward Jolly for review.  Encounter closed.

## 2019-05-25 NOTE — Telephone Encounter (Signed)
-----   Message from Patton Salles, MD sent at 05/24/2019  5:31 PM EDT ----- Please contact patient with results of her wound culture.  She does have staph infection of that small nonhealing area of her skin.  I am recommending she treat with Bactrim DS po bid x 7 days.  Please have her see me if this is not clearing up.  She also has a 6 week post op appointment with me.

## 2019-05-27 NOTE — Discharge Summary (Signed)
Physician Discharge Summary  Patient ID: Olivia Sims MRN: [id number] DOB/AGE: 50-Sep-1972 50 y.o.  Admit date: 05/14/2019 Discharge date:  05/14/19 Admission Diagnoses:   1.  Menorrhagia with irregular menses 2.  Right adnexal cyst  Discharge Diagnoses:  1.  Menorrhagia with irregular menses 2.  Right adnexal cyst with torsion 3.  Status post total laparoscopic hysterectomy with bilateral salpingectomy, excision of right adnexal cyst, collection of pelvic washings, cystoscopy  Active Problems:   Status post laparoscopic hysterectomy  Discharged Condition: good  Hospital Course:  The patient was admitted on 05/14/19 for a total laparoscopic hysterectomy with bilateral salpingectomy, excision of right adnexal cyst, collection of pelvic washings, cystoscopy which were performed without complication while under general anesthesia.  The patient's post op course was uneventful.  Her pain medication was converted to Percocet and Motrin on post op day zero when she began taking po well.  She ambulated independently and wore PAS and Ted hose for DVT prophylaxis while in bed.  She received Lovenox preop.  She voided adequately post op. The patient's vital signs remained stable and she demonstrated no signs of infection during her hospitalization.  The patient's post op day zero Hgb was 13.0, and her WBC was 16.0, the latter of which was attributed to tissue dissection during surgery.  Her adnexal cyst torsion may have also contributed to this.  She had very minimal vaginal bleeding, and her incisions demonstrated no signs of erythema or significant drainage.  She was found to be in good condition and ready for discharge on post op day zero.  Consults: None  Significant Diagnostic Studies: labs:  See Hospital Course  Treatments: surgery:  Total laparoscopic hysterectomy with bilateral salpingectomy, excision of right adnexal cyst, collection of pelvic washings, cystoscopy  Discharge Exam: Blood  pressure 118/72, pulse 89, temperature 97.9 F (36.6 C), resp. rate 16, height 5\' 3"  (1.6 m), weight 102.3 kg, last menstrual period 04/19/2019, SpO2 97 %.  General: alert and cooperative Resp: clear to auscultation bilaterally Cardio: regular rate and rhythm, S1, S2 normal, no murmur, click, rub or gallop GI: soft, non-tender; bowel sounds normal; no masses,  no organomegaly Extremities: extremities normal, atraumatic, no cyanosis or edema and PAS and Ted hose on.  DPs 1+ bilaterally.  Vaginal Bleeding: minimal  Incisions:  Left lower quadrant incision with mild induration and ecchymoses.  Right lower quadrant incision with mild ecchymoses.  All incisions intact.    Disposition: Discharge disposition: 01-Home or Self Care     Discharge instructions were reviewed in verbal and written from.   She will not take Methotrexate or Enbrel for 2 weeks post op.   Discharge Instructions    Discharge patient   Complete by: As directed    Discharge disposition: 01-Home or Self Care   Discharge patient date: 05/14/2019     Allergies as of 05/14/2019   No Known Allergies     Medication List    STOP taking these medications   Enbrel 50 MG/ML injection Generic drug: etanercept   methotrexate 50 MG/2ML injection     TAKE these medications   albuterol 108 (90 Base) MCG/ACT inhaler Commonly known as: VENTOLIN HFA Inhale 2 puffs into the lungs 4 (four) times daily as needed.   buPROPion 150 MG 24 hr tablet Commonly known as: Wellbutrin XL Take one tablet every morning.   DULoxetine 30 MG capsule Commonly known as: CYMBALTA TAKE 3 CAPSULES BY MOUTH EVERY DAY   fexofenadine 180 MG tablet Commonly known as: ALLEGRA  Take 180 mg by mouth daily.   folic acid 1 MG tablet Commonly known as: FOLVITE Take 1 mg by mouth daily.   ibuprofen 800 MG tablet Commonly known as: ADVIL Take 1 tablet (800 mg total) by mouth every 8 (eight) hours as needed for mild pain or moderate pain.    oxyCODONE-acetaminophen 5-325 MG tablet Commonly known as: PERCOCET/ROXICET Take 1-2 tablets by mouth every 4 (four) hours as needed for moderate pain.   Vitamin D (Ergocalciferol) 1.25 MG (50000 UNIT) Caps capsule Commonly known as: DRISDOL Take 1 capsule (50,000 Units total) by mouth every 7 (seven) days.      Follow-up Information    Nunzio Cobbs, MD In 1 week.   Specialty: Obstetrics and Gynecology Contact information: [address] [place] [address] [phone]           Signed: Arloa Koh 05/27/2019, 1:59 PM

## 2019-06-04 ENCOUNTER — Other Ambulatory Visit: Payer: Self-pay

## 2019-06-04 ENCOUNTER — Encounter: Payer: Self-pay | Admitting: Obstetrics and Gynecology

## 2019-06-04 ENCOUNTER — Telehealth: Payer: Self-pay

## 2019-06-04 ENCOUNTER — Ambulatory Visit (INDEPENDENT_AMBULATORY_CARE_PROVIDER_SITE_OTHER): Payer: BC Managed Care – PPO | Admitting: Obstetrics and Gynecology

## 2019-06-04 VITALS — BP 140/78 | Temp 97.2°F | Ht 62.0 in | Wt 223.0 lb

## 2019-06-04 DIAGNOSIS — Z90710 Acquired absence of both cervix and uterus: Secondary | ICD-10-CM

## 2019-06-04 DIAGNOSIS — N939 Abnormal uterine and vaginal bleeding, unspecified: Secondary | ICD-10-CM

## 2019-06-04 NOTE — Progress Notes (Signed)
GYNECOLOGY  VISIT   HPI: 49 y.o.   Single  Caucasian  female   G0P0000 with Patient's last menstrual period was 04/19/2019.   here for 2 weeks post TOTAL LAPAROSCOPIC HYSTERECTOMY WITH SALPINGECTOMY, Pelvic Washings (N/A Abdomen)  Excision RIGHT Para Tubal CYST (Right Abdomen)  CYSTOSCOPY (N/A Urethra).  Patient complaining of vaginal bleeding for 2 days--Bright red.  Patient has been experiencing some suprapubic pain.   Spotting 3 nights ago.  The next morning, she had active red bleeding.  She called the on call MD, and her bleeding stopped by that evening.  Yesterday, she had some discomfort and she had red bleeding.   She feels shaky and weak today.  No fever or chills.   Last BM was this am.   No diarrhea.   Voiding well.    Went out to [employer] in [place].  Went shopping. She states she is venturing out a little bit.   No tampon use.   GYNECOLOGIC HISTORY: Patient's last menstrual period was 04/19/2019. Contraception: Hyst Menopausal hormone therapy:  none Last mammogram: 02-07-19 Diag.Bil. w/Rt.Br.US/Simple cyst Rt.Br.10 o'clock,Lt.Br.Neg/density B/BiRads2/screening 85yr Last pap smear:03/28/19 - WNL, neg HR HPV, 07-31-10 Neg,        OB History    Gravida  0   Para  0   Term  0   Preterm  0   AB  0   Living  0     SAB  0   TAB  0   Ectopic  0   Multiple  0   Live Births  0              Patient Active Problem List   Diagnosis Date Noted  . Status post laparoscopic hysterectomy 05/14/2019  . Elevated LDL cholesterol level   . Obesity (BMI 30-39.9) 01/08/2019  . Prediabetes 01/08/2019  . Nail deformity 01/08/2019  . Vitamin D deficiency 01/08/2019  . Exercise-induced asthma   . Rheumatoid arthritis (HCC)   . Kidney stones   . Depression     Past Medical History:  Diagnosis Date  . Complication of anesthesia    bladder " froze' years ago per pt   . Depression   . Elevated LDL cholesterol level   . Endometrial polyp  2021  . Exercise-induced asthma    pt denies at 4/20/visit   . History of kidney stones   . Kidney stones   . Pre-diabetes   . Prediabetes   . Rheumatoid arthritis (HCC)   . Right ovarian cyst 2021  . Sleep apnea    hx of had surgery to correct   . Urinary incontinence   . Vitamin D deficiency     Past Surgical History:  Procedure Laterality Date  . CYSTOSCOPY N/A 05/14/2019   Procedure: CYSTOSCOPY;  Surgeon: Patton Salles, MD;  Location: [facility];  Service: Gynecology;  Laterality: N/A;  . GALLBLADDER SURGERY    . KIDNEY STONE SURGERY    . OVARIAN CYST REMOVAL Right 05/14/2019   Procedure: Excision RIGHT Para Tubal CYST;  Surgeon: Patton Salles, MD;  Location: [facility];  Service: Gynecology;  Laterality: Right;  right adnexal cyst removal  . PALATE / UVULA BIOPSY / EXCISION    . shoulder surgery     . TONSILLECTOMY    . TOTAL LAPAROSCOPIC HYSTERECTOMY WITH SALPINGECTOMY N/A 05/14/2019   Procedure: TOTAL LAPAROSCOPIC HYSTERECTOMY WITH SALPINGECTOMY, Pelvic Washings;  Surgeon: Ardell Isaacs, Forrestine Him,  MD;  Location: [facility];  Service: Gynecology;  Laterality: N/A;    Current Outpatient Medications  Medication Sig Dispense Refill  . albuterol (VENTOLIN HFA) 108 (90 Base) MCG/ACT inhaler Inhale 2 puffs into the lungs 4 (four) times daily as needed.    Marland Kitchen buPROPion (WELLBUTRIN XL) 150 MG 24 hr tablet Take one tablet every morning. 90 tablet 1  . clobetasol cream (TEMOVATE) 0.05 % Apply 1 application topically 2 (two) times daily.    . DULoxetine (CYMBALTA) 30 MG capsule TAKE 3 CAPSULES BY MOUTH EVERY DAY 270 capsule 1  . fexofenadine (ALLEGRA) 180 MG tablet Take 180 mg by mouth daily.    . folic acid (FOLVITE) 1 MG tablet Take 1 mg by mouth daily.    Marland Kitchen ibuprofen (ADVIL) 800 MG tablet Take 1 tablet (800 mg total) by mouth every 8 (eight) hours as needed for mild pain or moderate pain. 30 tablet 0  .  Methotrexate Sodium (METHOTREXATE, PF,) 50 MG/2ML injection     . Vitamin D, Ergocalciferol, (DRISDOL) 1.25 MG (50000 UT) CAPS capsule Take 1 capsule (50,000 Units total) by mouth every 7 (seven) days. 8 capsule 0   No current facility-administered medications for this visit.     ALLERGIES: Patient has no known allergies.  Family History  Problem Relation Age of Onset  . Diabetes Mother   . Hypothyroidism Mother   . Heart Problems Mother        pacemaker   . Cancer Mother        endometrial ca?  . Thyroid disease Mother   . Hypertension Father   . Diabetes Father   . Non-Hodgkin's lymphoma Paternal Grandmother     Social History   Socioeconomic History  . Marital status: Single    Spouse name: Not on file  . Number of children: 0  . Years of education: 63  . Highest education level: Not on file  Occupational History  . Not on file  Tobacco Use  . Smoking status: Former Smoker    Packs/day: 1.00    Years: 16.00    Pack years: 16.00    Quit date: 2002    Years since quitting: 19.3  . Smokeless tobacco: Never Used  Substance and Sexual Activity  . Alcohol use: Yes    Alcohol/week: 1.0 standard drinks    Types: 1 Glasses of wine per week    Comment: once a week  . Drug use: Never  . Sexual activity: Not Currently    Birth control/protection: Abstinence  Other Topics Concern  . Not on file  Social History Narrative  . Not on file   Social Determinants of Health   Financial Resource Strain:   . Difficulty of Paying Living Expenses:   Food Insecurity:   . Worried About Programme researcher, broadcasting/film/video in the Last Year:   . Barista in the Last Year:   Transportation Needs:   . Freight forwarder (Medical):   Marland Kitchen Lack of Transportation (Non-Medical):   Physical Activity:   . Days of Exercise per Week:   . Minutes of Exercise per Session:   Stress:   . Feeling of Stress :   Social Connections:   . Frequency of Communication with Friends and Family:   .  Frequency of Social Gatherings with Friends and Family:   . Attends Religious Services:   . Active Member of Clubs or Organizations:   . Attends Banker Meetings:   Marland Kitchen Marital  Status:   Intimate Partner Violence:   . Fear of Current or Ex-Partner:   . Emotionally Abused:   Marland Kitchen Physically Abused:   . Sexually Abused:     Review of Systems  All other systems reviewed and are negative.   PHYSICAL EXAMINATION:    BP 140/78 (Cuff Size: Large)   Temp (!) 97.2 F (36.2 C) (Temporal)   Ht 5\' 2"  (1.575 m)   Wt 223 lb (101.2 kg)   LMP 04/19/2019   BMI 40.79 kg/m     General appearance: alert, cooperative and appears stated age   Abdomen: incisions intact.  Abdomen is soft, non-tender, no masses,  no organomegaly   Pelvic: External genitalia:  no lesions              Urethra:  normal appearing urethra with no masses, tenderness or lesions              Bartholins and Skenes: normal                 Vagina: normal appearing vagina with normal color and discharge, no lesions              Cervix: absent.  Small dark clot adherent to the vaginal cuff.                 Bimanual Exam:  Uterus:  absent              Adnexa: no mass, fullness, tenderness on left.  Some tenderness abdominally on the left.                Chaperone was present for exam.  ASSESSMENT  Vaginal bleeding status post laparoscopic hysterectomy.  No evidence of cuff cellulitis.  I don't think vaginal cuff hematoma is likely.   PLAN  We discussed vaginal cuff bleeding post op.  No abx at this time.  CBC with differential.  Return for a recheck this week.  I would like to do a pelvic US then.  Call for fever, increasing pain or increasing bleeding.     An After Visit Summary was printed and given to the patient.

## 2019-06-04 NOTE — Progress Notes (Signed)
Patient scheduled while in office for PUS on 06/07/19 at 4pm, consult to follow at 4:30pm with Dr. Edward Jolly. Patient is agreeable to date and time.

## 2019-06-04 NOTE — Telephone Encounter (Signed)
Patient called in regards to heavy bleeding. Patient had a hysterectomy on (05/14/19) . Patient stated she had started heavy bleeding on Saturday morning and has lower abdomen pain that started last week. Patient would like to discuss this problem.

## 2019-06-04 NOTE — Telephone Encounter (Signed)
AEX 01/2019 TOTAL LAPAROSCOPIC HYSTERECTOMY WITH SALPINGECTOMY, Pelvic Washings (N/A Abdomen) Excision RIGHT Para Tubal CYST (Right Abdomen) CYSTOSCOPY (N/A Urethra) on 05/14/19  Spoke with pt. Pt 3 weeks post op.  Pt states waking up Saturday morning with bright red bleeding that soaked through pjs, down leg and bed sheets x 1. Pt states called oncall MD to report and was told to monitor and rest. Pt states bleeding stopped Saturday evening. Pt then states vag bleeding that was bright red started back Sunday afternoon and has continued today. Pt states not heavy today, only changing pad/tampon every 3-4 hrs. Denies fever, chills, clots, dizziness, weakness. Pt states only has been walking with intermittent stops and denies any heavy lifting.  Pt reports intermittent lower abd pain/cramps that started a couple of days before Saturday bleeding episode.  Pt advised to be seen for further evaluation. Pt agreeable. Pt scheduled as work-in appt today at 3:45 pm with Dr Edward Jolly. Pt verbalized understanding. CPS Neg.   Routing to Dr Edward Jolly for review.  Encounter closed.

## 2019-06-05 LAB — CBC WITH DIFFERENTIAL/PLATELET
Basophils Absolute: 0.1 10*3/uL (ref 0.0–0.2)
Basos: 1 %
EOS (ABSOLUTE): 0.3 10*3/uL (ref 0.0–0.4)
Eos: 3 %
Hematocrit: 42.8 % (ref 34.0–46.6)
Hemoglobin: 14.2 g/dL (ref 11.1–15.9)
Immature Grans (Abs): 0.1 10*3/uL (ref 0.0–0.1)
Immature Granulocytes: 1 %
Lymphocytes Absolute: 2.4 10*3/uL (ref 0.7–3.1)
Lymphs: 21 %
MCH: 28.4 pg (ref 26.6–33.0)
MCHC: 33.2 g/dL (ref 31.5–35.7)
MCV: 86 fL (ref 79–97)
Monocytes Absolute: 1.8 10*3/uL — ABNORMAL HIGH (ref 0.1–0.9)
Monocytes: 16 %
Neutrophils Absolute: 6.5 10*3/uL (ref 1.4–7.0)
Neutrophils: 58 %
Platelets: 315 10*3/uL (ref 150–450)
RBC: 5 x10E6/uL (ref 3.77–5.28)
RDW: 14.2 % (ref 11.7–15.4)
WBC: 11.3 10*3/uL — ABNORMAL HIGH (ref 3.4–10.8)

## 2019-06-06 ENCOUNTER — Other Ambulatory Visit: Payer: Self-pay

## 2019-06-07 ENCOUNTER — Ambulatory Visit (INDEPENDENT_AMBULATORY_CARE_PROVIDER_SITE_OTHER): Payer: Self-pay | Admitting: Obstetrics and Gynecology

## 2019-06-07 ENCOUNTER — Ambulatory Visit (INDEPENDENT_AMBULATORY_CARE_PROVIDER_SITE_OTHER): Payer: Self-pay

## 2019-06-07 ENCOUNTER — Encounter: Payer: Self-pay | Admitting: Obstetrics and Gynecology

## 2019-06-07 VITALS — BP 122/82 | HR 88 | Temp 98.0°F | Ht 62.0 in | Wt 221.6 lb

## 2019-06-07 DIAGNOSIS — Z9889 Other specified postprocedural states: Secondary | ICD-10-CM

## 2019-06-07 DIAGNOSIS — Z90710 Acquired absence of both cervix and uterus: Secondary | ICD-10-CM

## 2019-06-07 DIAGNOSIS — N939 Abnormal uterine and vaginal bleeding, unspecified: Secondary | ICD-10-CM

## 2019-06-07 NOTE — Progress Notes (Signed)
GYNECOLOGY  VISIT   HPI: 49 y.o.   Single  Caucasian  female   G0P0000 with Patient's last menstrual period was 04/19/2019.   here for pelvic ultrasound for post laparoscopic hysterectomy vaginal bleeding.  Bleeding is less now.  When she was seen in the office on 06/04/19, she had a small clot at her vaginal cuff but no active bleeding.  There were no signs of cellulitis or abscess then.   No fever or feeling poorly.   She has RA and told her levels of WBC can be increased. WBC 11.3 with no left shift on 06/04/19.  GYNECOLOGIC HISTORY: Patient's last menstrual period was 04/19/2019. Contraception: Hyst Menopausal hormone therapy:  none Last mammogram: 02-07-19 Diag.Bil. w/Rt.Br.US/Simple cyst Rt.Br.10  Last pap smear:  3/1-/21- WNL, neg HR HPV        OB History    Gravida  0   Para  0   Term  0   Preterm  0   AB  0   Living  0     SAB  0   TAB  0   Ectopic  0   Multiple  0   Live Births  0              Patient Active Problem List   Diagnosis Date Noted  . Status post laparoscopic hysterectomy 05/14/2019  . Elevated LDL cholesterol level   . Obesity (BMI 30-39.9) 01/08/2019  . Prediabetes 01/08/2019  . Nail deformity 01/08/2019  . Vitamin D deficiency 01/08/2019  . Exercise-induced asthma   . Rheumatoid arthritis (HCC)   . Kidney stones   . Depression     Past Medical History:  Diagnosis Date  . Complication of anesthesia    bladder " froze' years ago per pt   . Depression   . Elevated LDL cholesterol level   . Endometrial polyp 2021  . Exercise-induced asthma    pt denies at 4/20/visit   . History of kidney stones   . Kidney stones   . Pre-diabetes   . Prediabetes   . Rheumatoid arthritis (HCC)   . Right ovarian cyst 2021  . Sleep apnea    hx of had surgery to correct   . Urinary incontinence   . Vitamin D deficiency     Past Surgical History:  Procedure Laterality Date  . CYSTOSCOPY N/A 05/14/2019   Procedure: CYSTOSCOPY;   Surgeon: Patton Salles, MD;  Location: [facility];  Service: Gynecology;  Laterality: N/A;  . GALLBLADDER SURGERY    . KIDNEY STONE SURGERY    . OVARIAN CYST REMOVAL Right 05/14/2019   Procedure: Excision RIGHT Para Tubal CYST;  Surgeon: Patton Salles, MD;  Location: [facility];  Service: Gynecology;  Laterality: Right;  right adnexal cyst removal  . PALATE / UVULA BIOPSY / EXCISION    . shoulder surgery     . TONSILLECTOMY    . TOTAL LAPAROSCOPIC HYSTERECTOMY WITH SALPINGECTOMY N/A 05/14/2019   Procedure: TOTAL LAPAROSCOPIC HYSTERECTOMY WITH SALPINGECTOMY, Pelvic Washings;  Surgeon: Patton Salles, MD;  Location: [facility];  Service: Gynecology;  Laterality: N/A;    Current Outpatient Medications  Medication Sig Dispense Refill  . albuterol (VENTOLIN HFA) 108 (90 Base) MCG/ACT inhaler Inhale 2 puffs into the lungs 4 (four) times daily as needed.    Marland Kitchen buPROPion (WELLBUTRIN XL) 150 MG 24 hr tablet Take one tablet every morning. 90 tablet 1  .  clobetasol cream (TEMOVATE) 0.05 % Apply 1 application topically 2 (two) times daily.    . DULoxetine (CYMBALTA) 30 MG capsule TAKE 3 CAPSULES BY MOUTH EVERY DAY 270 capsule 1  . fexofenadine (ALLEGRA) 180 MG tablet Take 180 mg by mouth daily.    . folic acid (FOLVITE) 1 MG tablet Take 1 mg by mouth daily.    Marland Kitchen ibuprofen (ADVIL) 800 MG tablet Take 1 tablet (800 mg total) by mouth every 8 (eight) hours as needed for mild pain or moderate pain. 30 tablet 0  . Methotrexate Sodium (METHOTREXATE, PF,) 50 MG/2ML injection     . Vitamin D, Ergocalciferol, (DRISDOL) 1.25 MG (50000 UT) CAPS capsule Take 1 capsule (50,000 Units total) by mouth every 7 (seven) days. 8 capsule 0   No current facility-administered medications for this visit.     ALLERGIES: Patient has no known allergies.  Family History  Problem Relation Age of Onset  . Diabetes Mother   . Hypothyroidism Mother     . Heart Problems Mother        pacemaker   . Cancer Mother        endometrial ca?  . Thyroid disease Mother   . Hypertension Father   . Diabetes Father   . Non-Hodgkin's lymphoma Paternal Grandmother     Social History   Socioeconomic History  . Marital status: Single    Spouse name: Not on file  . Number of children: 0  . Years of education: 72  . Highest education level: Not on file  Occupational History  . Not on file  Tobacco Use  . Smoking status: Former Smoker    Packs/day: 1.00    Years: 16.00    Pack years: 16.00    Quit date: 2002    Years since quitting: 19.3  . Smokeless tobacco: Never Used  Substance and Sexual Activity  . Alcohol use: Yes    Alcohol/week: 1.0 standard drinks    Types: 1 Glasses of wine per week    Comment: once a week  . Drug use: Never  . Sexual activity: Not Currently    Birth control/protection: Abstinence  Other Topics Concern  . Not on file  Social History Narrative  . Not on file   Social Determinants of Health   Financial Resource Strain:   . Difficulty of Paying Living Expenses:   Food Insecurity:   . Worried About Programme researcher, broadcasting/film/video in the Last Year:   . Barista in the Last Year:   Transportation Needs:   . Freight forwarder (Medical):   Marland Kitchen Lack of Transportation (Non-Medical):   Physical Activity:   . Days of Exercise per Week:   . Minutes of Exercise per Session:   Stress:   . Feeling of Stress :   Social Connections:   . Frequency of Communication with Friends and Family:   . Frequency of Social Gatherings with Friends and Family:   . Attends Religious Services:   . Active Member of Clubs or Organizations:   . Attends Banker Meetings:   Marland Kitchen Marital Status:   Intimate Partner Violence:   . Fear of Current or Ex-Partner:   . Emotionally Abused:   Marland Kitchen Physically Abused:   . Sexually Abused:     Review of Systems  All other systems reviewed and are negative.   PHYSICAL EXAMINATION:     BP 122/82 (Cuff Size: Large)   Pulse 88   Temp 98 F (36.7 C) (  Temporal)   Ht 5\' 2"  (1.575 m)   Wt 221 lb 9.6 oz (100.5 kg)   LMP 04/19/2019   BMI 40.53 kg/m     General appearance: alert, cooperative and appears stated age  Pelvic: External genitalia:  no lesions              Urethra:  normal appearing urethra with no masses, tenderness or lesions              Bartholins and Skenes: normal                 Vagina: normal appearing vagina with normal color and discharge, no lesions              Cervix: absent.  No bleeding noted.  Sutures intact.                 Bimanual Exam:  Uterus:  Absent.              Adnexa: no mass, fullness, tenderness  Chaperone was present for exam.  Pelvic US Vaginal cuff with 15 x 10 mm avascular mass at the cuff consistent with clot.  Left ovary with hemorrhagic cyst 20 x 20 mm.  Right ovary no mass.  No adnexal masses.  No free fluid.  ASSESSMENT  Vaginal bleeding post op likely from the vaginal cuff.  No evidence of infection.   PLAN  Continue reduced activity.  No abx needed. Keep 6 week post op check. Call for continued vaginal bleeding, fever, or feeling poorly.    An After Visit Summary was printed and given to the patient.

## 2019-06-13 ENCOUNTER — Encounter: Payer: Self-pay | Admitting: Family Medicine

## 2019-06-25 ENCOUNTER — Other Ambulatory Visit: Payer: Self-pay

## 2019-06-25 ENCOUNTER — Ambulatory Visit (INDEPENDENT_AMBULATORY_CARE_PROVIDER_SITE_OTHER): Payer: BLUE CROSS/BLUE SHIELD | Admitting: Obstetrics and Gynecology

## 2019-06-25 ENCOUNTER — Encounter: Payer: Self-pay | Admitting: Obstetrics and Gynecology

## 2019-06-25 VITALS — BP 128/82 | HR 80 | Temp 97.2°F | Ht 62.0 in | Wt 221.0 lb

## 2019-06-25 DIAGNOSIS — Z9889 Other specified postprocedural states: Secondary | ICD-10-CM

## 2019-06-25 NOTE — Progress Notes (Signed)
GYNECOLOGY  VISIT   HPI: 49 y.o.   Single  Caucasian  female   G0P0000 with Patient's last menstrual period was 04/19/2019.   here for 6 week follow up TOTAL LAPAROSCOPIC HYSTERECTOMY WITH SALPINGECTOMY, Pelvic Washings (N/A Abdomen)  Excision RIGHT Para Tubal CYST (Right Abdomen)  CYSTOSCOPY (N/A Urethra).  Bleeding has stopped following the weekend after her visit on 06/07/19. She can have some lower suprapubic pain, which is random.  Not needing medication for this.  Normal bladder and bowel function.   Supposed to return to work on 07/25/19 at which time she does not have lifting restrictions.   Just had her second Covid vaccine.  Had body aches, fever, and cough.  The next day she felt well.   Going to beach.   GYNECOLOGIC HISTORY: Patient's last menstrual period was 04/19/2019. Contraception:  Hyst Menopausal hormone therapy:  n/a Last mammogram:  02-07-19 Diag.Bil. w/Rt.Br.US/Simple cyst Rt.Br.10 o'clock,Lt.Br.Neg/density B/BiRads2/screening 84yr Last pap smear:  07-31-10 Neg, 03/28/19 - WNL, neg HR HPV        OB History    Gravida  0   Para  0   Term  0   Preterm  0   AB  0   Living  0     SAB  0   TAB  0   Ectopic  0   Multiple  0   Live Births  0              Patient Active Problem List   Diagnosis Date Noted  . Status post laparoscopic hysterectomy 05/14/2019  . Elevated LDL cholesterol level   . Obesity (BMI 30-39.9) 01/08/2019  . Prediabetes 01/08/2019  . Nail deformity 01/08/2019  . Vitamin D deficiency 01/08/2019  . Exercise-induced asthma   . Rheumatoid arthritis (HCC)   . Kidney stones   . Depression     Past Medical History:  Diagnosis Date  . Complication of anesthesia    bladder " froze' years ago per pt   . Depression   . Elevated LDL cholesterol level   . Endometrial polyp 2021  . Exercise-induced asthma    pt denies at 4/20/visit   . History of kidney stones   . Kidney stones   . Pre-diabetes   . Prediabetes   .  Rheumatoid arthritis (HCC)   . Right ovarian cyst 2021  . Sleep apnea    hx of had surgery to correct   . Urinary incontinence   . Vitamin D deficiency     Past Surgical History:  Procedure Laterality Date  . CYSTOSCOPY N/A 05/14/2019   Procedure: CYSTOSCOPY;  Surgeon: Patton Salles, MD;  Location: [facility];  Service: Gynecology;  Laterality: N/A;  . GALLBLADDER SURGERY    . KIDNEY STONE SURGERY    . OVARIAN CYST REMOVAL Right 05/14/2019   Procedure: Excision RIGHT Para Tubal CYST;  Surgeon: Patton Salles, MD;  Location: [facility];  Service: Gynecology;  Laterality: Right;  right adnexal cyst removal  . PALATE / UVULA BIOPSY / EXCISION    . shoulder surgery     . TONSILLECTOMY    . TOTAL LAPAROSCOPIC HYSTERECTOMY WITH SALPINGECTOMY N/A 05/14/2019   Procedure: TOTAL LAPAROSCOPIC HYSTERECTOMY WITH SALPINGECTOMY, Pelvic Washings;  Surgeon: Patton Salles, MD;  Location: [facility];  Service: Gynecology;  Laterality: N/A;    Current Outpatient Medications  Medication Sig Dispense Refill  . albuterol (VENTOLIN HFA) 108 (  90 Base) MCG/ACT inhaler Inhale 2 puffs into the lungs 4 (four) times daily as needed.    Marland Kitchen buPROPion (WELLBUTRIN XL) 150 MG 24 hr tablet Take one tablet every morning. 90 tablet 1  . clobetasol cream (TEMOVATE) 6.26 % Apply 1 application topically 2 (two) times daily.    . DULoxetine (CYMBALTA) 30 MG capsule TAKE 3 CAPSULES BY MOUTH EVERY DAY 270 capsule 1  . fexofenadine (ALLEGRA) 180 MG tablet Take 180 mg by mouth daily.    . folic acid (FOLVITE) 1 MG tablet Take 1 mg by mouth daily.    Marland Kitchen ibuprofen (ADVIL) 800 MG tablet Take 1 tablet (800 mg total) by mouth every 8 (eight) hours as needed for mild pain or moderate pain. 30 tablet 0  . Methotrexate Sodium (METHOTREXATE, PF,) 50 MG/2ML injection     . Vitamin D, Ergocalciferol, (DRISDOL) 1.25 MG (50000 UT) CAPS capsule Take 1 capsule  (50,000 Units total) by mouth every 7 (seven) days. 8 capsule 0   No current facility-administered medications for this visit.     ALLERGIES: Patient has no known allergies.  Family History  Problem Relation Age of Onset  . Diabetes Mother   . Hypothyroidism Mother   . Heart Problems Mother        pacemaker   . Cancer Mother        endometrial ca?  . Thyroid disease Mother   . Hypertension Father   . Diabetes Father   . Non-Hodgkin's lymphoma Paternal Grandmother     Social History   Socioeconomic History  . Marital status: Single    Spouse name: Not on file  . Number of children: 0  . Years of education: 53  . Highest education level: Not on file  Occupational History  . Not on file  Tobacco Use  . Smoking status: Former Smoker    Packs/day: 1.00    Years: 16.00    Pack years: 16.00    Quit date: 2002    Years since quitting: 19.4  . Smokeless tobacco: Never Used  Substance and Sexual Activity  . Alcohol use: Yes    Alcohol/week: 1.0 standard drinks    Types: 1 Glasses of wine per week    Comment: once a week  . Drug use: Never  . Sexual activity: Not Currently    Birth control/protection: Abstinence  Other Topics Concern  . Not on file  Social History Narrative  . Not on file   Social Determinants of Health   Financial Resource Strain:   . Difficulty of Paying Living Expenses:   Food Insecurity:   . Worried About Charity fundraiser in the Last Year:   . Arboriculturist in the Last Year:   Transportation Needs:   . Film/video editor (Medical):   Marland Kitchen Lack of Transportation (Non-Medical):   Physical Activity:   . Days of Exercise per Week:   . Minutes of Exercise per Session:   Stress:   . Feeling of Stress :   Social Connections:   . Frequency of Communication with Friends and Family:   . Frequency of Social Gatherings with Friends and Family:   . Attends Religious Services:   . Active Member of Clubs or Organizations:   . Attends Theatre manager Meetings:   Marland Kitchen Marital Status:   Intimate Partner Violence:   . Fear of Current or Ex-Partner:   . Emotionally Abused:   Marland Kitchen Physically Abused:   . Sexually Abused:  Review of Systems  All other systems reviewed and are negative.   PHYSICAL EXAMINATION:    BP 128/82 (Cuff Size: Large)   Pulse 80   Temp (!) 97.2 F (36.2 C) (Temporal)   Ht 5\' 2"  (1.575 m)   Wt 221 lb (100.2 kg)   LMP 04/19/2019   BMI 40.42 kg/m     General appearance: alert, cooperative and appears stated age   Abdomen: soft, non-tender, no masses,  no organomegaly                   Incisions well healed.  Has a small excoriated red area of left abdominal wall.  No fluctuation.     Pelvic: External genitalia:  no lesions              Urethra:  normal appearing urethra with no masses, tenderness or lesions              Bartholins and Skenes: normal                 Vagina: normal appearing vagina with normal color and discharge, no lesions              Cervix: absent                Bimanual Exam:  Uterus:  absent              Adnexa: no mass, fullness, tenderness         Chaperone was present for exam.  ASSESSMENT  Doing well following laparoscopic hysterectomy, bilateral salpingectomy, excision of right paratubal cyst.  Nonhealing area of the abdominal wall skin.  MRSA negative.  On Enbrel and MTX.  PLAN  Return for final post op visit in one month.  Will then have release back to work.  She will discuss the skin healing issue with Dr. 06/19/2019 office.    An After Visit Summary was printed and given to the patient.

## 2019-07-18 ENCOUNTER — Ambulatory Visit: Payer: BLUE CROSS/BLUE SHIELD | Admitting: Obstetrics and Gynecology

## 2019-07-18 ENCOUNTER — Encounter: Payer: Self-pay | Admitting: Obstetrics and Gynecology

## 2019-07-18 VITALS — BP 122/82 | HR 100 | Ht 62.0 in | Wt 220.6 lb

## 2019-07-18 DIAGNOSIS — Z9889 Other specified postprocedural states: Secondary | ICD-10-CM

## 2019-07-18 NOTE — Progress Notes (Signed)
GYNECOLOGY  VISIT   HPI: 49 y.o.   Single  Caucasian  female   G0P0000 with Patient's last menstrual period was 04/19/2019.   here for  8 weeks status post TOTAL LAPAROSCOPIC HYSTERECTOMY WITH SALPINGECTOMY, Pelvic Washings (N/A Abdomen) Excision RIGHT Para Tubal CYST (Right Abdomen) CYSTOSCOPY (N/A Urethra). Feels ready to return to regular activity.  Moved some furniture this weekend and had some pain in the bladder area, lower pelvis.  No vaginal bleeding.  No dysuria.  Normal BM function.  GYNECOLOGIC HISTORY: Patient's last menstrual period was 04/19/2019. Contraception:  Hyst Menopausal hormone therapy: n/a  Last mammogram:  02-07-19 Diag.Bil. w/Rt.Br.US/Simple cyst Rt.Br.10 o'clock,Lt.Br.Neg/density B/BiRads2/screening 74yr Last pap smear: 07-31-10 Neg, 03/28/19 - WNL, neg HR HPV        OB History    Gravida  0   Para  0   Term  0   Preterm  0   AB  0   Living  0     SAB  0   TAB  0   Ectopic  0   Multiple  0   Live Births  0              Patient Active Problem List   Diagnosis Date Noted  . Status post laparoscopic hysterectomy 05/14/2019  . Elevated LDL cholesterol level   . Obesity (BMI 30-39.9) 01/08/2019  . Prediabetes 01/08/2019  . Nail deformity 01/08/2019  . Vitamin D deficiency 01/08/2019  . Exercise-induced asthma   . Rheumatoid arthritis (HCC)   . Kidney stones   . Depression     Past Medical History:  Diagnosis Date  . Complication of anesthesia    bladder " froze' years ago per pt   . Depression   . Elevated LDL cholesterol level   . Endometrial polyp 2021  . Exercise-induced asthma    pt denies at 4/20/visit   . History of kidney stones   . Kidney stones   . Pre-diabetes   . Prediabetes   . Rheumatoid arthritis (HCC)   . Right ovarian cyst 2021  . Sleep apnea    hx of had surgery to correct   . Urinary incontinence   . Vitamin D deficiency     Past Surgical History:  Procedure Laterality Date  . CYSTOSCOPY N/A  05/14/2019   Procedure: CYSTOSCOPY;  Surgeon: Patton Salles, MD;  Location: [facility];  Service: Gynecology;  Laterality: N/A;  . GALLBLADDER SURGERY    . KIDNEY STONE SURGERY    . OVARIAN CYST REMOVAL Right 05/14/2019   Procedure: Excision RIGHT Para Tubal CYST;  Surgeon: Patton Salles, MD;  Location: [facility];  Service: Gynecology;  Laterality: Right;  right adnexal cyst removal  . PALATE / UVULA BIOPSY / EXCISION    . shoulder surgery     . TONSILLECTOMY    . TOTAL LAPAROSCOPIC HYSTERECTOMY WITH SALPINGECTOMY N/A 05/14/2019   Procedure: TOTAL LAPAROSCOPIC HYSTERECTOMY WITH SALPINGECTOMY, Pelvic Washings;  Surgeon: Patton Salles, MD;  Location: [facility];  Service: Gynecology;  Laterality: N/A;    Current Outpatient Medications  Medication Sig Dispense Refill  . albuterol (VENTOLIN HFA) 108 (90 Base) MCG/ACT inhaler Inhale 2 puffs into the lungs 4 (four) times daily as needed.    Marland Kitchen buPROPion (WELLBUTRIN XL) 150 MG 24 hr tablet Take one tablet every morning. 90 tablet 1  . clobetasol cream (TEMOVATE) 0.05 % Apply 1 application topically 2 (  two) times daily.    . DULoxetine (CYMBALTA) 30 MG capsule TAKE 3 CAPSULES BY MOUTH EVERY DAY 270 capsule 1  . ENBREL SURECLICK 50 MG/ML injection Inject into the skin once a week.    . fexofenadine (ALLEGRA) 180 MG tablet Take 180 mg by mouth daily.    . folic acid (FOLVITE) 1 MG tablet Take 1 mg by mouth daily.    Marland Kitchen ibuprofen (ADVIL) 800 MG tablet Take 1 tablet (800 mg total) by mouth every 8 (eight) hours as needed for mild pain or moderate pain. 30 tablet 0  . Methotrexate Sodium (METHOTREXATE, PF,) 50 MG/2ML injection     . Vitamin D, Ergocalciferol, (DRISDOL) 1.25 MG (50000 UT) CAPS capsule Take 1 capsule (50,000 Units total) by mouth every 7 (seven) days. 8 capsule 0   No current facility-administered medications for this visit.     ALLERGIES: Patient has no  known allergies.  Family History  Problem Relation Age of Onset  . Diabetes Mother   . Hypothyroidism Mother   . Heart Problems Mother        pacemaker   . Cancer Mother        endometrial ca?  . Thyroid disease Mother   . Hypertension Father   . Diabetes Father   . Non-Hodgkin's lymphoma Paternal Grandmother     Social History   Socioeconomic History  . Marital status: Single    Spouse name: Not on file  . Number of children: 0  . Years of education: 26  . Highest education level: Not on file  Occupational History  . Not on file  Tobacco Use  . Smoking status: Former Smoker    Packs/day: 1.00    Years: 16.00    Pack years: 16.00    Quit date: 2002    Years since quitting: 19.5  . Smokeless tobacco: Never Used  Vaping Use  . Vaping Use: Never used  Substance and Sexual Activity  . Alcohol use: Yes    Alcohol/week: 1.0 standard drink    Types: 1 Glasses of wine per week    Comment: once a week  . Drug use: Never  . Sexual activity: Not Currently    Birth control/protection: Abstinence  Other Topics Concern  . Not on file  Social History Narrative  . Not on file   Social Determinants of Health   Financial Resource Strain:   . Difficulty of Paying Living Expenses:   Food Insecurity:   . Worried About Programme researcher, broadcasting/film/video in the Last Year:   . Barista in the Last Year:   Transportation Needs:   . Freight forwarder (Medical):   Marland Kitchen Lack of Transportation (Non-Medical):   Physical Activity:   . Days of Exercise per Week:   . Minutes of Exercise per Session:   Stress:   . Feeling of Stress :   Social Connections:   . Frequency of Communication with Friends and Family:   . Frequency of Social Gatherings with Friends and Family:   . Attends Religious Services:   . Active Member of Clubs or Organizations:   . Attends Banker Meetings:   Marland Kitchen Marital Status:   Intimate Partner Violence:   . Fear of Current or Ex-Partner:   .  Emotionally Abused:   Marland Kitchen Physically Abused:   . Sexually Abused:     Review of Systems  All other systems reviewed and are negative.   PHYSICAL EXAMINATION:    BP 122/82 (  Cuff Size: Large)   Pulse 100   Ht 5\' 2"  (1.575 m)   Wt 220 lb 9.6 oz (100.1 kg)   LMP 04/19/2019   BMI 40.35 kg/m     General appearance: alert, cooperative and appears stated age  Pelvic: External genitalia:  no lesions              Urethra:  normal appearing urethra with no masses, tenderness or lesions              Bartholins and Skenes: normal                 Vagina: normal appearing vagina with normal color and discharge, no lesions              Cervix: absent                Bimanual Exam:  Uterus:  absent              Adnexa: no mass, fullness, tenderness  Chaperone was present for exam.  ASSESSMENT  Doing well post op.   PLAN  Ok to return to work and regular activity but nothing per vagina for 12 weeks post op.  Letter written for ok to return to work.  Follow up for annual exam and prn.

## 2019-08-08 ENCOUNTER — Ambulatory Visit (INDEPENDENT_AMBULATORY_CARE_PROVIDER_SITE_OTHER): Payer: BC Managed Care – PPO | Admitting: Adult Health

## 2019-08-08 ENCOUNTER — Encounter: Payer: Self-pay | Admitting: Adult Health

## 2019-08-08 ENCOUNTER — Other Ambulatory Visit: Payer: Self-pay

## 2019-08-08 DIAGNOSIS — F331 Major depressive disorder, recurrent, moderate: Secondary | ICD-10-CM

## 2019-08-08 DIAGNOSIS — G4700 Insomnia, unspecified: Secondary | ICD-10-CM | POA: Diagnosis not present

## 2019-08-08 DIAGNOSIS — R4689 Other symptoms and signs involving appearance and behavior: Secondary | ICD-10-CM

## 2019-08-08 DIAGNOSIS — F411 Generalized anxiety disorder: Secondary | ICD-10-CM

## 2019-08-08 DIAGNOSIS — R4189 Other symptoms and signs involving cognitive functions and awareness: Secondary | ICD-10-CM

## 2019-08-08 NOTE — Progress Notes (Signed)
PRYCE BUNGERT [id number] 1970-11-25 49 y.o.  Subjective:   Patient ID:  Olivia Sims is a 49 y.o. (DOB [birth year]) female.  Chief Complaint: No chief complaint on file.   HPI Olivia Sims presents to the office today for follow-up of MDD, GAD, insomnia, cognitive and behavioral issues, and fatigue.  Describes mood today as "ok". Pleasant. Denies tearfulness. Mood symptoms - denies depression, anxiety, and irritability. Stating "I'm doing alright". Out of work for past 10 weeks with hysterectomy. Increased "sweating". Has returned to work - Merchandiser, retail at [employer]. Recent tript to the beach. Working with therapist - sees weekly. Varying interest and motivation. Taking medications as prescribed.  Energy levels improving. Active, does not have a regular exercise routine.  Enjoys some usual interests and activities. Single. Lives with sister. Mother and father local.  Appetite adequate. Weight loss - 220 pounds. Sleeps well most nights. Averages 6 to 7 hours.  Focus and concentration stable. Stating "I feel more clear". Reading. Completing tasks. Managing some aspects of household. Denies SI or HI. Denies AH or VH.  Past medications: Wellbutrin, Abilify   PHQ2-9     Office Visit from 01/08/2019 in [facility] Office Visit from 12/28/2018 in [facility]  PHQ-2 Total Score 2 2  PHQ-9 Total Score 13 10       Review of Systems:  Review of Systems  Musculoskeletal: Negative for gait problem.  Neurological: Negative for tremors.  Psychiatric/Behavioral:       Please refer to HPI    Medications: I have reviewed the patient's current medications.  Current Outpatient Medications  Medication Sig Dispense Refill   albuterol (VENTOLIN HFA) 108 (90 Base) MCG/ACT inhaler Inhale 2 puffs into the lungs 4 (four) times daily as needed.     buPROPion (WELLBUTRIN XL) 150 MG 24 hr tablet Take one tablet every morning. 90 tablet 1   clobetasol cream (TEMOVATE) 0.05 % Apply 1  application topically 2 (two) times daily.     DULoxetine (CYMBALTA) 30 MG capsule TAKE 3 CAPSULES BY MOUTH EVERY DAY 270 capsule 1   ENBREL SURECLICK 50 MG/ML injection Inject into the skin once a week.     fexofenadine (ALLEGRA) 180 MG tablet Take 180 mg by mouth daily.     folic acid (FOLVITE) 1 MG tablet Take 1 mg by mouth daily.     ibuprofen (ADVIL) 800 MG tablet Take 1 tablet (800 mg total) by mouth every 8 (eight) hours as needed for mild pain or moderate pain. 30 tablet 0   Methotrexate Sodium (METHOTREXATE, PF,) 50 MG/2ML injection      Vitamin D, Ergocalciferol, (DRISDOL) 1.25 MG (50000 UT) CAPS capsule Take 1 capsule (50,000 Units total) by mouth every 7 (seven) days. 8 capsule 0   No current facility-administered medications for this visit.    Medication Side Effects: None  Allergies: No Known Allergies  Past Medical History:  Diagnosis Date   Complication of anesthesia    bladder " froze' years ago per pt    Depression    Elevated LDL cholesterol level    Endometrial polyp 2021   Exercise-induced asthma    pt denies at 4/20/visit    History of kidney stones    Kidney stones    Pre-diabetes    Prediabetes    Rheumatoid arthritis (HCC)    Right ovarian cyst 2021   Sleep apnea    hx of had surgery to correct    Urinary incontinence    Vitamin D  deficiency     Family History  Problem Relation Age of Onset   Diabetes Mother    Hypothyroidism Mother    Heart Problems Mother        pacemaker    Cancer Mother        endometrial ca?   Thyroid disease Mother    Hypertension Father    Diabetes Father    Non-Hodgkin's lymphoma Paternal Grandmother     Social History   Socioeconomic History   Marital status: Single    Spouse name: Not on file   Number of children: 0   Years of education: 12   Highest education level: Not on file  Occupational History   Not on file  Tobacco Use   Smoking status: Former Smoker     Packs/day: 1.00    Years: 16.00    Pack years: 16.00    Quit date: 2002    Years since quitting: 19.5   Smokeless tobacco: Never Used  Building services engineer Use: Never used  Substance and Sexual Activity   Alcohol use: Yes    Alcohol/week: 1.0 standard drink    Types: 1 Glasses of wine per week    Comment: once a week   Drug use: Never   Sexual activity: Not Currently    Birth control/protection: Abstinence  Other Topics Concern   Not on file  Social History Narrative   Not on file   Social Determinants of Health   Financial Resource Strain:    Difficulty of Paying Living Expenses:   Food Insecurity:    Worried About Programme researcher, broadcasting/film/video in the Last Year:    Barista in the Last Year:   Transportation Needs:    Freight forwarder (Medical):    Lack of Transportation (Non-Medical):   Physical Activity:    Days of Exercise per Week:    Minutes of Exercise per Session:   Stress:    Feeling of Stress :   Social Connections:    Frequency of Communication with Friends and Family:    Frequency of Social Gatherings with Friends and Family:    Attends Religious Services:    Active Member of Clubs or Organizations:    Attends Engineer, structural:    Marital Status:   Intimate Partner Violence:    Fear of Current or Ex-Partner:    Emotionally Abused:    Physically Abused:    Sexually Abused:     Past Medical History, Surgical history, Social history, and Family history were reviewed and updated as appropriate.   Please see review of systems for further details on the patient's review from today.   Objective:   Physical Exam:  LMP 04/19/2019   Physical Exam Constitutional:      General: She is not in acute distress. Musculoskeletal:        General: No deformity.  Neurological:     Mental Status: She is alert and oriented to person, place, and time.     Coordination: Coordination normal.  Psychiatric:        Attention  and Perception: Attention and perception normal. She does not perceive auditory or visual hallucinations.        Mood and Affect: Mood normal. Mood is not anxious or depressed. Affect is not labile, blunt, angry or inappropriate.        Speech: Speech normal.        Behavior: Behavior normal.        Thought  Content: Thought content normal. Thought content is not paranoid or delusional. Thought content does not include homicidal or suicidal ideation. Thought content does not include homicidal or suicidal plan.        Cognition and Memory: Cognition and memory normal.        Judgment: Judgment normal.     Comments: Insight intact     Lab Review:     Component Value Date/Time   NA 137 05/08/2019 1204   NA 136 01/08/2019 0914   K 4.4 05/08/2019 1204   CL 105 05/08/2019 1204   CO2 23 05/08/2019 1204   GLUCOSE 96 05/08/2019 1204   BUN 12 05/08/2019 1204   BUN 13 01/08/2019 0914   CREATININE 0.93 05/08/2019 1204   CALCIUM 8.5 (L) 05/08/2019 1204   PROT 7.0 01/08/2019 0914   ALBUMIN 4.2 01/08/2019 0914   AST 21 01/08/2019 0914   ALT 20 01/08/2019 0914   ALKPHOS 83 01/08/2019 0914   BILITOT 0.5 01/08/2019 0914   GFRNONAA >60 05/08/2019 1204   GFRAA >60 05/08/2019 1204       Component Value Date/Time   WBC 11.3 (H) 06/04/2019 1602   WBC 16.0 (H) 05/14/2019 1449   RBC 5.00 06/04/2019 1602   RBC 4.55 05/14/2019 1449   HGB 14.2 06/04/2019 1602   HCT 42.8 06/04/2019 1602   PLT 315 06/04/2019 1602   MCV 86 06/04/2019 1602   MCH 28.4 06/04/2019 1602   MCH 28.6 05/14/2019 1449   MCHC 33.2 06/04/2019 1602   MCHC 32.1 05/14/2019 1449   RDW 14.2 06/04/2019 1602   LYMPHSABS 2.4 06/04/2019 1602   EOSABS 0.3 06/04/2019 1602   BASOSABS 0.1 06/04/2019 1602    No results found for: POCLITH, LITHIUM   No results found for: PHENYTOIN, PHENOBARB, VALPROATE, CBMZ   .res Assessment: Plan:    Plan:  Wellbutrin XL 150mg  daily  Decrease Cymbalta 60mg  to 30mg  capsule  Working with  individual therapist.  RTC 3 months  Patient advised to contact office with any questions, adverse effects, or acute worsening in signs and symptoms.  Discussed potential metabolic side effects associated with atypical antipsychotics, as well as potential risk for movement side effects. Advised pt to contact office if movement side effects occur.   Diagnoses and all orders for this visit:  Major depressive disorder, recurrent episode, moderate (HCC)  Generalized anxiety disorder  Insomnia, unspecified type  Cognitive and behavioral changes     Please see After Visit Summary for patient specific instructions.  Future Appointments  Date Time Provider Department Center  01/09/2020  9:30 AM , NP-C [facility]    No orders of the defined types were placed in this encounter.   -------------------------------

## 2019-08-19 ENCOUNTER — Other Ambulatory Visit: Payer: Self-pay | Admitting: Adult Health

## 2019-08-19 DIAGNOSIS — F411 Generalized anxiety disorder: Secondary | ICD-10-CM

## 2019-08-19 DIAGNOSIS — R4189 Other symptoms and signs involving cognitive functions and awareness: Secondary | ICD-10-CM

## 2019-08-19 DIAGNOSIS — F331 Major depressive disorder, recurrent, moderate: Secondary | ICD-10-CM

## 2019-10-29 ENCOUNTER — Other Ambulatory Visit: Payer: Self-pay

## 2019-10-29 ENCOUNTER — Encounter: Payer: Self-pay | Admitting: Family Medicine

## 2019-10-29 ENCOUNTER — Telehealth (INDEPENDENT_AMBULATORY_CARE_PROVIDER_SITE_OTHER): Payer: BLUE CROSS/BLUE SHIELD | Admitting: Family Medicine

## 2019-10-29 VITALS — Wt 220.0 lb

## 2019-10-29 DIAGNOSIS — Z8616 Personal history of COVID-19: Secondary | ICD-10-CM

## 2019-10-29 DIAGNOSIS — J3489 Other specified disorders of nose and nasal sinuses: Secondary | ICD-10-CM

## 2019-10-29 DIAGNOSIS — R0981 Nasal congestion: Secondary | ICD-10-CM | POA: Diagnosis not present

## 2019-10-29 MED ORDER — AMOXICILLIN-POT CLAVULANATE 875-125 MG PO TABS: 1.0000 | ORAL_TABLET | Freq: Two times a day (BID) | ORAL | 0 refills | Status: DC

## 2019-10-29 NOTE — Progress Notes (Signed)
   Subjective:  Documentation for virtual audio and video telecommunications through Caregility encounter:  The patient was located at home. 2 patient identifiers used.  The provider was located in the office. The patient did consent to this visit and is aware of possible charges through their insurance for this visit.  The other persons participating in this telemedicine service were none. Time spent on call was 14 minutes and in review of previous records 20 minutes total.  This virtual service is not related to other E/M service within previous 7 days.   Patient ID: Olivia Sims, female    DOB: 1970-04-04, 49 y.o.   MRN: [id number]  HPI Chief Complaint  Patient presents with  . face swelling    nose swelling and congestion trouble breathing out of nose. started saturday   Complains of a 3 day history of rhinorrhea, nasal congestion, and intense pain on the right side of her nose. Right sided frontal headache. States her nasal bone on the right side is extremely TTP. She has pain with her glasses touching her nose.   Taking Ibuprofen 800 mg helps.  Covid vaccines received in May and June.  States she had a breakthrough case of Covid 5 weeks ago. States she felt flu-like while sick with Covid.   Denies fever, chills, dizziness, ear pain, sore throat, chest pain, palpitations, cough, shortness of breath, abdominal pain, N/V/D.     Review of Systems Pertinent positives and negatives in the history of present illness.     Objective:   Physical Exam Wt 220 lb (99.8 kg)   LMP 04/19/2019   BMI 40.24 kg/m  Alert and oriented and in no acute distress.  She does sound nasally congested.  TTP when she palpates her right maxillary sinus. respirations unlabored.  Speaking in complete sentences without difficulty.  Normal speech, mood and thought process.      Assessment & Plan:  Nasal congestion with rhinorrhea - Plan: amoxicillin-clavulanate (AUGMENTIN) 875-125 MG tablet  History  of COVID-19  Nasal pain - Plan: amoxicillin-clavulanate (AUGMENTIN) 875-125 MG tablet  Reports case of breakthrough Covid 5 weeks ago.  This is unlikely Covid related.  I will cover her with Augmentin due to the possibility of MRSA with cellulitis or sinusitis.  Continue with symptomatic treatment including ibuprofen 800 mg 3 times daily and I also recommend that she try decongestant and nondrowsy antihistamine combination.  Stay well-hydrated.  Follow-up if worsening or not back to baseline after completing the antibiotic

## 2019-10-31 ENCOUNTER — Other Ambulatory Visit: Payer: Self-pay | Admitting: Family Medicine

## 2019-10-31 MED ORDER — AZITHROMYCIN 250 MG PO TABS
ORAL_TABLET | ORAL | 0 refills | Status: DC
Start: 2019-10-31 — End: 2021-03-31

## 2019-11-08 ENCOUNTER — Ambulatory Visit: Payer: BLUE CROSS/BLUE SHIELD | Admitting: Adult Health

## 2020-01-09 ENCOUNTER — Encounter: Payer: BC Managed Care – PPO | Admitting: Family Medicine

## 2020-01-29 ENCOUNTER — Ambulatory Visit (INDEPENDENT_AMBULATORY_CARE_PROVIDER_SITE_OTHER): Payer: BLUE CROSS/BLUE SHIELD | Admitting: Family Medicine

## 2020-01-29 ENCOUNTER — Other Ambulatory Visit: Payer: Self-pay

## 2020-01-29 ENCOUNTER — Other Ambulatory Visit: Payer: BLUE CROSS/BLUE SHIELD

## 2020-01-29 ENCOUNTER — Encounter: Payer: Self-pay | Admitting: Family Medicine

## 2020-01-29 VITALS — BP 124/80 | HR 76 | Temp 97.7°F | Ht 64.0 in | Wt 207.0 lb

## 2020-01-29 DIAGNOSIS — Z0000 Encounter for general adult medical examination without abnormal findings: Secondary | ICD-10-CM | POA: Diagnosis not present

## 2020-01-29 DIAGNOSIS — R52 Pain, unspecified: Secondary | ICD-10-CM

## 2020-01-29 DIAGNOSIS — R0981 Nasal congestion: Secondary | ICD-10-CM | POA: Diagnosis not present

## 2020-01-29 DIAGNOSIS — J3489 Other specified disorders of nose and nasal sinuses: Secondary | ICD-10-CM

## 2020-01-29 DIAGNOSIS — R7303 Prediabetes: Secondary | ICD-10-CM | POA: Diagnosis not present

## 2020-01-29 DIAGNOSIS — R519 Headache, unspecified: Secondary | ICD-10-CM

## 2020-01-29 DIAGNOSIS — Z1159 Encounter for screening for other viral diseases: Secondary | ICD-10-CM

## 2020-01-29 DIAGNOSIS — E559 Vitamin D deficiency, unspecified: Secondary | ICD-10-CM | POA: Diagnosis not present

## 2020-01-29 DIAGNOSIS — E7800 Pure hypercholesterolemia, unspecified: Secondary | ICD-10-CM | POA: Diagnosis not present

## 2020-01-29 DIAGNOSIS — R531 Weakness: Secondary | ICD-10-CM | POA: Diagnosis not present

## 2020-01-29 DIAGNOSIS — Z1211 Encounter for screening for malignant neoplasm of colon: Secondary | ICD-10-CM | POA: Diagnosis not present

## 2020-01-29 DIAGNOSIS — E669 Obesity, unspecified: Secondary | ICD-10-CM | POA: Diagnosis not present

## 2020-01-29 DIAGNOSIS — M069 Rheumatoid arthritis, unspecified: Secondary | ICD-10-CM

## 2020-01-29 DIAGNOSIS — F32A Depression, unspecified: Secondary | ICD-10-CM

## 2020-01-29 LAB — POC INFLUENZA A&B (BINAX/QUICKVUE)
Influenza A, POC: NEGATIVE
Influenza B, POC: NEGATIVE

## 2020-01-29 LAB — POC COVID19 BINAXNOW: SARS Coronavirus 2 Ag: NEGATIVE

## 2020-01-29 NOTE — Progress Notes (Signed)
Subjective:    Patient ID: Olivia Sims, female    DOB: 03/29/1970, 50 y.o.   MRN: [id number]  HPI Chief Complaint  Patient presents with  . Annual Exam    CPE issues sleeping past few days she is feeling weak because of that   She is here for a complete physical exam.  Midpoint of our visit she reported feeling sick including nasal congestion, post nasal drainage, headache, myalgias and nausea x 5 days.  The front office was not informed of all symptoms so she was not flagged to reschedule her in office visit and instead be changed to a virtual visit with Covid testing.   Other providers: Psychiatrist- Dr. Yvette Rack  OB/GYN -Dr. Parks Ranger- Dr. Prince Rome Rheumatologist- Dr. Cloyd Stagers   Prediabetes- last Hgb A1c 5.8%   HL- LDL 118 in 12/2018  Vitamin D def - severely low at 7.7 and completed a course of prescription vitamin D. Is not currently taking a supplement.   Obesity- She has lost weight, 13 lbs per EMR. States she has ben working on it.   States she had Covid in September 2021.   Social history: Lives with her sister, has a female partner,  works at [employer] Denies smoking, drinking alcohol, drug use  Diet: fairly unhealthy  Excerise: nothing regular   Immunizations:  Health maintenance:  Mammogram: 02/07/2019 Colonoscopy: never  Last Gynecological Exam: recently  Last Menstrual cycle: hysterectomy  Last Dental Exam: 12/2019 Last Eye Exam: due next month   Wears seatbelt always, uses sunscreen, smoke detectors in home and functioning, does not text while driving and feels safe in home environment.   Reviewed allergies, medications, past medical, surgical, family, and social history.   Review of Systems Review of Systems Constitutional: -fever, -chills, -sweats, -unexpected weight change,-fatigue ENT: -runny nose, +nasal congestion, -ear pain, -sore throat Cardiology:  -chest pain, -palpitations, -edema Respiratory: -cough,  -shortness of breath, -wheezing Gastroenterology: -abdominal pain, +nausea, -vomiting, -diarrhea, -constipation  Hematology: -bleeding or bruising problems Musculoskeletal: +arthralgias, +myalgias, -joint swelling, -back pain Ophthalmology: -vision changes Urology: -dysuria, -difficulty urinating, -hematuria, -urinary frequency, -urgency Neurology: +headache, -weakness, -tingling, -numbness       Objective:   Physical Exam BP 124/80   Pulse 76   Temp 97.7 F (36.5 C)   Ht 5\' 4"  (1.626 m)   Wt 207 lb (93.9 kg)   LMP 04/19/2019   BMI 35.53 kg/m   General Appearance:    Alert, cooperative, no distress, appears stated age   Respirations unlabored.                               Assessment & Plan:  Routine general medical examination at a health care facility - Plan: CBC with Differential/Platelet, Comprehensive metabolic panel, TSH, T4, free, Lipid panel -preventive health care reviewed and will be updated. Counseling on healthy lifestyle. Recommend regular dental and eye exams. She sees an OB/GYN. Doing well since hysterectomy. Her mood is fine.  Immunizations reviewed but due to her not feeling well, we will not update today.  She will return for fasting labs when her PCR is back and she is feeling better.   Prediabetes - Plan: TSH, T4, free, Hemoglobin A1c -Recommend healthy diet and exercise.  Follow-up pending A1c result  Vitamin D deficiency - Plan: VITAMIN D 25 Hydroxy (Vit-D Deficiency, Fractures) -Check vitamin D level and adjust dose as appropriate  Obesity (BMI 30-39.9) -  Plan: TSH, T4, free -recommend healthy, low fat diet and increasing physical activity as tolerated   Elevated LDL cholesterol level - Plan: Lipid panel -Check fasting lipid panel.  Depression, unspecified depression type -Doing well.  She sees a psychiatrist for this.  Rheumatoid arthritis, involving unspecified site, unspecified whether rheumatoid factor present (HCC) -Managed by  rheumatologist.  Screen for colon cancer - Plan: Ambulatory referral to Gastroenterology -this will be her first screening colonoscopy   Need for hepatitis C screening test - Plan: Hepatitis C antibody -will be done per screening guidelines   Nasal congestion with rhinorrhea - Plan: POC COVID-19 BinaxNow, Novel Coronavirus, NAA ([facility]), POC Influenza A&B(BINAX/QUICKVUE) she will go to the parking lot and be tested for Covid and flu due to symptoms.   Generalized weakness - Plan: POC COVID-19 BinaxNow, Novel Coronavirus, NAA ([facility]), POC Influenza A&B(BINAX/QUICKVUE) she will go to the parking lot and be tested for Covid and flu due to symptoms.   Acute nonintractable headache, unspecified headache type - Plan: POC COVID-19 BinaxNow, Novel Coronavirus, NAA ([facility]), POC Influenza A&B(BINAX/QUICKVUE) she will go to the parking lot and be tested for Covid and flu due to symptoms.   Body aches - Plan: POC COVID-19 BinaxNow, Novel Coronavirus, NAA ([facility]), POC Influenza A&B(BINAX/QUICKVUE) -she will go to the parking lot and be tested for Covid and flu due to symptoms.

## 2020-01-29 NOTE — Progress Notes (Signed)
Please let her know that her rapid flu and COVID tests are negative.  We will need to wait and see what her PCR result shows.  She should continue with symptomatic treatment as we discussed and quarantine. Return for her fasting labs after PCR is back and her symptoms have mainly resolved.

## 2020-01-29 NOTE — Patient Instructions (Signed)
You can call and schedule your Dentist appointment at any of the following offices:   [facility] Address: [address]  [place], [place] [address] Phone #: [phone]  [facility] Address: [address] [place], [place] [address] Phone # [phone]  [facility]  Address: [address]                                                                  [place], [place] [address] Phone #: [phone]

## 2020-01-31 LAB — NOVEL CORONAVIRUS, NAA: SARS-CoV-2, NAA: NOT DETECTED

## 2020-01-31 LAB — SARS-COV-2, NAA 2 DAY TAT

## 2020-02-05 DIAGNOSIS — R21 Rash and other nonspecific skin eruption: Secondary | ICD-10-CM | POA: Diagnosis not present

## 2020-02-05 DIAGNOSIS — M0579 Rheumatoid arthritis with rheumatoid factor of multiple sites without organ or systems involvement: Secondary | ICD-10-CM | POA: Diagnosis not present

## 2020-02-05 DIAGNOSIS — M2550 Pain in unspecified joint: Secondary | ICD-10-CM | POA: Diagnosis not present

## 2020-02-05 DIAGNOSIS — Z79899 Other long term (current) drug therapy: Secondary | ICD-10-CM | POA: Diagnosis not present

## 2020-05-20 DIAGNOSIS — M2550 Pain in unspecified joint: Secondary | ICD-10-CM | POA: Diagnosis not present

## 2020-05-20 DIAGNOSIS — Z79899 Other long term (current) drug therapy: Secondary | ICD-10-CM | POA: Diagnosis not present

## 2020-05-20 DIAGNOSIS — R21 Rash and other nonspecific skin eruption: Secondary | ICD-10-CM | POA: Diagnosis not present

## 2020-05-20 DIAGNOSIS — M0579 Rheumatoid arthritis with rheumatoid factor of multiple sites without organ or systems involvement: Secondary | ICD-10-CM | POA: Diagnosis not present

## 2020-08-25 DIAGNOSIS — M79662 Pain in left lower leg: Secondary | ICD-10-CM | POA: Diagnosis not present

## 2020-08-25 DIAGNOSIS — M7989 Other specified soft tissue disorders: Secondary | ICD-10-CM | POA: Diagnosis not present

## 2020-08-25 DIAGNOSIS — Z9049 Acquired absence of other specified parts of digestive tract: Secondary | ICD-10-CM | POA: Diagnosis not present

## 2020-08-25 DIAGNOSIS — R600 Localized edema: Secondary | ICD-10-CM | POA: Diagnosis not present

## 2020-08-25 DIAGNOSIS — M79605 Pain in left leg: Secondary | ICD-10-CM | POA: Diagnosis not present

## 2020-08-25 DIAGNOSIS — M25552 Pain in left hip: Secondary | ICD-10-CM | POA: Diagnosis not present

## 2020-08-25 DIAGNOSIS — R200 Anesthesia of skin: Secondary | ICD-10-CM | POA: Diagnosis not present

## 2020-08-25 DIAGNOSIS — N200 Calculus of kidney: Secondary | ICD-10-CM | POA: Diagnosis not present

## 2020-08-25 DIAGNOSIS — M25572 Pain in left ankle and joints of left foot: Secondary | ICD-10-CM | POA: Diagnosis not present

## 2020-08-25 LAB — PROTIME-INR

## 2020-08-26 ENCOUNTER — Encounter: Payer: Self-pay | Admitting: Family Medicine

## 2020-08-26 ENCOUNTER — Ambulatory Visit (INDEPENDENT_AMBULATORY_CARE_PROVIDER_SITE_OTHER): Payer: BC Managed Care – PPO | Admitting: Family Medicine

## 2020-08-26 ENCOUNTER — Ambulatory Visit (INDEPENDENT_AMBULATORY_CARE_PROVIDER_SITE_OTHER): Payer: BC Managed Care – PPO

## 2020-08-26 ENCOUNTER — Other Ambulatory Visit: Payer: Self-pay

## 2020-08-26 VITALS — BP 129/81 | HR 99

## 2020-08-26 DIAGNOSIS — R21 Rash and other nonspecific skin eruption: Secondary | ICD-10-CM | POA: Diagnosis not present

## 2020-08-26 DIAGNOSIS — M25572 Pain in left ankle and joints of left foot: Secondary | ICD-10-CM | POA: Diagnosis not present

## 2020-08-26 DIAGNOSIS — M79662 Pain in left lower leg: Secondary | ICD-10-CM | POA: Diagnosis not present

## 2020-08-26 DIAGNOSIS — M5442 Lumbago with sciatica, left side: Secondary | ICD-10-CM

## 2020-08-26 DIAGNOSIS — Z79899 Other long term (current) drug therapy: Secondary | ICD-10-CM | POA: Diagnosis not present

## 2020-08-26 DIAGNOSIS — M2550 Pain in unspecified joint: Secondary | ICD-10-CM | POA: Diagnosis not present

## 2020-08-26 DIAGNOSIS — M0579 Rheumatoid arthritis with rheumatoid factor of multiple sites without organ or systems involvement: Secondary | ICD-10-CM | POA: Diagnosis not present

## 2020-08-26 MED ORDER — OXYCODONE-ACETAMINOPHEN 7.5-325 MG PO TABS: 1.0000 | ORAL_TABLET | Freq: Four times a day (QID) | ORAL | 0 refills | Status: DC | PRN

## 2020-08-26 MED ORDER — GABAPENTIN 300 MG PO CAPS: ORAL_CAPSULE | ORAL | 3 refills | Status: DC

## 2020-08-26 NOTE — Progress Notes (Signed)
Office Visit Note   Patient: Olivia Sims           Date of Birth: 1970/11/13           MRN: [id number] Visit Date: 08/26/2020 Requested by: Avanell Shackleton, NP-C [address] [place],  [place] [address] PCP: Avanell Shackleton, NP-C  Subjective: No chief complaint on file.   HPI: She is here with left leg pain.  Symptoms started yesterday with a sudden onset of left posterior heel pain, without injury.  The pain intensified to the point that she was in tears.  She went to the hospital where x-rays, blood work, and Doppler studies were all negative.  She was given Percocet and then Dilaudid for her pain.  She still could not sleep last night.  She went to her rheumatologist this morning who gave her a steroid injection IM.  Pain is much worse when she partially bends at the waist.  She has noticed pain in the left posterior hip as well.  No history of gout but she does have rheumatoid arthritis.  Recently her right knee was bothering her but it feels better now.                ROS:   All other systems were reviewed and are negative.  Objective: Vital Signs: BP 129/81   Pulse 99   LMP 04/19/2019   Physical Exam:  General:  Alert and oriented, in no acute distress. Pulm:  Breathing unlabored. Psy:  Normal mood, congruent affect. Skin: No visible rash Left leg: She is very tender to palpation in the left sciatic notch.  No tenderness of the lumbar spinous processes.  No pain with internal hip rotation.  Negative straight leg raise.  Lower extremity strength and reflexes are normal.  The left heel is tender at the Achilles insertion.  No detectable swelling.    Imaging: XR Lumbar Spine 2-3 Views  Result Date: 08/26/2020 X-rays lumbar spine reveal early endplate degenerative changes but overall good disc spacing.  No sign of compression fracture or neoplasm.   Assessment & Plan: Severe left posterior hip and leg pain, suspicious for lumbar disc protrusion.  Neurologic exam is  nonfocal. -Percocet for pain, gabapentin at night.  MRI lumbar spine ordered.  Consider epidural injection depending on the findings.  Home McKenzie exercises given.     Procedures: No procedures performed        PMFS History: Patient Active Problem List   Diagnosis Date Noted   History of COVID-19 10/29/2019   Status post laparoscopic hysterectomy 05/14/2019   Elevated LDL cholesterol level    Obesity (BMI 30-39.9) 01/08/2019   Prediabetes 01/08/2019   Nail deformity 01/08/2019   Vitamin D deficiency 01/08/2019   Exercise-induced asthma    Rheumatoid arthritis (HCC)    Kidney stones    Depression    Past Medical History:  Diagnosis Date   Complication of anesthesia    bladder " froze' years ago per pt    Depression    Elevated LDL cholesterol level    Endometrial polyp 2021   Exercise-induced asthma    pt denies at 4/20/visit    History of kidney stones    Kidney stones    Pre-diabetes    Prediabetes    Rheumatoid arthritis (HCC)    Right ovarian cyst 2021   Sleep apnea    hx of had surgery to correct    Urinary incontinence    Vitamin D deficiency  Family History  Problem Relation Age of Onset   Diabetes Mother    Hypothyroidism Mother    Heart Problems Mother        pacemaker    Cancer Mother        endometrial ca?   Thyroid disease Mother    Hypertension Father    Diabetes Father    Non-Hodgkin's lymphoma Paternal Grandmother     Past Surgical History:  Procedure Laterality Date   CYSTOSCOPY N/A 05/14/2019   Procedure: CYSTOSCOPY;  Surgeon: Patton Salles, MD;  Location: [facility];  Service: Gynecology;  Laterality: N/A;   GALLBLADDER SURGERY     KIDNEY STONE SURGERY     OVARIAN CYST REMOVAL Right 05/14/2019   Procedure: Excision RIGHT Para Tubal CYST;  Surgeon: Patton Salles, MD;  Location: [facility];  Service: Gynecology;  Laterality: Right;  right adnexal cyst removal   PALATE / UVULA  BIOPSY / EXCISION     shoulder surgery      TONSILLECTOMY     TOTAL LAPAROSCOPIC HYSTERECTOMY WITH SALPINGECTOMY N/A 05/14/2019   Procedure: TOTAL LAPAROSCOPIC HYSTERECTOMY WITH SALPINGECTOMY, Pelvic Washings;  Surgeon: Patton Salles, MD;  Location: [facility];  Service: Gynecology;  Laterality: N/A;   Social History   Occupational History   Not on file  Tobacco Use   Smoking status: Former    Packs/day: 1.00    Years: 16.00    Pack years: 16.00    Types: Cigarettes    Quit date: 2002    Years since quitting: 20.6   Smokeless tobacco: Never  Vaping Use   Vaping Use: Never used  Substance and Sexual Activity   Alcohol use: Yes    Alcohol/week: 1.0 standard drink    Types: 1 Glasses of wine per week    Comment: once a week   Drug use: Never   Sexual activity: Not Currently    Birth control/protection: Abstinence

## 2020-08-26 NOTE — Progress Notes (Signed)
Yesterday started to have left ankle pain "all of a sudden". No injury. Went to [facility] ER yesterday. Doppler ruled out clot. Xrays negative. Ruled out blockage. Wearing aircast and post op shoe. Went to rheumatologist this morning  Had IM injection of steroids there.  Today having pain in the leg/sciatica area. Ambulates with crutches.

## 2020-08-29 ENCOUNTER — Other Ambulatory Visit: Payer: Self-pay

## 2020-08-29 ENCOUNTER — Telehealth: Payer: Self-pay | Admitting: Family Medicine

## 2020-08-29 ENCOUNTER — Ambulatory Visit
Admission: RE | Admit: 2020-08-29 | Discharge: 2020-08-29 | Disposition: A | Discharge status: Home / Self Care | Payer: BC Managed Care – PPO | Source: Ambulatory Visit | Attending: Family Medicine | Admitting: Family Medicine

## 2020-08-29 DIAGNOSIS — M545 Low back pain, unspecified: Secondary | ICD-10-CM | POA: Diagnosis not present

## 2020-08-29 DIAGNOSIS — M48061 Spinal stenosis, lumbar region without neurogenic claudication: Secondary | ICD-10-CM | POA: Diagnosis not present

## 2020-08-29 DIAGNOSIS — M25572 Pain in left ankle and joints of left foot: Secondary | ICD-10-CM

## 2020-08-29 DIAGNOSIS — M5442 Lumbago with sciatica, left side: Secondary | ICD-10-CM

## 2020-08-29 NOTE — Telephone Encounter (Signed)
Lumbar MRI scan shows some early degenerative changes, but no sign of disc protrusion or nerve impingement.  No indication for surgery.

## 2020-09-01 NOTE — Telephone Encounter (Signed)
Noted  

## 2020-09-01 NOTE — Addendum Note (Signed)
Addended by: Lillia Carmel on: 09/01/2020 08:14 AM   Modules accepted: Orders

## 2020-09-02 MED ORDER — BACLOFEN 10 MG PO TABS: 5.0000 mg | ORAL_TABLET | Freq: Three times a day (TID) | ORAL | 3 refills | Status: DC | PRN

## 2020-09-02 NOTE — Addendum Note (Signed)
Addended by: Lillia Carmel on: 09/02/2020 08:01 AM   Modules accepted: Orders

## 2020-12-10 IMAGING — MG DIGITAL DIAGNOSTIC BILAT W/ TOMO W/ CAD
6 of 12 series · 6 of 36 positions shown · non-contrast
Comparison: Previous exam(s).

CLINICAL DATA: 48-year-old female presenting for annual exam as
well as follow-up of a probably benign left breast mass that was
aspirated.

EXAM:
DIGITAL DIAGNOSTIC BILATERAL MAMMOGRAM WITH CAD AND TOMO
ULTRASOUND LEFT BREAST

[L CC synth-2D (1 of 2)]
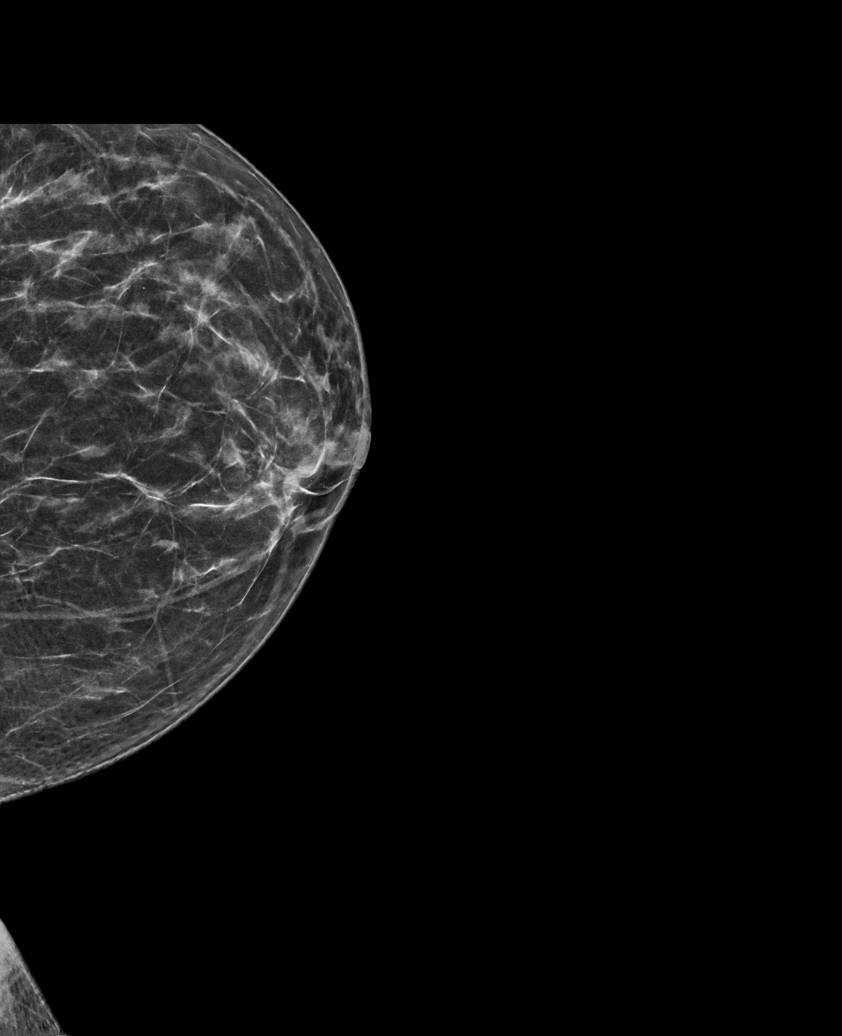

[R CC synth-2D (1 of 2)]
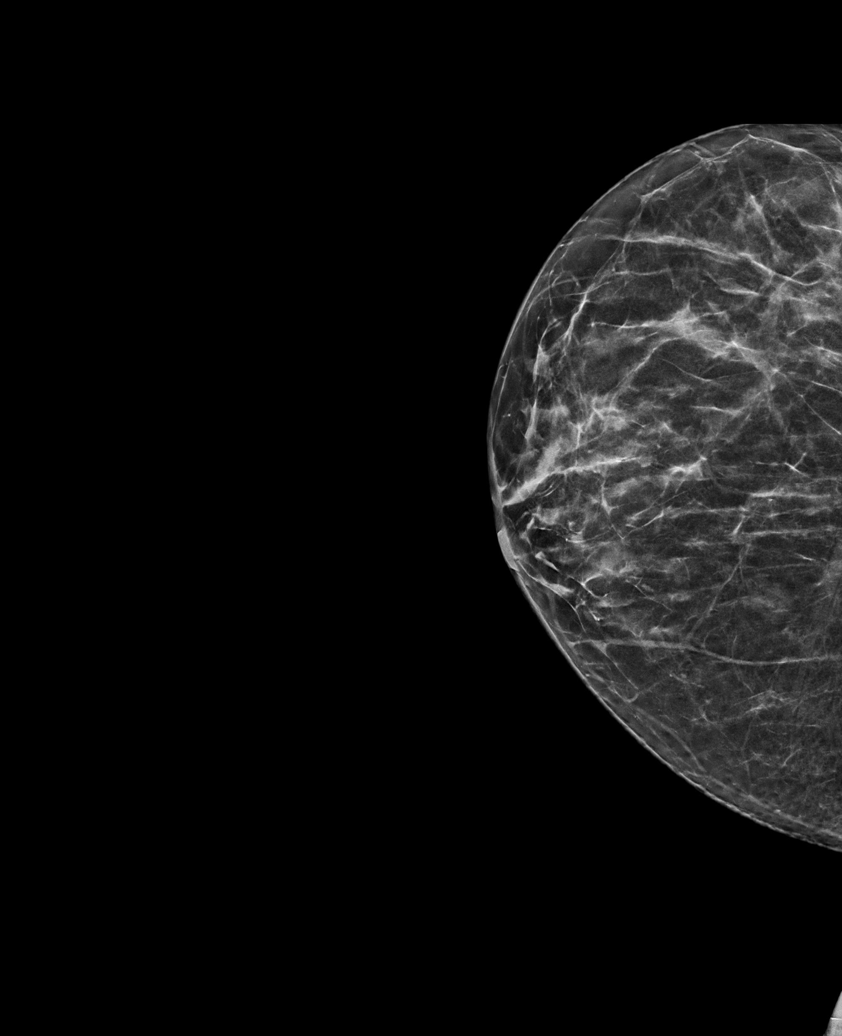

[R CC synth-2D (2 of 2)]
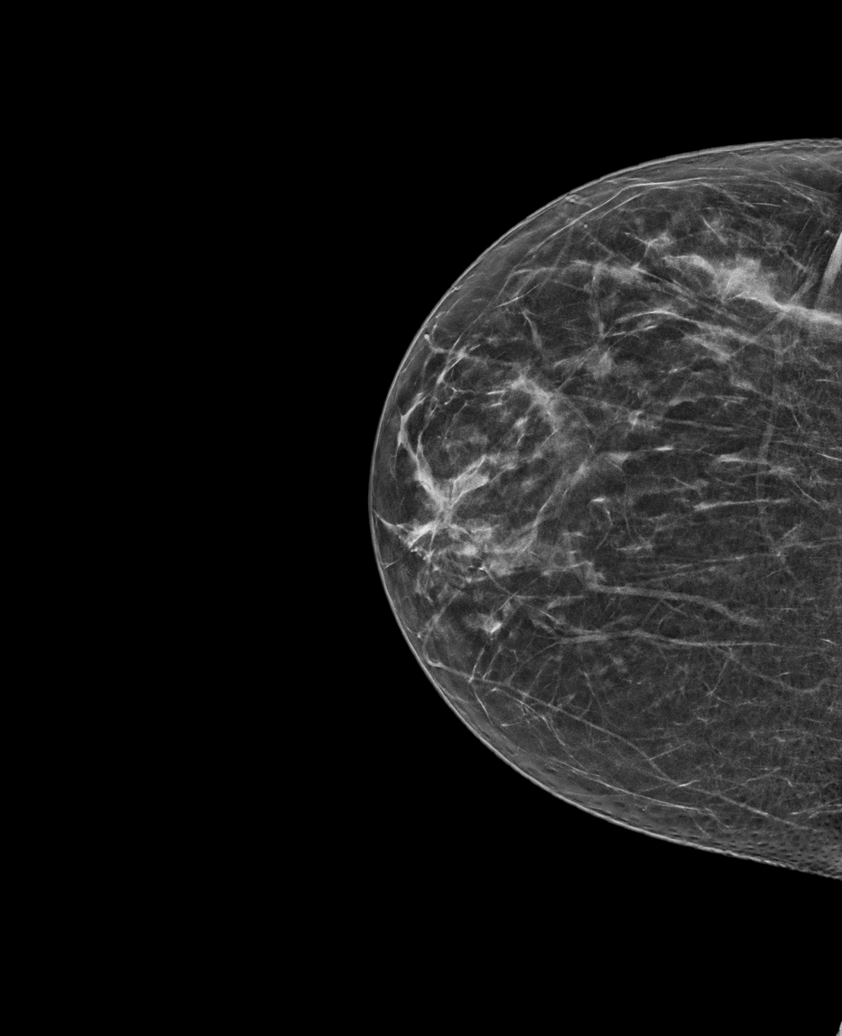

[R MLO synth-2D]
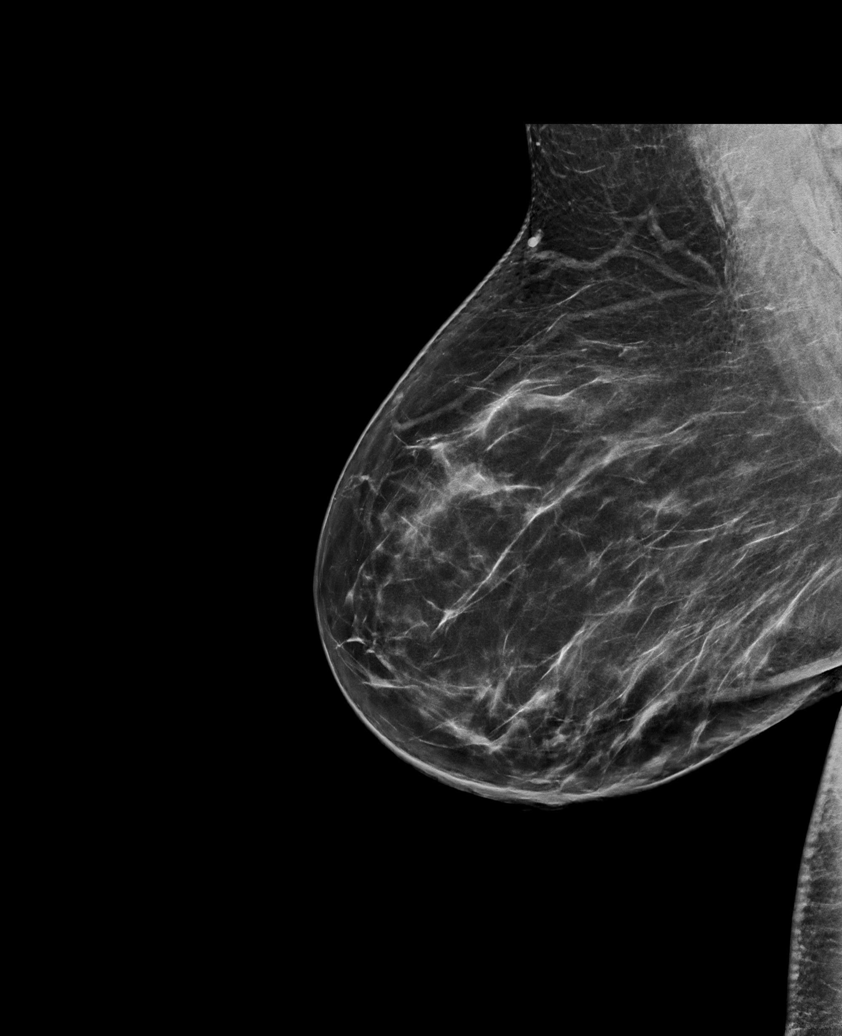

[L MLO synth-2D]
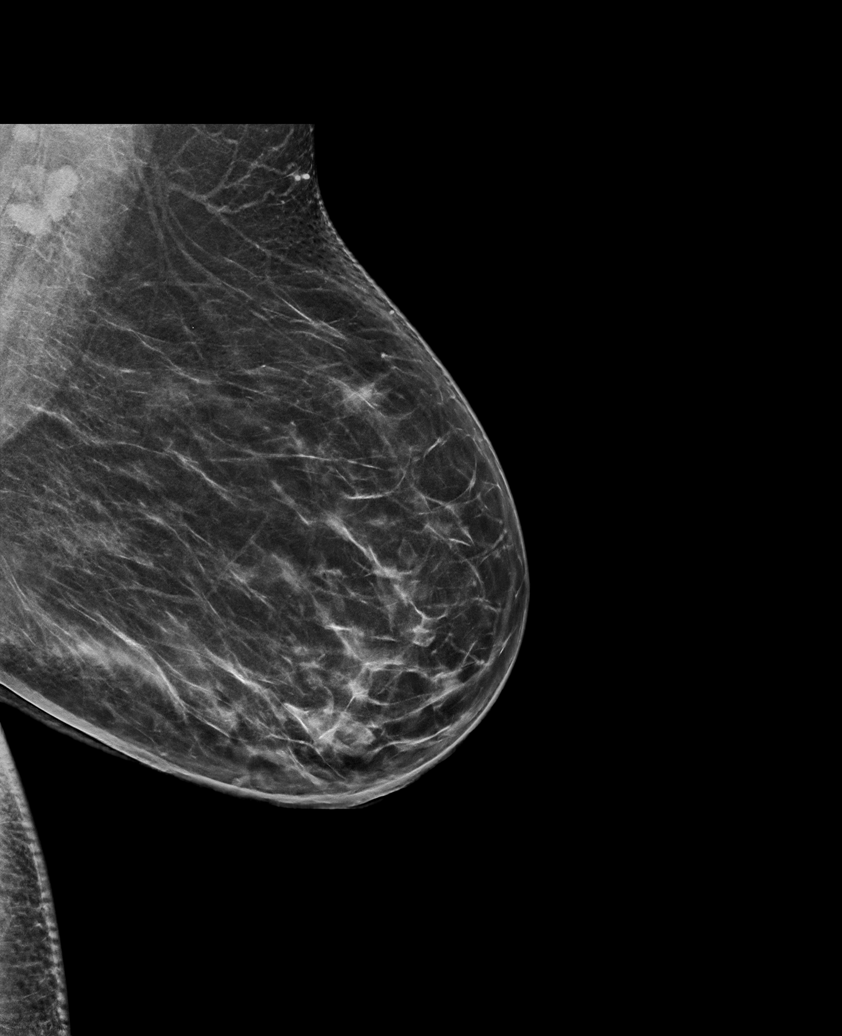

[L CC synth-2D (2 of 2)]
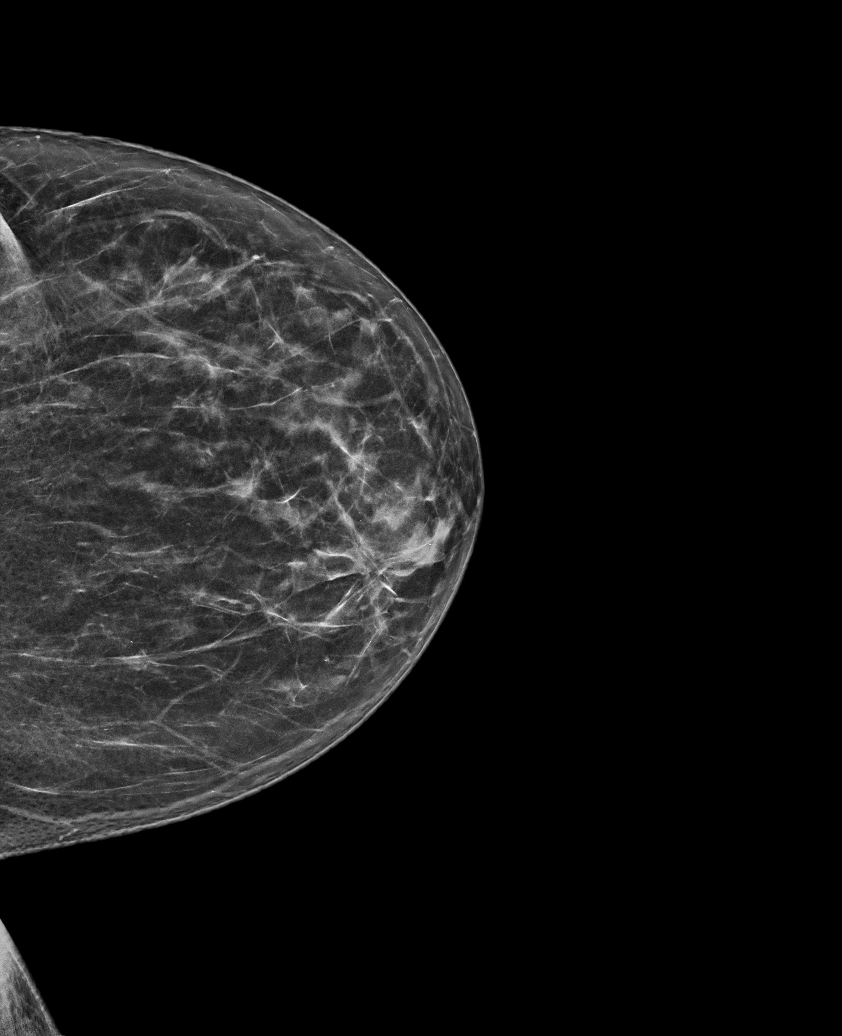

[6 of 36 positions shown; findings below may reference images not displayed]

ACR Breast Density Category b: There are scattered areas of
fibroglandular density.
FINDINGS: Mammogram:

Right breast No suspicious mass, distortion, or microcalcifications
are identified to suggest presence of malignancy. The previously
seen circumscribed mass in the upper outer right breast is no longer
definitely visualized.

Left breast: No suspicious mass, distortion, or microcalcifications
are identified to suggest presence of malignancy.

Mammographic images were processed with CAD.

Ultrasound:

Targeted ultrasound is performed throughout the upper-outer quadrant
of the right breast. At the site of the previously seen thick-walled
cyst at 10 o'clock 8 cm from the nipple there is a round
circumscribed anechoic mass measuring 0.3 x 0.2 x 0.3 cm, consistent
with a simple cyst. No solid component or internal blood flow
identified. The aspirated cyst measured up to 1.0 cm. There is an
incidentally noted similar appearing simple cyst at 10 o'clock 10 cm
from the nipple. No suspicious solid mass identified.
IMPRESSION: No mammographic evidence of malignancy in the bilateral breasts. At
the site of prior aspiration in the right breast at 10 o'clock there
is a 0.3 cm simple cyst.

RECOMMENDATION:
Screening mammogram in one year.(Code:N5-P-6AG)

I have discussed the findings and recommendations with the patient.
If applicable, a reminder letter will be sent to the patient
regarding the next appointment.

BI-RADS CATEGORY  2: Benign.

## 2020-12-18 ENCOUNTER — Other Ambulatory Visit: Payer: Self-pay

## 2020-12-18 ENCOUNTER — Emergency Department (HOSPITAL_BASED_OUTPATIENT_CLINIC_OR_DEPARTMENT_OTHER)
Admission: EM | Admit: 2020-12-18 | Discharge: 2020-12-18 | Disposition: A | Discharge status: Home / Self Care | Payer: Self-pay | Attending: Emergency Medicine | Admitting: Emergency Medicine

## 2020-12-18 ENCOUNTER — Encounter (HOSPITAL_BASED_OUTPATIENT_CLINIC_OR_DEPARTMENT_OTHER): Payer: Self-pay | Admitting: Emergency Medicine

## 2020-12-18 ENCOUNTER — Other Ambulatory Visit (HOSPITAL_BASED_OUTPATIENT_CLINIC_OR_DEPARTMENT_OTHER): Payer: Self-pay

## 2020-12-18 DIAGNOSIS — M25562 Pain in left knee: Secondary | ICD-10-CM | POA: Insufficient documentation

## 2020-12-18 DIAGNOSIS — Z8616 Personal history of COVID-19: Secondary | ICD-10-CM | POA: Insufficient documentation

## 2020-12-18 DIAGNOSIS — M25561 Pain in right knee: Secondary | ICD-10-CM | POA: Insufficient documentation

## 2020-12-18 DIAGNOSIS — Z87891 Personal history of nicotine dependence: Secondary | ICD-10-CM | POA: Insufficient documentation

## 2020-12-18 MED ORDER — OXYCODONE-ACETAMINOPHEN 5-325 MG PO TABS: 1.0000 | ORAL_TABLET | Freq: Three times a day (TID) | ORAL | 0 refills | Status: DC | PRN

## 2020-12-18 MED ORDER — PREDNISONE 20 MG PO TABS: 40.0000 mg | ORAL_TABLET | Freq: Every day | ORAL | 0 refills | Status: DC

## 2020-12-18 MED ORDER — PREDNISONE 20 MG PO TABS
40.0000 mg | ORAL_TABLET | Freq: Every day | ORAL | 0 refills | Status: DC
  Filled 2020-12-18: qty 10, 5d supply, fill #0

## 2020-12-18 MED ORDER — OXYCODONE-ACETAMINOPHEN 5-325 MG PO TABS
1.0000 | ORAL_TABLET | Freq: Three times a day (TID) | ORAL | 0 refills | Status: DC | PRN
  Filled 2020-12-18: qty 8, 2d supply, fill #0

## 2020-12-18 NOTE — ED Triage Notes (Addendum)
Pt arrives to ED with c/o bilateral knee pain. This started x1 week ago. Left >  Right. Pt reports both knees are swollen and warm. The pain is worse in the mornings and nights. Pt with hx of Rheumatoid Arthritis. Pain is described as achy. Pain 7/10. No numbness/tingling in legs. No fevers. Pt currently on Enbrel and Methotrexate.

## 2020-12-18 NOTE — ED Provider Notes (Signed)
[facility] EMERGENCY DEPT Provider Note   CSN: [id number] Arrival date & time: 12/18/20  0825     History Chief Complaint  Patient presents with   Knee Pain    Olivia Sims is a 50 y.o. female.   Knee Pain Patient presents knee pain.  Began around a week ago.  Worse in the left knee but also involves right knee.  Worse with movement.  Does have a history of both arthritis and rheumatoid arthritis.  No trauma.  No fevers.  States she had been without insurance and had some variation in her rheumatoid arthritis treatment.  Recently got Enbrel again and is due for methotrexate next week.  No fevers or chills.  Pain is worse with movement.  States she has various ambulatory aids at home to help.    Past Medical History:  Diagnosis Date   Complication of anesthesia    bladder " froze' years ago per pt    Depression    Elevated LDL cholesterol level    Endometrial polyp 2021   Exercise-induced asthma    pt denies at 4/20/visit    History of kidney stones    Kidney stones    Pre-diabetes    Prediabetes    Rheumatoid arthritis (HCC)    Right ovarian cyst 2021   Sleep apnea    hx of had surgery to correct    Urinary incontinence    Vitamin D deficiency     Patient Active Problem List   Diagnosis Date Noted   History of COVID-19 10/29/2019   Status post laparoscopic hysterectomy 05/14/2019   Elevated LDL cholesterol level    Obesity (BMI 30-39.9) 01/08/2019   Prediabetes 01/08/2019   Nail deformity 01/08/2019   Vitamin D deficiency 01/08/2019   Exercise-induced asthma    Rheumatoid arthritis (HCC)    Kidney stones    Depression     Past Surgical History:  Procedure Laterality Date   CYSTOSCOPY N/A 05/14/2019   Procedure: CYSTOSCOPY;  Surgeon: Patton Salles, MD;  Location: [facility];  Service: Gynecology;  Laterality: N/A;   GALLBLADDER SURGERY     KIDNEY STONE SURGERY     OVARIAN CYST REMOVAL Right 05/14/2019    Procedure: Excision RIGHT Para Tubal CYST;  Surgeon: Patton Salles, MD;  Location: [facility];  Service: Gynecology;  Laterality: Right;  right adnexal cyst removal   PALATE / UVULA BIOPSY / EXCISION     shoulder surgery      TONSILLECTOMY     TOTAL LAPAROSCOPIC HYSTERECTOMY WITH SALPINGECTOMY N/A 05/14/2019   Procedure: TOTAL LAPAROSCOPIC HYSTERECTOMY WITH SALPINGECTOMY, Pelvic Washings;  Surgeon: Patton Salles, MD;  Location[facility];  Service: Gynecology;  Laterality: N/A;     OB History     Gravida  0   Para  0   Term  0   Preterm  0   AB  0   Living  0      SAB  0   IAB  0   Ectopic  0   Multiple  0   Live Births  0           Family History  Problem Relation Age of Onset   Diabetes Mother    Hypothyroidism Mother    Heart Problems Mother        pacemaker    Cancer Mother        endometrial ca?  Thyroid disease Mother    Hypertension Father    Diabetes Father    Non-Hodgkin's lymphoma Paternal Grandmother     Social History   Tobacco Use   Smoking status: Former    Packs/day: 1.00    Years: 16.00    Pack years: 16.00    Types: Cigarettes    Quit date: 2002    Years since quitting: 20.9   Smokeless tobacco: Never  Vaping Use   Vaping Use: Never used  Substance Use Topics   Alcohol use: Yes    Alcohol/week: 1.0 standard drink    Types: 1 Glasses of wine per week    Comment: once a week   Drug use: Never    Home Medications Prior to Admission medications   Medication Sig Start Date End Date Taking? Authorizing Provider  oxyCODONE-acetaminophen (PERCOCET/ROXICET) 5-325 MG tablet Take 1-2 tablets by mouth every 8 (eight) hours as needed for severe pain. 12/18/20  Yes Benjiman Core, MD  predniSONE (DELTASONE) 20 MG tablet Take 2 tablets (40 mg total) by mouth daily. 12/18/20  Yes Benjiman Core, MD  albuterol (VENTOLIN HFA) 108 (90 Base) MCG/ACT inhaler Inhale 2 puffs into the  lungs 4 (four) times daily as needed. 11/28/18   [provider]  azithromycin (ZITHROMAX) 250 MG tablet Take 2 tablets on day 1, then 1 tablet on days 2-5. Patient not taking: Reported on 01/29/2020 10/31/19   Hetty Blend L, PA-C  baclofen (LIORESAL) 10 MG tablet Take 0.5-1 tablets (5-10 mg total) by mouth 3 (three) times daily as needed for muscle spasms. 09/02/20   Hilts, Casimiro Needle, MD  buPROPion (WELLBUTRIN XL) 150 MG 24 hr tablet TAKE 1 TABLET BY MOUTH EVERY MORNING 08/20/19   Mozingo, Thereasa Solo, NP  clobetasol cream (TEMOVATE) 0.05 % Apply 1 application topically 2 (two) times daily. Patient not taking: Reported on 01/29/2020 05/13/19   [provider]  DULoxetine (CYMBALTA) 30 MG capsule TAKE 3 CAPSULES BY MOUTH EVERY DAY 05/09/19   Mozingo, Thereasa Solo, NP  ENBREL SURECLICK 50 MG/ML injection Inject into the skin once a week. 07/12/19   [provider]  fexofenadine (ALLEGRA) 180 MG tablet Take 180 mg by mouth daily.    [provider]  folic acid (FOLVITE) 1 MG tablet Take 1 mg by mouth daily.    [provider]  gabapentin (NEURONTIN) 300 MG capsule 1 PO q HS, may increase to 1 PO BID if needed 08/26/20   Hilts, Michael, MD  ibuprofen (ADVIL) 800 MG tablet Take 1 tablet (800 mg total) by mouth every 8 (eight) hours as needed for mild pain or moderate pain. 05/14/19   Patton Salles, MD  Methotrexate Sodium (METHOTREXATE, PF,) 50 MG/2ML injection  05/13/19   [provider]  Vitamin D, Ergocalciferol, (DRISDOL) 1.25 MG (50000 UT) CAPS capsule Take 1 capsule (50,000 Units total) by mouth every 7 (seven) days. 01/09/19   Henson, Vickie L, PA-C    Allergies    Patient has no known allergies.  Review of Systems   Review of Systems  Constitutional:  Negative for appetite change.  Respiratory:  Negative for shortness of breath.   Cardiovascular:  Positive for leg swelling. Negative for chest pain.  Gastrointestinal:  Negative  for abdominal pain.  Musculoskeletal:        Bilateral knee pain.  Skin:  Negative for wound.  Neurological:  Negative for weakness.  Psychiatric/Behavioral:  Negative for confusion.    Physical Exam Updated Vital Signs BP Marland Kitchen)  153/95 (BP Location: Right Arm)   Pulse 86   Temp 98.1 F (36.7 C) (Oral)   Resp 18   Ht 5\' 3"  (1.6 m)   Wt 94.3 kg   LMP 04/19/2019   SpO2 98%   BMI 36.85 kg/m   Physical Exam Vitals and nursing note reviewed.  HENT:     Head: Atraumatic.  Cardiovascular:     Rate and Rhythm: Regular rhythm.  Abdominal:     Tenderness: There is no abdominal tenderness.  Musculoskeletal:        General: Tenderness present.     Cervical back: Neck supple.     Comments: Mild edema bilateral lower extremities.  Mild pain with movement of both knees.  Not irritable.  No large effusion.  No erythema.  Neurological:     Mental Status: She is alert and oriented to person, place, and time.  Psychiatric:        Mood and Affect: Mood normal.    ED Results / Procedures / Treatments   Labs (all labs ordered are listed, but only abnormal results are displayed) Labs Reviewed - No data to display  EKG None  Radiology No results found.  Procedures Procedures   Medications Ordered in ED Medications - No data to display  ED Course  I have reviewed the triage vital signs and the nursing notes.  Pertinent labs & imaging results that were available during my care of the patient were reviewed by me and considered in my medical decision making (see chart for details).    MDM Rules/Calculators/A&P                           Patient with bilateral knee pain.  Doubt infection.  No trauma.  Do not feel that any imaging at this time.  I think likely related to the rheumatoid arthritis.  Has had a inconsistent treatment.  We will treat with steroids for 5 days.  Also will give some pain medicines.  Follow-up with either her orthopedic specialist or rheumatologist. Final  Clinical Impression(s) / ED Diagnoses Final diagnoses:  Pain in both knees, unspecified chronicity    Rx / DC Orders ED Discharge Orders          Ordered    predniSONE (DELTASONE) 20 MG tablet  Daily        12/18/20 0850    oxyCODONE-acetaminophen (PERCOCET/ROXICET) 5-325 MG tablet  Every 8 hours PRN        12/18/20 0850             14/01/22, MD 12/18/20 [id number]

## 2020-12-18 NOTE — Discharge Instructions (Signed)
Follow-up with either your orthopedic surgeon or your rheumatologist.  The steroids hopefully should help with the possible inflammation.  Pain medicine will also help some.  Take care with the pain medicine as it could make you more unsteady

## 2021-01-22 IMAGING — US US PELVIS COMPLETE WITH TRANSVAGINAL
1 series · 13 of 25 positions shown · non-contrast
Comparison: None

CLINICAL DATA: Menorrhagia with irregular cycle for several years

EXAM:
TRANSABDOMINAL AND TRANSVAGINAL ULTRASOUND OF PELVIS
TECHNIQUE: Both transabdominal and transvaginal ultrasound examinations of the
pelvis were performed. Transabdominal technique was performed for
global imaging of the pelvis including uterus, ovaries, adnexal
regions, and pelvic cul-de-sac. It was necessary to proceed with
endovaginal exam following the transabdominal exam to visualize the
endometrium, and to characterize a RIGHT adnexal cystic lesion.

[Series 1: us pelvis complete with transvaginal · 0.23mm/px · 80 acquisitions, 13 frames shown]
[im 1/80]
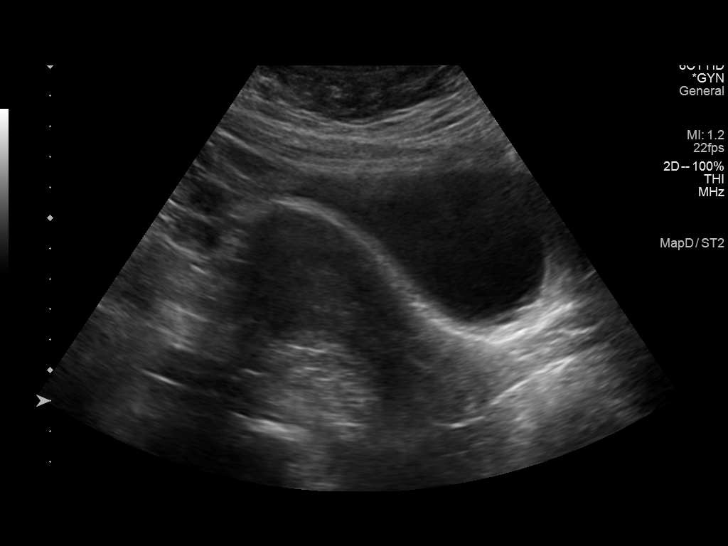
[im 7/80]
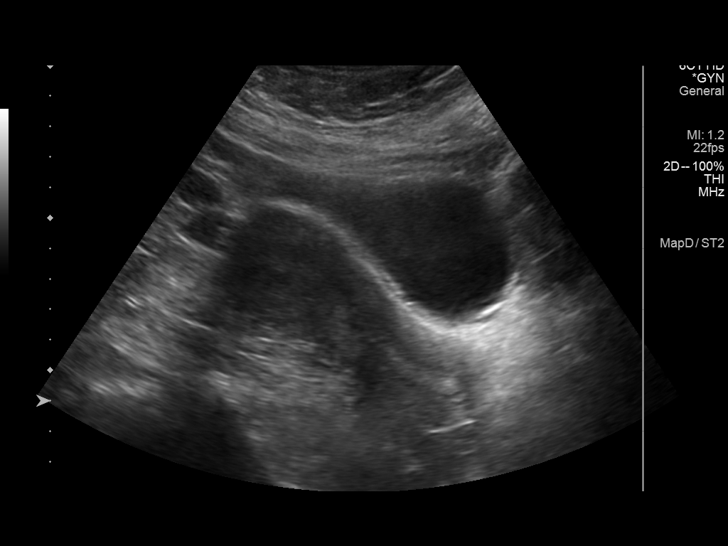
[im 14/80]
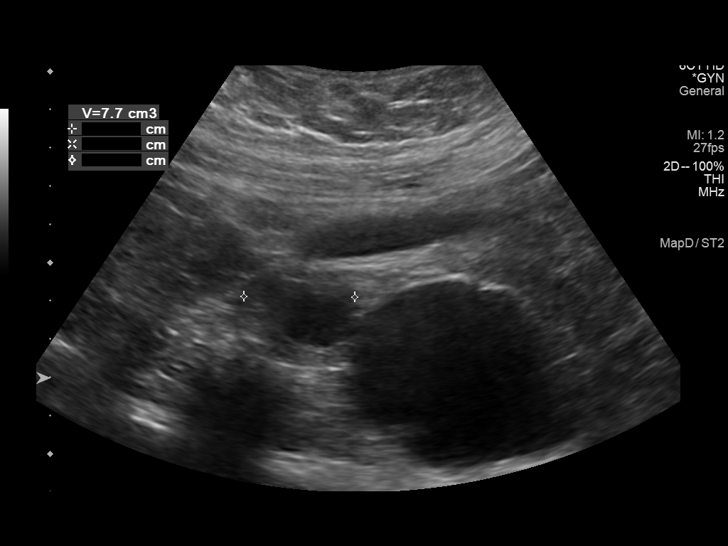
[im 20/80]
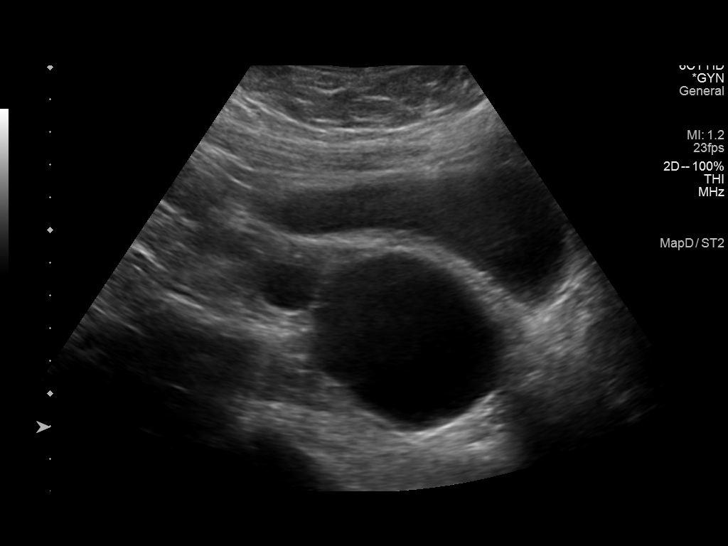
[im 27/80]
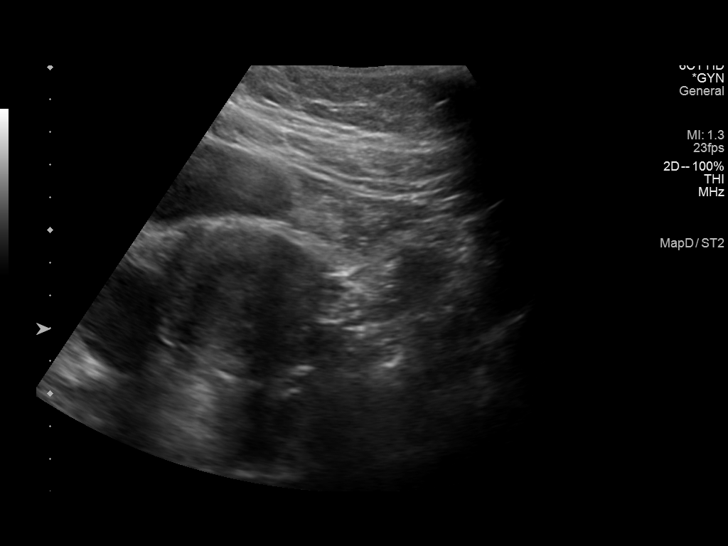
[im 33/80]
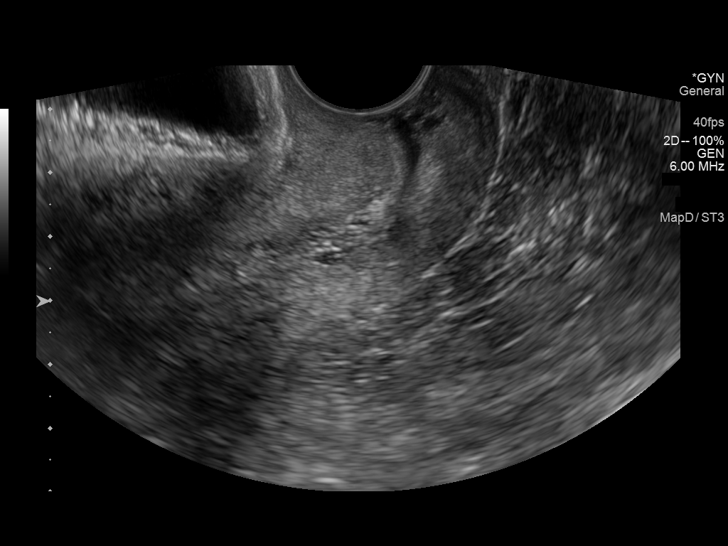
[im 40/80]
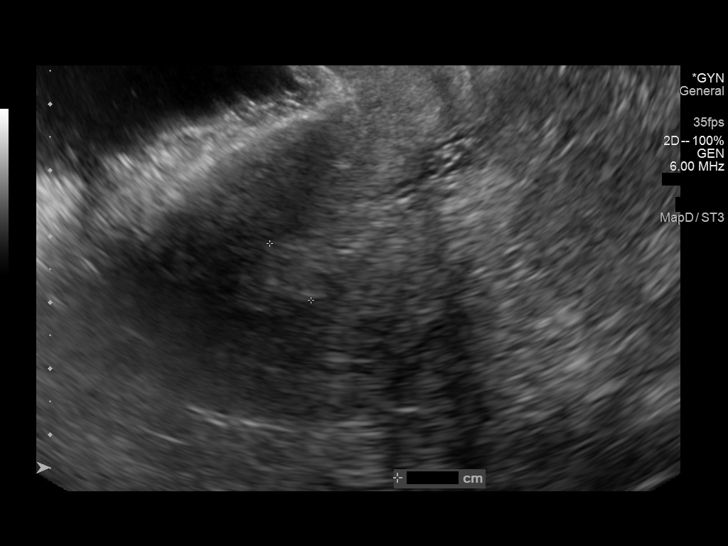
[im 47/80]
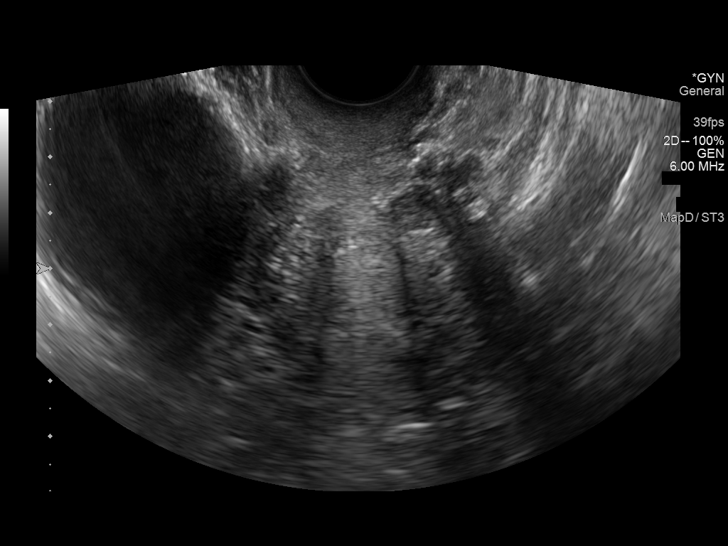
[im 53/80]
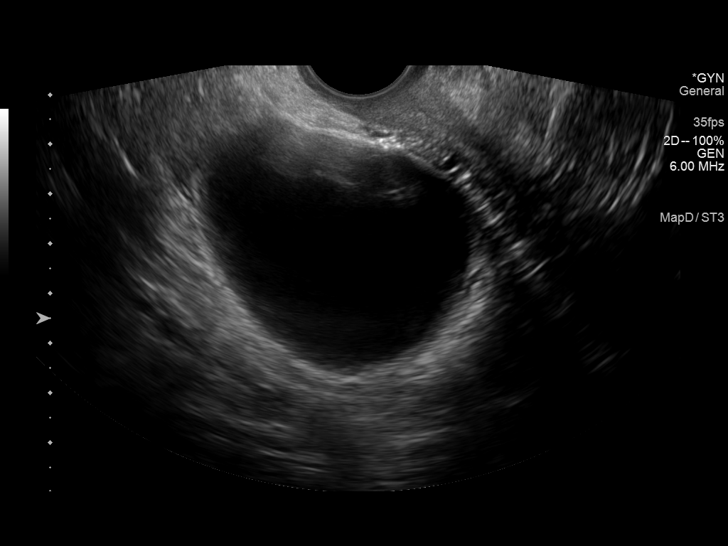
[im 60/80]
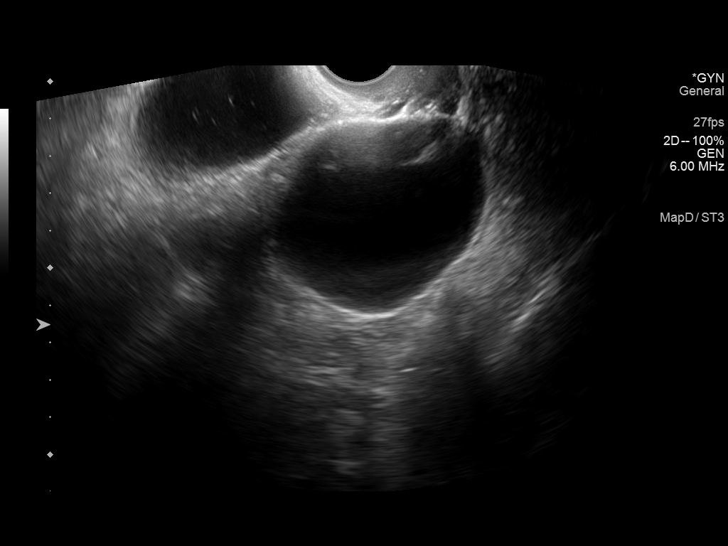
[im 66/80]
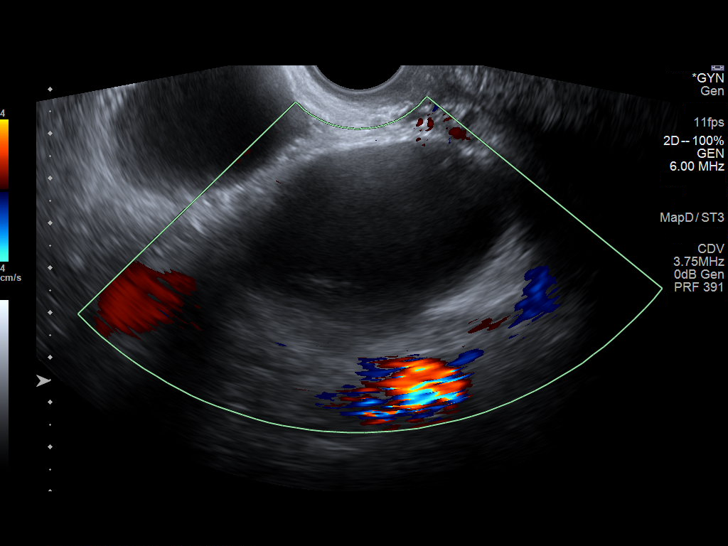
[im 73/80]
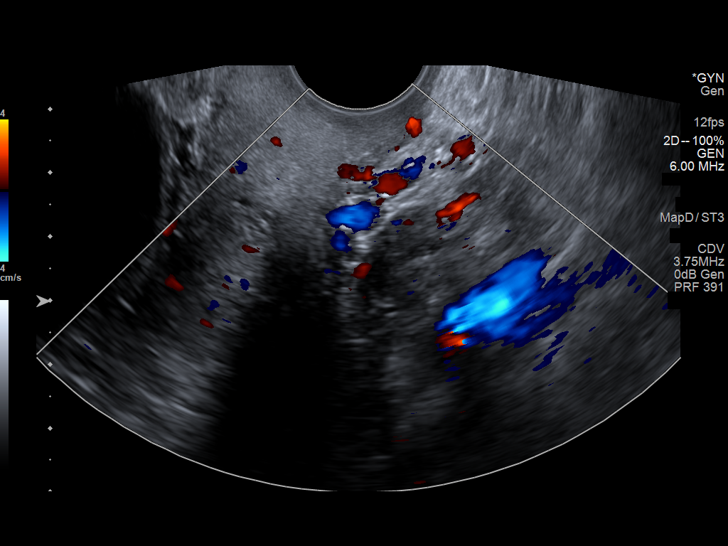
[im 80/80]
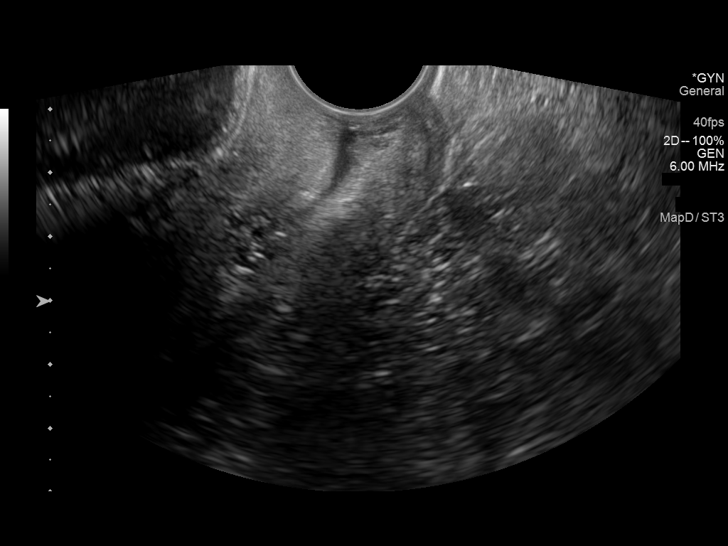

[13 of 25 positions shown; findings below may reference images not displayed]

FINDINGS: Uterus

Measurements: 9.6 x 4.1 x 5.3 cm = volume: 108 mL. Anteverted. Tiny
anterior wall submucosal leiomyoma at lower uterine segment 9 x 7 x
9 mm. No additional masses.

Endometrium

Thickness: 11 mm.  No endometrial fluid or focal abnormality

Right ovary

Measurements: 3.0 x 1.7 x 2.9 cm = volume: 7.7 mL. Normal morphology
without mass, see below

Left ovary

Measurements: 2.0 x 1.4 x 2.0 cm = volume: 2.9 mL. Normal morphology
without mass

Other findings

Large cystic lesion identified in RIGHT adnexa 5.7 x 5.0 x 5.5 cm in
size, abutting the RIGHT ovary. This could represent a RIGHT ovarian
or paraovarian cyst. Lesion demonstrates a smooth wall without
definite mural nodularity. Internal echogenicity at portion closest
to the transducer is seen likely near field artifact. No definite
septations or internal complexity identified.
IMPRESSION: Tiny submucosal leiomyoma of the anterior lower uterus 9 mm
diameter.

Otherwise unremarkable uterus, endometrial complex and LEFT ovary.

5.7 cm diameter generally simple appearing exophytic RIGHT ovarian
versus paraovarian cyst.

Follow-up transvaginal ultrasound recommended in 6 months to
demonstrate stability.

## 2021-03-31 ENCOUNTER — Encounter: Payer: Self-pay | Admitting: Physician Assistant

## 2021-03-31 ENCOUNTER — Ambulatory Visit: Payer: Managed Care, Other (non HMO) | Admitting: Physician Assistant

## 2021-03-31 VITALS — BP 145/84 | HR 78 | Temp 98.0°F | Ht 63.0 in | Wt 218.2 lb

## 2021-03-31 DIAGNOSIS — R030 Elevated blood-pressure reading, without diagnosis of hypertension: Secondary | ICD-10-CM

## 2021-03-31 DIAGNOSIS — E559 Vitamin D deficiency, unspecified: Secondary | ICD-10-CM | POA: Diagnosis not present

## 2021-03-31 DIAGNOSIS — G4733 Obstructive sleep apnea (adult) (pediatric): Secondary | ICD-10-CM

## 2021-03-31 DIAGNOSIS — M2550 Pain in unspecified joint: Secondary | ICD-10-CM | POA: Diagnosis not present

## 2021-03-31 DIAGNOSIS — R7303 Prediabetes: Secondary | ICD-10-CM | POA: Diagnosis not present

## 2021-03-31 DIAGNOSIS — M069 Rheumatoid arthritis, unspecified: Secondary | ICD-10-CM

## 2021-03-31 DIAGNOSIS — R600 Localized edema: Secondary | ICD-10-CM

## 2021-03-31 DIAGNOSIS — H6981 Other specified disorders of Eustachian tube, right ear: Secondary | ICD-10-CM

## 2021-03-31 LAB — CBC WITH DIFFERENTIAL/PLATELET
Basophils Absolute: 0.1 10*3/uL (ref 0.0–0.1)
Basophils Relative: 0.6 % (ref 0.0–3.0)
Eosinophils Absolute: 0.1 10*3/uL (ref 0.0–0.7)
Eosinophils Relative: 1.3 % (ref 0.0–5.0)
HCT: 40.3 % (ref 36.0–46.0)
Hemoglobin: 13.5 g/dL (ref 12.0–15.0)
Lymphocytes Relative: 25.2 % (ref 12.0–46.0)
Lymphs Abs: 2.2 10*3/uL (ref 0.7–4.0)
MCHC: 33.4 g/dL (ref 30.0–36.0)
MCV: 88.7 fl (ref 78.0–100.0)
Monocytes Absolute: 1.3 10*3/uL — ABNORMAL HIGH (ref 0.1–1.0)
Monocytes Relative: 14.1 % — ABNORMAL HIGH (ref 3.0–12.0)
Neutro Abs: 5.2 10*3/uL (ref 1.4–7.7)
Neutrophils Relative %: 58.8 % (ref 43.0–77.0)
Platelets: 293 10*3/uL (ref 150.0–400.0)
RBC: 4.54 Mil/uL (ref 3.87–5.11)
RDW: 13.7 % (ref 11.5–15.5)
WBC: 8.9 10*3/uL (ref 4.0–10.5)

## 2021-03-31 LAB — COMPREHENSIVE METABOLIC PANEL
ALT: 14 U/L (ref 0–35)
AST: 13 U/L (ref 0–37)
Albumin: 4.5 g/dL (ref 3.5–5.2)
Alkaline Phosphatase: 60 U/L (ref 39–117)
BUN: 21 mg/dL (ref 6–23)
CO2: 24 mEq/L (ref 19–32)
Calcium: 9.5 mg/dL (ref 8.4–10.5)
Chloride: 101 mEq/L (ref 96–112)
Creatinine, Ser: 0.73 mg/dL (ref 0.40–1.20)
GFR: 94.93 mL/min (ref >60.00)
Glucose, Bld: 94 mg/dL (ref 70–99)
Potassium: 4.2 mEq/L (ref 3.5–5.1)
Sodium: 135 mEq/L (ref 135–145)
Total Bilirubin: 0.3 mg/dL (ref 0.2–1.2)
Total Protein: 7.5 g/dL (ref 6.0–8.3)

## 2021-03-31 LAB — URIC ACID: Uric Acid, Serum: 3.8 mg/dL (ref 2.4–7.0)

## 2021-03-31 LAB — VITAMIN D 25 HYDROXY (VIT D DEFICIENCY, FRACTURES): VITD: 14.39 ng/mL — ABNORMAL LOW (ref 30.00–100.00)

## 2021-03-31 LAB — HEMOGLOBIN A1C: Hgb A1c MFr Bld: 5.8 % (ref 4.6–6.5)

## 2021-03-31 NOTE — Progress Notes (Signed)
? ?Subjective:  ? ? Patient ID: Olivia Sims, female    DOB: January 27, 1970, 51 y.o.   MRN: [id number] ? ?Chief Complaint  ?Patient presents with  ? Establish Care  ? ? ?HPI ?51 y.o. patient presents today for new patient establishment with me.  Patient was previously established with Olivia Dingwall, NP-C. ? ?Current Care Team: ?[name]; Dr. Amil Amen - Rheumatology  ?Dr. Quincy Simmonds - GYN  ?Pain Management - new pt est on May 15 coming up  ? ?Acute Concerns: ?-Requesting sleep study - partner notes that she stops breathing occasionally and loud snoring; hx of uvula removal and tonsillectomy in her 17s that was contributing to her sleep apnea  ?-Stays tired all the time; works full-time  ? ?Bilateral lower leg edema. Equal bilaterally, ongoing over the last few years. Goes into ankles and feet as well.  ? ?Sinusitis a few weeks ago, hearing change out of R ear ? ? ?Chronic Concerns: ?See PMH listed below, as well as A/P for details on issues we specifically discussed during today's visit.   ? ? ? ?Past Medical History:  ?Diagnosis Date  ? Allergy   ? Complication of anesthesia   ? bladder " froze' years ago per pt   ? Depression   ? Elevated LDL cholesterol level   ? Endometrial polyp 2021  ? Exercise-induced asthma   ? pt denies at 4/20/visit   ? History of kidney stones   ? Kidney stones   ? Pre-diabetes   ? Prediabetes   ? Rheumatoid arthritis ([place])   ? Right ovarian cyst 2021  ? Sleep apnea   ? hx of had surgery to correct   ? Urinary incontinence   ? Vitamin D deficiency   ? ? ?Past Surgical History:  ?Procedure Laterality Date  ? CYSTOSCOPY N/A 05/14/2019  ? Procedure: CYSTOSCOPY;  Surgeon: Nunzio Cobbs, MD;  Location: [facility];  Service: Gynecology;  Laterality: N/A;  ? GALLBLADDER SURGERY    ? KIDNEY STONE SURGERY    ? OVARIAN CYST REMOVAL Right 05/14/2019  ? Procedure: Excision RIGHT Para Tubal CYST;  Surgeon: Nunzio Cobbs, MD;  Location: [facility];   Service: Gynecology;  Laterality: Right;  right adnexal cyst removal  ? PALATE / UVULA BIOPSY / EXCISION    ? shoulder surgery     ? TONSILLECTOMY    ? TOTAL LAPAROSCOPIC HYSTERECTOMY WITH SALPINGECTOMY N/A 05/14/2019  ? Procedure: TOTAL LAPAROSCOPIC HYSTERECTOMY WITH SALPINGECTOMY, Pelvic Washings;  Surgeon: Nunzio Cobbs, MD;  Location[facility];  Service: Gynecology;  Laterality: N/A;  ? ? ?Family History  ?Problem Relation Age of Onset  ? Diabetes Mother   ? Hypothyroidism Mother   ? Heart Problems Mother   ?     pacemaker   ? Cancer Mother   ?     endometrial ca?  ? Thyroid disease Mother   ? Hypertension Father   ? Diabetes Father   ? Non-Hodgkin's lymphoma Paternal Grandmother   ? ? ?Social History  ? ?Tobacco Use  ? Smoking status: Former  ?  Packs/day: 1.00  ?  Years: 16.00  ?  Pack years: 16.00  ?  Types: Cigarettes  ?  Quit date: 2002  ?  Years since quitting: 21.2  ? Smokeless tobacco: Never  ?Vaping Use  ? Vaping Use: Never used  ?Substance Use Topics  ? Alcohol use: Yes  ?  Alcohol/week: 1.0 standard  drink  ?  Types: 1 Glasses of wine per week  ?  Comment: Occ  ? Drug use: Never  ?  ? ?No Known Allergies ? ?Review of Systems ?NEGATIVE UNLESS OTHERWISE INDICATED IN HPI ? ? ?   ?Objective:  ?  ? ?BP (!) 145/84   Pulse 78   Temp 98 ?F (36.7 ?C)   Ht 5\' 3"  (1.6 m)   Wt 218 lb 4 oz (99 kg)   LMP 04/19/2019   SpO2 98%   BMI 38.66 kg/m?  ? ?Wt Readings from Last 3 Encounters:  ?03/31/21 218 lb 4 oz (99 kg)  ?12/18/20 208 lb (94.3 kg)  ?01/29/20 207 lb (93.9 kg)  ? ? ?BP Readings from Last 3 Encounters:  ?03/31/21 (!) 145/84  ?12/18/20 (!) 153/95  ?08/26/20 129/81  ?  ? ?Physical Exam ?Vitals and nursing note reviewed.  ?Constitutional:   ?   Appearance: Normal appearance. She is normal weight. She is not toxic-appearing.  ?HENT:  ?   Head: Normocephalic and atraumatic.  ?   Right Ear: Ear canal and external ear normal. A middle ear effusion is present. Tympanic membrane is  not erythematous.  ?   Left Ear: Tympanic membrane, ear canal and external ear normal.  ?   Nose: Nose normal.  ?   Mouth/Throat:  ?   Mouth: Mucous membranes are moist.  ?Eyes:  ?   Extraocular Movements: Extraocular movements intact.  ?   Conjunctiva/sclera: Conjunctivae normal.  ?   Pupils: Pupils are equal, round, and reactive to light.  ?Cardiovascular:  ?   Rate and Rhythm: Normal rate and regular rhythm.  ?   Pulses: Normal pulses.  ?   Heart sounds: Normal heart sounds.  ?Pulmonary:  ?   Effort: Pulmonary effort is normal.  ?   Breath sounds: Normal breath sounds.  ?Abdominal:  ?   General: Abdomen is flat. Bowel sounds are normal.  ?   Palpations: Abdomen is soft.  ?Musculoskeletal:     ?   General: Normal range of motion.  ?   Cervical back: Normal range of motion and neck supple.  ?   Right lower leg: Edema present.  ?   Left lower leg: Edema present.  ?Skin: ?   General: Skin is warm and dry.  ?Neurological:  ?   General: No focal deficit present.  ?   Mental Status: She is alert and oriented to person, place, and time.  ?Psychiatric:     ?   Mood and Affect: Mood normal.     ?   Behavior: Behavior normal.     ?   Thought Content: Thought content normal.     ?   Judgment: Judgment normal.  ? ? ?   ?Assessment & Plan:  ? ?Problem List Items Addressed This Visit   ? ?  ? Musculoskeletal and Integument  ? Rheumatoid arthritis ([place])  ? Relevant Orders  ? CBC with Differential/Platelet (Completed)  ? Comprehensive metabolic panel (Completed)  ? Hemoglobin A1c (Completed)  ? Uric acid (Completed)  ?  ? Other  ? Prediabetes  ? Relevant Orders  ? Comprehensive metabolic panel (Completed)  ? Hemoglobin A1c (Completed)  ? Vitamin D deficiency  ? Relevant Orders  ? Vitamin D (25 hydroxy) (Completed)  ? ?Other Visit Diagnoses   ? ? OSA (obstructive sleep apnea)    -  Primary  ? Relevant Orders  ? Ambulatory referral to Sleep Studies  ? Polyarthralgia      ?  Relevant Orders  ? Uric acid (Completed)  ? Acute  dysfunction of right eustachian tube      ? Bilateral leg edema      ? Relevant Orders  ? CBC with Differential/Platelet (Completed)  ? Comprehensive metabolic panel (Completed)  ? Uric acid (Completed)  ? Elevated blood pressure reading      ? Relevant Orders  ? Comprehensive metabolic panel (Completed)  ? Hemoglobin A1c (Completed)  ? ?  ? ? ?1. OSA (obstructive sleep apnea) ?History indicates likely OSA. Referral for sleep study. Sleep hygiene discussed.  ? ?2. Prediabetes ?3. Vitamin D deficiency ?Recheck labs today, treat accordingly. ?Lifestyle changes advised. ? ?4. Rheumatoid arthritis, involving unspecified site, unspecified whether rheumatoid factor present ([place]) ?5. Polyarthralgia ?Pt to cont regul f/up with rheum. Currently on Methotrexate and Enbrel. ? ?6. Acute dysfunction of right eustachian tube ?Xyzal or Claritin + Flonase for a few weeks to help with right eustachian tube. ?ENT if worse or no better.  ? ?7. Bilateral leg edema ?Limit salt, drink less soda and needs more water. ?Compression stockings. ?Labs today ? ?8. Elevated blood pressure reading ?Elevated BP, needs to monitor at home and bring log to next appt.  ? ? ?F/up 4 weeks for BP and med recheck or prn  ? ?Duvan Mousel M Jerah Esty, PA-C ?

## 2021-03-31 NOTE — Patient Instructions (Addendum)
Good to meet you today! ?Please go to the lab for blood work and I will send results through MyChart. ?64 oz water daily is goal. Try to limit salt and sodas in diet. ?Try compression stockings. ?Monitor BP at home. Bring log to next appt.  ? ?Sleep study referral placed.  ? ?Xyzal or Claritin + Flonase for a few weeks to help with right eustachian tube. ?ENT if worse or no better.  ?

## 2021-04-03 ENCOUNTER — Other Ambulatory Visit: Payer: Self-pay

## 2021-04-03 MED ORDER — VITAMIN D (ERGOCALCIFEROL) 1.25 MG (50000 UNIT) PO CAPS: 50000.0000 [IU] | ORAL_CAPSULE | ORAL | 0 refills | Status: DC

## 2021-06-02 ENCOUNTER — Encounter: Payer: Self-pay | Admitting: Neurology

## 2021-06-02 ENCOUNTER — Ambulatory Visit (INDEPENDENT_AMBULATORY_CARE_PROVIDER_SITE_OTHER): Payer: Managed Care, Other (non HMO) | Admitting: Neurology

## 2021-06-02 VITALS — BP 114/76 | HR 82 | Ht 63.0 in | Wt 220.5 lb

## 2021-06-02 DIAGNOSIS — M2550 Pain in unspecified joint: Secondary | ICD-10-CM | POA: Diagnosis not present

## 2021-06-02 DIAGNOSIS — G4730 Sleep apnea, unspecified: Secondary | ICD-10-CM | POA: Diagnosis not present

## 2021-06-02 DIAGNOSIS — Z9889 Other specified postprocedural states: Secondary | ICD-10-CM

## 2021-06-02 DIAGNOSIS — J342 Deviated nasal septum: Secondary | ICD-10-CM | POA: Diagnosis not present

## 2021-06-02 DIAGNOSIS — G4701 Insomnia due to medical condition: Secondary | ICD-10-CM

## 2021-06-02 NOTE — Patient Instructions (Addendum)
Insomnia ?Insomnia is a sleep disorder that makes it difficult to fall asleep or stay asleep. Insomnia can cause fatigue, low energy, difficulty concentrating, mood swings, and poor performance at work or school. ?There are three different ways to classify insomnia: ?Difficulty falling asleep. ?Difficulty staying asleep. ?Waking up too early in the morning. ?Any type of insomnia can be long-term (chronic) or short-term (acute). Both are common. Short-term insomnia usually lasts for 3 months or less. Chronic insomnia occurs at least three times a week for longer than 3 months. ?What are the causes? ?Insomnia may be caused by another condition, situation, or substance, such as: ?Having certain mental health conditions, such as anxiety and depression. ?Using caffeine, alcohol, tobacco, or drugs. ?Having gastrointestinal conditions, such as gastroesophageal reflux disease (GERD). ?Having certain medical conditions. These include: ?Asthma. ?Alzheimer's disease. ?Stroke. ?Chronic pain. ?An overactive thyroid gland (hyperthyroidism). ?Other sleep disorders, such as restless legs syndrome and sleep apnea. ?Menopause. ?Sometimes, the cause of insomnia may not be known. ?What increases the risk? ?Risk factors for insomnia include: ?Gender. Females are affected more often than males. ?Age. Insomnia is more common as people get older. ?Stress and certain medical and mental health conditions. ?Lack of exercise. ?Having an irregular work schedule. This may include working night shifts and traveling between different time zones. ?What are the signs or symptoms? ?If you have insomnia, the main symptom is having trouble falling asleep or having trouble staying asleep. This may lead to other symptoms, such as: ?Feeling tired or having low energy. ?Feeling nervous about going to sleep. ?Not feeling rested in the morning. ?Having trouble concentrating. ?Feeling irritable, anxious, or depressed. ?How is this diagnosed? ?This condition  may be diagnosed based on: ?Your symptoms and medical history. Your health care provider may ask about: ?Your sleep habits. ?Any medical conditions you have. ?Your mental health. ?A physical exam. ?How is this treated? ?Treatment for insomnia depends on the cause. Treatment may focus on treating an underlying condition that is causing the insomnia. Treatment may also include: ?Medicines to help you sleep. ?Counseling or therapy. ?Lifestyle adjustments to help you sleep better. ?Follow these instructions at home: ?Eating and drinking ? ?Limit or avoid alcohol, caffeinated beverages, and products that contain nicotine and tobacco, especially close to bedtime. These can disrupt your sleep. ?Do not eat a large meal or eat spicy foods right before bedtime. This can lead to digestive discomfort that can make it hard for you to sleep. ?Sleep habits ? ?Keep a sleep diary to help you and your health care provider figure out what could be causing your insomnia. Write down: ?When you sleep. ?When you wake up during the night. ?How well you sleep and how rested you feel the next day. ?Any side effects of medicines you are taking. ?What you eat and drink. ?Make your bedroom a dark, comfortable place where it is easy to fall asleep. ?Put up shades or blackout curtains to block light from outside. ?Use a white noise machine to block noise. ?Keep the temperature cool. ?Limit screen use before bedtime. This includes: ?Not watching TV. ?Not using your smartphone, tablet, or computer. ?Stick to a routine that includes going to bed and waking up at the same times every day and night. This can help you fall asleep faster. Consider making a quiet activity, such as reading, part of your nighttime routine. ?Try to avoid taking naps during the day so that you sleep better at night. ?Get out of bed if you are still awake after  15 minutes of trying to sleep. Keep the lights down, but try reading or doing a quiet activity. When you feel  sleepy, go back to bed. ?General instructions ?Take over-the-counter and prescription medicines only as told by your health care provider. ?Exercise regularly as told by your health care provider. However, avoid exercising in the hours right before bedtime. ?Use relaxation techniques to manage stress. Ask your health care provider to suggest some techniques that may work well for you. These may include: ?Breathing exercises. ?Routines to release muscle tension. ?Visualizing peaceful scenes. ?Make sure that you drive carefully. Do not drive if you feel very sleepy. ?Keep all follow-up visits. This is important. ?Contact a health care provider if: ?You are tired throughout the day. ?You have trouble in your daily routine due to sleepiness. ?You continue to have sleep problems, or your sleep problems get worse. ?Get help right away if: ?You have thoughts about hurting yourself or someone else. ?Get help right away if you feel like you may hurt yourself or others, or have thoughts about taking your own life. Go to your nearest emergency room or: ?Call 911. ?Call the National Suicide Prevention Lifeline at 619-179-7220 or 988. This is open 24 hours a day. ?Text the Crisis Text Line at 6064950023. ?Summary ?Insomnia is a sleep disorder that makes it difficult to fall asleep or stay asleep. ?Insomnia can be long-term (chronic) or short-term (acute). ?Treatment for insomnia depends on the cause. Treatment may focus on treating an underlying condition that is causing the insomnia. ?Keep a sleep diary to help you and your health care provider figure out what could be causing your insomnia. ?This information is not intended to replace advice given to you by your health care provider. Make sure you discuss any questions you have with your health care provider. ?Document Revised: 12/15/2020 Document Reviewed: 12/15/2020 ?Elsevier Patient Education ? 2023 Elsevier Inc. ?Quality Sleep Information, Adult ?Quality sleep is important  for your mental and physical health. It also improves your quality of life. Quality sleep means you: ?Are asleep for most of the time you are in bed. ?Fall asleep within 30 minutes. ?Wake up no more than once a night.  ?Are awake for no longer than 20 minutes if you do wake up during the night. ?Most adults need 7-8 hours of quality sleep each night. ?How can poor sleep affect me? ?If you do not get enough quality sleep, you may have: ?Mood swings. ?Daytime sleepiness. ?Confusion. ?Decreased reaction time. ?Sleep disorders, such as insomnia and sleep apnea. ?Difficulty with: ?Solving problems. ?Coping with stress. ?Paying attention. ?These issues may affect your performance and productivity at work, school, and at home. Lack of sleep may also put you at higher risk for accidents, suicide, and risky behaviors. ?If you do not get quality sleep you may also be at higher risk for several health problems, including: ?Infections. ?Type 2 diabetes. ?Heart disease. ?High blood pressure. ?Obesity. ?Worsening of long-term conditions, like arthritis, kidney disease, depression, Parkinson's disease, and epilepsy. ?What actions can I take to get more quality sleep? ? ?  ? ?Stick to a sleep schedule. Go to sleep and wake up at about the same time each day. Do not try to sleep less on weekdays and make up for lost sleep on weekends. This does not work. ?Try to get about 30 minutes of exercise on most days. Do not exercise 2-3 hours before going to bed. ?Limit naps during the day to 30 minutes or less. ?Do not use  any products that contain nicotine or tobacco, such as cigarettes or e-cigarettes. If you need help quitting, ask your health care provider. ?Do not drink caffeinated beverages for at least 8 hours before going to bed. Coffee, tea, and some sodas contain caffeine. ?Do not drink alcohol close to bedtime. ?Do not eat large meals close to bedtime. ?Do not take naps in the late afternoon. ?Try to get at least 30 minutes of  sunlight every day. Morning sunlight is best. ?Make time to relax before bed. Reading, listening to music, or taking a hot bath promotes quality sleep. ?Make your bedroom a place that promotes quality sleep. Ke

## 2021-06-02 NOTE — Progress Notes (Signed)
? ? ?SLEEP MEDICINE CLINIC ?  ? ?Provider:  Larey Seat, MD  ?Primary Care Physician:  Allwardt, Randa Evens, PA-C ?[place] [address]  ? ?  ?Referring Provider: Allwardt, Randa Evens, Pa-c ?[place],  [place] [address]  ?  ?  ?    ?Chief Complaint according to patient   ?Patient presents with:  ?  ? New Patient (Initial Visit)  ?   Rm 11, alone. Pt referred for loud snoring, witnessed sleep apnea, hx of uvula removal and tonsillectomy, and fatigue. Pt reports having chronic Insomnia-  trouble falling asleep and staying asleep. Last SS done 5 years ago and dx with OSA. Underwent UPPP , which only helped  6-12 months. Sleeping 4.5-5 hrs of sleep on average. Has rheumatoid/ psoriatric arthritis, followed by dr Amil Amen.  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   ?  ?  ?HISTORY OF PRESENT ILLNESS:  ? ?06-02-2021 ?Olivia Sims is a 51 y.o. Caucasian female patient seen here upon referral on 06/02/2021   for a Sleep evaluation.  ?Chief concern according to patient :  I take my lunchtime nap in my car-  getting 2 hours of energy from a 20-30 minutes nap. Insomnia related to racing mind, and pain.  ?  ? Olivia Sims  has a past medical history of Allergy, Complication of anesthesia, Depression, Elevated LDL cholesterol level, Endometrial polyp (2021), Exercise-induced asthma, History of kidney stones, Kidney stones, Pre-diabetes, Prediabetes, Rheumatoid arthritis ([place]), Right ovarian cyst (2021), Sleep apnea, Urinary incontinence, and Vitamin D deficiency. ?  ?The patient had the first sleep study in the year 2018 at [facility]- Staying overnight-   with a result of an AHI ( Apnea Hypopnea index) of mild apnea per patient's report. ?  ?Sleep relevant medical history: Tonsillectomy-yes, UPPP yes .chronic pain.  ? ?Family medical /sleep history: father with OSA, had hypersomnia.Obese.  ?  ?Social history:  Patient is working at the [employer], Freight forwarder- ? and lives in a household with her sister.  One dog and one cat.  ?The patient currently works fluctuating hours , latest until 10 Pm, earliest at 51 AM.  ?Tobacco use; quit in 2002.  ?ETOH use ; 2-3 week, Caffeine intake in form of Coffee( /) Soda( 2-3 day ) Tea ( ) or energy drinks. ?Regular exercise - none.  ? ?  ?Sleep habits are as follows: The patient's dinner time is between variable  PM.  ?The patient goes to bed at 9- midnight PM and continues to sleep for 4-6 hours, wakes up 2-3 times, the first time at 3 AM.   ?The preferred sleep position is supine, with the support of 2-3 pillows. GERD related.  ?Dreams are reportedly rare/ was on Cymbalta and had vivid dreams then.   ?4.30- 7  AM is the usual rise time.  ?The patient wakes up with an alarm.  ?He reports not feeling refreshed or restored in AM, with symptoms such as dry mouth, morning headaches,choking, , and residual fatigue.  ?Naps are taken frequently, lasting from 15 to 30 minutes and are refreshing.  ?  ?Review of Systems: ?Out of a complete 14 system review, the patient complains of only the following symptoms, and all other reviewed systems are negative.:  ?Fatigue, sleepiness , snoring, fragmented sleep, Insomnia  ?  ?How likely are you to doze in the following situations: ?0 = not likely, 1 =  slight chance, 2 = moderate chance, 3 = high chance ?  ?Sitting and Reading? ?Watching Television? ?Sitting inactive in a public place (theater or meeting)? ?As a passenger in a car for an hour without a break? ?Lying down in the afternoon when circumstances permit? ?Sitting and talking to someone? ?Sitting quietly after lunch without alcohol? ?In a car, while stopped for a few minutes in traffic? ?  ?Total = 14/ 24 points  ? FSS endorsed at 43/ 63 points.  ? ?Social History  ? ?Socioeconomic History  ? Marital status: Single  ?  Spouse name: Not on file  ? Number of children: 0  ? Years of education: 38  ? Highest education level: Not on  file  ?Occupational History  ? Not on file  ?Tobacco Use  ? Smoking status: Former  ?  Packs/day: 1.00  ?  Years: 16.00  ?  Pack years: 16.00  ?  Types: Cigarettes  ?  Quit date: 2002  ?  Years since quitting: 21.3  ? Smokeless tobacco: Never  ?Vaping Use  ? Vaping Use: Never used  ?Substance and Sexual Activity  ? Alcohol use: Not Currently  ?  Alcohol/week: 2.0 standard drinks  ?  Types: 1 Glasses of wine, 1 Cans of beer per week  ?  Comment: Occ  ? Drug use: Never  ? Sexual activity: Not Currently  ?  Birth control/protection: Abstinence  ?Other Topics Concern  ? Not on file  ?Social History Narrative  ? Lives with sister  ? R handed  ? Caffeine: 2-3 drinks a day  ? ?Social Determinants of Health  ? ?Financial Resource Strain: Not on file  ?Food Insecurity: Not on file  ?Transportation Needs: Not on file  ?Physical Activity: Not on file  ?Stress: Not on file  ?Social Connections: Not on file  ? ? ?Family History  ?Problem Relation Age of Onset  ? Diabetes Mother   ? Hypothyroidism Mother   ? Heart Problems Mother   ?     pacemaker   ? Cancer Mother   ?     endometrial ca?  ? Thyroid disease Mother   ? Hypertension Father   ? Diabetes Father   ? Non-Hodgkin's lymphoma Paternal Grandmother   ? ? ?Past Medical History:  ?Diagnosis Date  ? Allergy   ? Complication of anesthesia   ? bladder " froze' years ago per pt   ? Depression   ? Elevated LDL cholesterol level   ? Endometrial polyp 2021  ? Exercise-induced asthma   ? pt denies at 4/20/visit   ? History of kidney stones   ? Kidney stones   ? Pre-diabetes   ? Prediabetes   ? Rheumatoid arthritis ([place])   ? Right ovarian cyst 2021  ? Sleep apnea   ? hx of had surgery to correct   ? Urinary incontinence   ? Vitamin D deficiency   ? ? ?Past Surgical History:  ?Procedure Laterality Date  ? CYSTOSCOPY N/A 05/14/2019  ? Procedure: CYSTOSCOPY;  Surgeon: Nunzio Cobbs, MD;  Location: [facility];  Service: Gynecology;  Laterality: N/A;  ?  GALLBLADDER SURGERY    ? KIDNEY STONE SURGERY    ? OVARIAN CYST REMOVAL Right 05/14/2019  ? Procedure: Excision RIGHT Para Tubal CYST;  Surgeon: Nunzio Cobbs, MD;  Location: [facility];  Service: Gynecology;  Laterality: Right;  right adnexal cyst removal  ? PALATE / UVULA BIOPSY /  EXCISION    ? shoulder surgery     ? TONSILLECTOMY    ? TOTAL LAPAROSCOPIC HYSTERECTOMY WITH SALPINGECTOMY N/A 05/14/2019  ? Procedure: TOTAL LAPAROSCOPIC HYSTERECTOMY WITH SALPINGECTOMY, Pelvic Washings;  Surgeon: Nunzio Cobbs, MD;  Location: [facility];  Service: Gynecology;  Laterality: N/A;  ?  ? ?Current Outpatient Medications on File Prior to Visit  ?Medication Sig Dispense Refill  ? albuterol (VENTOLIN HFA) 108 (90 Base) MCG/ACT inhaler Inhale 2 puffs into the lungs 4 (four) times daily as needed.    ? buPROPion (WELLBUTRIN XL) 150 MG 24 hr tablet TAKE 1 TABLET BY MOUTH EVERY MORNING 90 tablet 1  ? ENBREL SURECLICK 50 MG/ML injection Inject into the skin once a week.    ? fexofenadine (ALLEGRA) 180 MG tablet Take 180 mg by mouth daily.    ? folic acid (FOLVITE) 1 MG tablet Take 1 mg by mouth daily.    ? gabapentin (NEURONTIN) 300 MG capsule 1 PO q HS, may increase to 1 PO BID if needed 60 capsule 3  ? ibuprofen (ADVIL) 800 MG tablet Take 1 tablet (800 mg total) by mouth every 8 (eight) hours as needed for mild pain or moderate pain. 30 tablet 0  ? Methotrexate Sodium (METHOTREXATE, PF,) 50 MG/2ML injection     ? oxyCODONE-acetaminophen (PERCOCET/ROXICET) 5-325 MG tablet Take 1 tablet by mouth 3 (three) times daily as needed.    ? Vitamin D, Ergocalciferol, (DRISDOL) 1.25 MG (50000 UNIT) CAPS capsule Take 1 capsule (50,000 Units total) by mouth every 7 (seven) days for 12 doses. 12 capsule 0  ? ?No current facility-administered medications on file prior to visit.  ? ? ?No Known Allergies ? ?Physical exam: ? ?Today's Vitals  ? 06/02/21 1000  ?BP: 114/76  ?Pulse: 82  ?Weight: 220  lb 8 oz (100 kg)  ?Height: 5\' 3"  (1.6 m)  ? ?Body mass index is 39.06 kg/m?.  ? ?Wt Readings from Last 3 Encounters:  ?06/02/21 220 lb 8 oz (100 kg)  ?03/31/21 218 lb 4 oz (99 kg)  ?12/18/20 208 lb (94.3 kg)

## 2021-07-09 ENCOUNTER — Other Ambulatory Visit: Payer: Self-pay | Admitting: Physician Assistant

## 2021-07-09 ENCOUNTER — Telehealth: Payer: Self-pay | Admitting: Neurology

## 2021-07-09 NOTE — Telephone Encounter (Signed)
07/02/21 [employer] no auth req spoke to [facility] ref # [id number] left VM 07/09/21 [place]

## 2021-07-14 NOTE — Telephone Encounter (Signed)
LVM for pt to call back to schedule.

## 2021-08-05 NOTE — Telephone Encounter (Signed)
We have attempted to call the patient two times to schedule sleep study.  Patient has been unavailable at the phone numbers we have on file and has not returned our calls. If patient calls back we will schedule them for their sleep study.  

## 2021-08-17 ENCOUNTER — Other Ambulatory Visit (HOSPITAL_COMMUNITY): Payer: Self-pay

## 2021-08-17 MED ORDER — METHOTREXATE SODIUM CHEMO INJECTION 50 MG/2ML
INTRAMUSCULAR | 2 refills | Status: AC
  Filled 2021-08-17: qty 8, 28d supply, fill #0
  Filled 2022-02-12 – 2022-05-19 (×3): qty 8, 28d supply, fill #1

## 2021-08-18 ENCOUNTER — Other Ambulatory Visit (HOSPITAL_COMMUNITY): Payer: Self-pay

## 2021-08-22 ENCOUNTER — Encounter (HOSPITAL_BASED_OUTPATIENT_CLINIC_OR_DEPARTMENT_OTHER): Payer: Self-pay | Admitting: Emergency Medicine

## 2021-08-22 ENCOUNTER — Other Ambulatory Visit: Payer: Self-pay

## 2021-08-22 ENCOUNTER — Emergency Department (HOSPITAL_BASED_OUTPATIENT_CLINIC_OR_DEPARTMENT_OTHER)
Admission: EM | Admit: 2021-08-22 | Discharge: 2021-08-22 | Disposition: A | Discharge status: Home / Self Care | Payer: Managed Care, Other (non HMO) | Attending: Emergency Medicine | Admitting: Emergency Medicine

## 2021-08-22 DIAGNOSIS — S060X0A Concussion without loss of consciousness, initial encounter: Secondary | ICD-10-CM | POA: Diagnosis not present

## 2021-08-22 DIAGNOSIS — S0990XA Unspecified injury of head, initial encounter: Secondary | ICD-10-CM | POA: Diagnosis present

## 2021-08-22 DIAGNOSIS — Y990 Civilian activity done for income or pay: Secondary | ICD-10-CM | POA: Insufficient documentation

## 2021-08-22 DIAGNOSIS — W228XXA Striking against or struck by other objects, initial encounter: Secondary | ICD-10-CM | POA: Insufficient documentation

## 2021-08-22 NOTE — ED Provider Notes (Signed)
[facility] EMERGENCY DEPT Provider Note   CSN: [id number] Arrival date & time: 08/22/21  1526     History  Chief Complaint  Patient presents with   Head Injury    Olivia Sims is a 51 y.o. female.  51 year old female presents with complaint of headache and dizziness.  Patient states that she was at work yesterday and when she stood up she hit the crown of her head on the hinge for the largest safe above her.  Denies loss of consciousness, is not anticoagulated.  Patient notes having a little bit of a headache and feeling a little dizzy yesterday, went home and her symptoms resolved.  Patient returned to work today and had return of headache and dizziness and recalled hitting her head, reported to her provider who advised her to come to the ER for evaluation.  She denies vomiting or any other injuries, complaints, concerns.  No prior head injury.       Home Medications Prior to Admission medications   Medication Sig Start Date End Date Taking? Authorizing Provider  albuterol (VENTOLIN HFA) 108 (90 Base) MCG/ACT inhaler Inhale 2 puffs into the lungs 4 (four) times daily as needed. 11/28/18   [provider]  buPROPion (WELLBUTRIN XL) 150 MG 24 hr tablet TAKE 1 TABLET BY MOUTH EVERY MORNING 08/20/19   Mozingo, Thereasa Solo, NP  ENBREL SURECLICK 50 MG/ML injection Inject into the skin once a week. 07/12/19   [provider]  fexofenadine (ALLEGRA) 180 MG tablet Take 180 mg by mouth daily.    [provider]  folic acid (FOLVITE) 1 MG tablet Take 1 mg by mouth daily.    [provider]  gabapentin (NEURONTIN) 300 MG capsule 1 PO q HS, may increase to 1 PO BID if needed 08/26/20   Hilts, Michael, MD  ibuprofen (ADVIL) 800 MG tablet Take 1 tablet (800 mg total) by mouth every 8 (eight) hours as needed for mild pain or moderate pain. 05/14/19   Patton Salles, MD  methotrexate 50 MG/2ML injection Inject 0.32ml into the skin once  weekly on the same day of each week (single use vials) 08/17/21     Methotrexate Sodium (METHOTREXATE, PF,) 50 MG/2ML injection  05/13/19   [provider]  oxyCODONE-acetaminophen (PERCOCET/ROXICET) 5-325 MG tablet Take 1 tablet by mouth 3 (three) times daily as needed. 06/01/21   [provider]  Vitamin D, Ergocalciferol, (DRISDOL) 1.25 MG (50000 UNIT) CAPS capsule TAKE 1 CAPSULE (50,000 UNITS TOTAL) BY MOUTH EVERY 7 (SEVEN) DAYS FOR 12 DOSES. 07/09/21 09/25/21  Allwardt, Crist Infante, PA-C      Allergies    Patient has no known allergies.    Review of Systems   Review of Systems Negative except as per HPI Physical Exam Updated Vital Signs BP 131/79 (BP Location: Right Arm)   Pulse 78   Temp 98.1 F (36.7 C) (Oral)   Resp 14   Ht 5\' 3"  (1.6 m)   Wt 99.8 kg   LMP 04/19/2019   SpO2 100%   BMI 38.97 kg/m  Physical Exam Vitals and nursing note reviewed.  Constitutional:      General: She is not in acute distress.    Appearance: She is well-developed. She is not diaphoretic.  HENT:     Head: Normocephalic and atraumatic.      Right Ear: Tympanic membrane and ear canal normal.     Left Ear: Tympanic membrane and ear canal normal.  Nose: Nose normal.     Mouth/Throat:     Mouth: Mucous membranes are moist.  Eyes:     Extraocular Movements: Extraocular movements intact.     Pupils: Pupils are equal, round, and reactive to light.  Pulmonary:     Effort: Pulmonary effort is normal.  Musculoskeletal:     Cervical back: Normal range of motion and neck supple. No tenderness.  Skin:    General: Skin is warm and dry.     Findings: No bruising, erythema or rash.  Neurological:     Mental Status: She is alert and oriented to person, place, and time.     Cranial Nerves: No cranial nerve deficit.     Sensory: No sensory deficit.     Motor: No weakness.     Coordination: Coordination normal.     Gait: Gait normal.  Psychiatric:        Behavior: Behavior normal.      ED Results / Procedures / Treatments   Labs (all labs ordered are listed, but only abnormal results are displayed) Labs Reviewed - No data to display  EKG None  Radiology No results found.  Procedures Procedures    Medications Ordered in ED Medications - No data to display  ED Course/ Medical Decision Making/ A&P                           Medical Decision Making  51 year old female presents for evaluation after hitting her head at work yesterday.  She reports headache and slight dizziness, headache improved with taking Motrin prior to arrival today.  Her exam is reassuring, she does have slight tenderness to the mid parietal area, skin is intact without ecchymosis or crepitus.  Offered CT head however with reassuring neuro exam, suspect concussion and do not feel head CT is necessary, patient agreeable with declining head CT at this time.  She is referred to concussion clinic for follow-up or to follow-up with her Worker's Comp. provider with return to ER precautions.        Final Clinical Impression(s) / ED Diagnoses Final diagnoses:  Concussion without loss of consciousness, initial encounter    Rx / DC Orders ED Discharge Orders     None         Jeannie Fend, PA-C 08/22/21 2018    Sloan Leiter, DO 08/27/21 2313

## 2021-08-22 NOTE — ED Triage Notes (Signed)
Pt via pov from home with head injury yesterday. She stood up and hit her head on a door. She denies LOC but states she did feel a little dizzy. She reports that she has a headache and feels dizzy today. Pt alert & oriented, nad noted.

## 2021-08-22 NOTE — Discharge Instructions (Addendum)
Recommend follow-up with your Worker's Comp. provider or concussion clinic.  Can continue Motrin and Tylenol as needed as directed for headache.  Limit screen time use including TV, computer, cell phone.  Return to ER for worsening or concerning symptoms.

## 2021-09-01 ENCOUNTER — Ambulatory Visit (INDEPENDENT_AMBULATORY_CARE_PROVIDER_SITE_OTHER): Payer: Managed Care, Other (non HMO) | Admitting: Family Medicine

## 2021-09-01 VITALS — BP 116/82 | HR 89 | Ht 63.0 in | Wt 224.0 lb

## 2021-09-01 DIAGNOSIS — G4489 Other headache syndrome: Secondary | ICD-10-CM

## 2021-09-01 DIAGNOSIS — R413 Other amnesia: Secondary | ICD-10-CM | POA: Diagnosis not present

## 2021-09-01 DIAGNOSIS — S060X0A Concussion without loss of consciousness, initial encounter: Secondary | ICD-10-CM | POA: Diagnosis not present

## 2021-09-01 MED ORDER — NORTRIPTYLINE HCL 25 MG PO CAPS: 25.0000 mg | ORAL_CAPSULE | Freq: Every day | ORAL | 2 refills | Status: DC

## 2021-09-01 NOTE — Patient Instructions (Signed)
Thank you for coming in today.   Nortryptline at bedtime as needed for headache.   We can add speech therapy cognitive rehab if needed.   Recheck in 1 month. However if you are better ok to cancel.   Please let me know if this is not working.

## 2021-09-01 NOTE — Progress Notes (Signed)
Subjective:   I, Olivia Sims, LAT, ATC acting as a scribe for Olivia Graham, MD.  Chief Complaint: Olivia Sims,  is a 51 y.o. female who presents for initial evaluation of a head injury. While at work at [employer], pt stood up and hit her head on the hinge of a large safe. No LOC. Pt was seen at the [facility] ED on 08/22/21 c/o HA and dizziness. Today, pt c/o HA, memory loss, and "fuzziness."  Injury date: 08/21/21 Visit #: 1  History of Present Illness:   Concussion Self-Reported Symptom Score Symptoms rated on a scale 1-6, in last 24 hours   Headache: 3    Nausea: 0  Dizziness: 1  Vomiting: 0  Balance Difficulty: 1   Trouble Falling Asleep: 0   Fatigue: 0  Sleep Less Than Usual: 0  Daytime Drowsiness: 0  Sleep More Than Usual: 0  Photophobia: 2  Phonophobia: 1  Irritability: 0  Sadness: 0  Numbness or Tingling: 0  Nervousness: 0  Feeling More Emotional: 0  Feeling Mentally Foggy: 3  Feeling Slowed Down: 1  Memory Problems: 3  Difficulty Concentrating: 2  Visual Problems: 0  Total # of Symptoms: 9/22 Total Symptom Score: 17/132  Neck Pain: No Tinnitus: No  Review of Systems: No fevers or chills    Review of History: Positive for rheumatoid arthritis.  Objective:    Physical Examination Vitals:   09/01/21 1519  BP: 116/82  Pulse: 89  SpO2: 96%   MSK: C-spine: Normal cervical motion Neuro: Alert and oriented normal coordination and gait. Psych: Normal speech thought process and affect    Assessment and Plan   51 y.o. female with [place] occurring about 10 days ago.  Symptoms are already improving.  Dominant symptoms are headache and cognition.  For headache we will try nortriptyline at bedtime.    For cognition we will proceed with watchful waiting for now.  Can add cognitive rehab with speech therapy if needed. Fortunately she is already returned to work which is reassuring.    Recheck in 1 month.  Okay to return or contact me sooner if  needed.    Action/Discussion: Reviewed diagnosis, management options, expected outcomes, and the reasons for scheduled and emergent follow-up. Questions were adequately answered. Patient expressed verbal understanding and agreement with the following plan.     Patient Education: Reviewed with patient the risks (i.e, a repeat concussion, post-concussion syndrome, second-impact syndrome) of returning to play prior to complete resolution, and thoroughly reviewed the signs and symptoms of concussion.Reviewed need for complete resolution of all symptoms, with rest AND exertion, prior to return to play. Reviewed red flags for urgent medical evaluation: worsening symptoms, nausea/vomiting, intractable headache, musculoskeletal changes, focal neurological deficits. [facility] Concussion Care Plan, which clearly outlines the plans stated above, was given to patient.   Level of service: Total encounter time 30 minutes including face-to-face time with the patient and, reviewing past medical record, and charting on the date of service.        After Visit Summary printed out and provided to patient as appropriate.  The above documentation has been reviewed and is accurate and complete Olivia Sims

## 2021-09-08 ENCOUNTER — Other Ambulatory Visit (HOSPITAL_BASED_OUTPATIENT_CLINIC_OR_DEPARTMENT_OTHER): Payer: Self-pay

## 2021-09-08 MED ORDER — OXYCODONE-ACETAMINOPHEN 5-325 MG PO TABS
ORAL_TABLET | ORAL | 0 refills | Status: AC
  Filled 2021-09-08: qty 120, 30d supply, fill #0

## 2021-09-24 ENCOUNTER — Other Ambulatory Visit: Payer: Self-pay | Admitting: Family Medicine

## 2021-09-24 NOTE — Telephone Encounter (Signed)
Rx refill request approved per Dr. Corey's orders. 

## 2021-10-06 ENCOUNTER — Ambulatory Visit: Payer: Managed Care, Other (non HMO) | Admitting: Family Medicine

## 2021-10-08 ENCOUNTER — Other Ambulatory Visit (HOSPITAL_BASED_OUTPATIENT_CLINIC_OR_DEPARTMENT_OTHER): Payer: Self-pay

## 2021-10-09 ENCOUNTER — Other Ambulatory Visit (HOSPITAL_BASED_OUTPATIENT_CLINIC_OR_DEPARTMENT_OTHER): Payer: Self-pay

## 2021-10-12 ENCOUNTER — Encounter: Payer: Self-pay | Admitting: *Deleted

## 2021-10-22 ENCOUNTER — Encounter: Payer: Managed Care, Other (non HMO) | Admitting: Physician Assistant

## 2021-11-19 ENCOUNTER — Encounter: Payer: Self-pay | Admitting: Physician Assistant

## 2021-11-19 ENCOUNTER — Ambulatory Visit: Payer: Managed Care, Other (non HMO) | Admitting: Physician Assistant

## 2021-11-19 VITALS — BP 130/86 | HR 82 | Temp 97.8°F | Ht 63.0 in | Wt 222.6 lb

## 2021-11-19 DIAGNOSIS — Z23 Encounter for immunization: Secondary | ICD-10-CM | POA: Diagnosis not present

## 2021-11-19 DIAGNOSIS — E559 Vitamin D deficiency, unspecified: Secondary | ICD-10-CM | POA: Diagnosis not present

## 2021-11-19 DIAGNOSIS — L821 Other seborrheic keratosis: Secondary | ICD-10-CM

## 2021-11-19 DIAGNOSIS — Z13220 Encounter for screening for lipoid disorders: Secondary | ICD-10-CM

## 2021-11-19 DIAGNOSIS — F331 Major depressive disorder, recurrent, moderate: Secondary | ICD-10-CM

## 2021-11-19 DIAGNOSIS — R051 Acute cough: Secondary | ICD-10-CM

## 2021-11-19 DIAGNOSIS — R600 Localized edema: Secondary | ICD-10-CM

## 2021-11-19 DIAGNOSIS — F411 Generalized anxiety disorder: Secondary | ICD-10-CM | POA: Diagnosis not present

## 2021-11-19 DIAGNOSIS — Z0000 Encounter for general adult medical examination without abnormal findings: Secondary | ICD-10-CM | POA: Diagnosis not present

## 2021-11-19 DIAGNOSIS — Z131 Encounter for screening for diabetes mellitus: Secondary | ICD-10-CM

## 2021-11-19 DIAGNOSIS — Z1211 Encounter for screening for malignant neoplasm of colon: Secondary | ICD-10-CM | POA: Diagnosis not present

## 2021-11-19 MED ORDER — FUROSEMIDE 20 MG PO TABS: 20.0000 mg | ORAL_TABLET | Freq: Every day | ORAL | 0 refills | Status: DC

## 2021-11-19 MED ORDER — BENZONATATE 100 MG PO CAPS: 100.0000 mg | ORAL_CAPSULE | Freq: Three times a day (TID) | ORAL | 0 refills | Status: DC | PRN

## 2021-11-19 MED ORDER — BUPROPION HCL ER (XL) 150 MG PO TB24: ORAL_TABLET | ORAL | 1 refills | Status: AC

## 2021-11-19 NOTE — Progress Notes (Signed)
Subjective:    Patient ID: Olivia Sims, female    DOB: [birth year], 51 y.o.   MRN: [id number]  Chief Complaint  Patient presents with   Annual Exam    Pt in office for annual CPE; pt not fasting for labs, last labs in March 2023, had Covid last week, still has cough interfering with sleep, also c/o LE swelling mostly everyday after standing on her feet daily. Discuss cologuard vs Colonoscopy; pt getting first Shingles vaccine and flu vaccine today    HPI Patient is in today for annual exam. Recently getting over COVID, lingering cough keeping her awake at night.  Acute concerns: Bilateral ankle edema, worse at the end of the day, wears compression stockings daily and this helps some. Hasn't tried any medications for this previously.  A few moles on her back she would like checked out.   Health maintenance: Lifestyle/ exercise: Exercise mostly at work only - on feet most shifts; having a lot of pain with RA, trouble getting Enbrel because of insurance and supply issues Nutrition: Fluctuates, trying to cook more  Mental health: Doing well at this time, stable on Wellbutrin XL 150 mg  Caffeine: A lot of soda - [employer] Sleep: Not that great currently, usually based on pain level  Substance use: Quit smoking 20 years ago Alcohol: Socially Sexual activity: Active, monogamous, no personal hx Immunizations: Flu and first shingles today  Colonoscopy: Cologuard today  Pap: UTD with GYN Mammogram: Sees GYN    Past Medical History:  Diagnosis Date   Allergy    Complication of anesthesia    bladder " froze' years ago per pt    Depression    Elevated LDL cholesterol level    Endometrial polyp 2021   Exercise-induced asthma    pt denies at 4/20/visit    History of kidney stones    Kidney stones    Pre-diabetes    Prediabetes    Rheumatoid arthritis ([place])    Right ovarian cyst 2021   Sleep apnea    hx of had surgery to correct    Urinary incontinence    Vitamin D deficiency      Past Surgical History:  Procedure Laterality Date   CYSTOSCOPY N/A 05/14/2019   Procedure: CYSTOSCOPY;  Surgeon: Nunzio Cobbs, MD;  Location: [facility];  Service: Gynecology;  Laterality: N/A;   GALLBLADDER SURGERY     KIDNEY STONE SURGERY     OVARIAN CYST REMOVAL Right 05/14/2019   Procedure: Excision RIGHT Para Tubal CYST;  Surgeon: Nunzio Cobbs, MD;  Location: [facility];  Service: Gynecology;  Laterality: Right;  right adnexal cyst removal   PALATE / UVULA BIOPSY / EXCISION     shoulder surgery      TONSILLECTOMY     TOTAL LAPAROSCOPIC HYSTERECTOMY WITH SALPINGECTOMY N/A 05/14/2019   Procedure: TOTAL LAPAROSCOPIC HYSTERECTOMY WITH SALPINGECTOMY, Pelvic Washings;  Surgeon: Nunzio Cobbs, MD;  Location: [facility];  Service: Gynecology;  Laterality: N/A;    Family History  Problem Relation Age of Onset   Diabetes Mother    Hypothyroidism Mother    Heart Problems Mother        pacemaker    Cancer Mother        endometrial ca?   Thyroid disease Mother    Hypertension Father    Diabetes Father    Non-Hodgkin's lymphoma Paternal Grandmother     Social History  Tobacco Use   Smoking status: Former    Packs/day: 1.00    Years: 16.00    Total pack years: 16.00    Types: Cigarettes    Quit date: 2002    Years since quitting: 21.8   Smokeless tobacco: Never  Vaping Use   Vaping Use: Never used  Substance Use Topics   Alcohol use: Not Currently    Alcohol/week: 2.0 standard drinks of alcohol    Types: 1 Glasses of wine, 1 Cans of beer per week    Comment: Occ   Drug use: Never     No Known Allergies  Review of Systems NEGATIVE UNLESS OTHERWISE INDICATED IN HPI      Objective:     BP 130/86 (BP Location: Left Arm)   Pulse 82   Temp 97.8 F (36.6 C) (Temporal)   Ht 5\' 3"  (1.6 m)   Wt 222 lb 9.6 oz (101 kg)   LMP 04/19/2019   SpO2 98%   BMI 39.43 kg/m   Wt Readings  from Last 3 Encounters:  11/19/21 222 lb 9.6 oz (101 kg)  09/01/21 224 lb (101.6 kg)  08/22/21 220 lb (99.8 kg)    BP Readings from Last 3 Encounters:  11/19/21 130/86  09/01/21 116/82  08/22/21 130/80     Physical Exam Vitals and nursing note reviewed.  Constitutional:      Appearance: Normal appearance. She is obese. She is not toxic-appearing.  HENT:     Head: Normocephalic and atraumatic.     Right Ear: Tympanic membrane, ear canal and external ear normal.     Left Ear: Tympanic membrane, ear canal and external ear normal.     Nose: Nose normal.     Mouth/Throat:     Mouth: Mucous membranes are moist.  Eyes:     Extraocular Movements: Extraocular movements intact.     Conjunctiva/sclera: Conjunctivae normal.     Pupils: Pupils are equal, round, and reactive to light.  Cardiovascular:     Rate and Rhythm: Normal rate and regular rhythm.     Pulses: Normal pulses.     Heart sounds: Normal heart sounds.  Pulmonary:     Effort: Pulmonary effort is normal.     Breath sounds: Normal breath sounds.  Abdominal:     General: Abdomen is flat. Bowel sounds are normal.     Palpations: Abdomen is soft.  Musculoskeletal:        General: Normal range of motion.     Cervical back: Normal range of motion and neck supple.     Right lower leg: Edema present.     Left lower leg: Edema present.  Skin:    General: Skin is warm and dry.     Findings: Lesion (scattered seborrheic keratoses) present. No rash.  Neurological:     General: No focal deficit present.     Mental Status: She is alert and oriented to person, place, and time.  Psychiatric:        Mood and Affect: Mood normal.        Behavior: Behavior normal.        Thought Content: Thought content normal.        Judgment: Judgment normal.        Assessment & Plan:  Encounter for annual physical exam Assessment & Plan: Age-appropriate screening and counseling performed today. Will check labs and call with results.  Preventive measures discussed and printed in AVS for patient. Cologuard ordered. Flu shot updated. Shingrix updated.  Work on increasing exercise, nutrition habits.   Patient Counseling: [x]   Nutrition: Stressed importance of moderation in sodium/caffeine intake, saturated fat and cholesterol, caloric balance, sufficient intake of fresh fruits, vegetables, and fiber.  [x]   Stressed the importance of regular exercise.   [x]   Substance Abuse: Discussed cessation/primary prevention of tobacco, alcohol, or other drug use; driving or other dangerous activities under the influence; availability of treatment for abuse.   []   Injury prevention: Discussed safety belts, safety helmets, smoke detector, smoking near bedding or upholstery.   [x]   Sexuality: Discussed sexually transmitted diseases, partner selection, use of condoms, avoidance of unintended pregnancy  and contraceptive alternatives.   [x]   Dental health: Discussed importance of regular tooth brushing, flossing, and dental visits.  [x]   Health maintenance and immunizations reviewed. Please refer to Health maintenance section.      Orders: -     CBC with Differential/Platelet; Future -     Comprehensive metabolic panel; Future -     Lipid panel; Future -     VITAMIN D 25 Hydroxy (Vit-D Deficiency, Fractures); Future -     Hemoglobin A1c; Future  Major depressive disorder, recurrent episode, moderate (HCC) Assessment & Plan: Stable on Wellbutrin XL 150 mg daily & nortriptyline 25 mg qhs.   Orders: -     buPROPion HCl ER (XL); TAKE 1 TABLET BY MOUTH EVERY MORNING  Dispense: 90 tablet; Refill: 1  Generalized anxiety disorder -     buPROPion HCl ER (XL); TAKE 1 TABLET BY MOUTH EVERY MORNING  Dispense: 90 tablet; Refill: 1  Need for shingles vaccine -     Varicella-zoster vaccine IM  Screening for colon cancer -     Cologuard  Vitamin D deficiency -     VITAMIN D 25 Hydroxy (Vit-D Deficiency, Fractures); Future  Acute  cough  Seborrheic keratoses Assessment & Plan: Reassured benign lesions, can freeze / remove these at another time if desired.    Diabetes mellitus screening -     Comprehensive metabolic panel; Future -     Hemoglobin A1c; Future  Screening for cholesterol level -     Lipid panel; Future  Bilateral leg edema Assessment & Plan: Compression stockings. Elevate above heart level at night. Limit salt. Trial Lasix 20 mg. Pt aware of risks vs benefits and possible adverse reactions.    Need for immunization against influenza -     Flu Vaccine QUAD 73mo+IM (Fluarix, Fluzone & Alfiuria Quad PF)  Other orders -     Benzonatate; Take 1 capsule (100 mg total) by mouth 3 (three) times daily as needed for cough.  Dispense: 20 capsule; Refill: 0 -     Furosemide; Take 1 tablet (20 mg total) by mouth daily.  Dispense: 30 tablet; Refill: 0        Return in about 6 months (around 05/20/2022) for recheck.   Abbygael Curtiss M Shedrick Sarli, PA-C

## 2021-11-19 NOTE — Patient Instructions (Addendum)
Great to see you! Schedule for fasting labs in the next week and also a 6 month recheck with me  Flu and first Shingrix vaccines today  Lasix daily for leg edema - reach out via MyChart how this is working

## 2021-11-20 ENCOUNTER — Other Ambulatory Visit: Payer: Managed Care, Other (non HMO)

## 2021-11-23 DIAGNOSIS — R600 Localized edema: Secondary | ICD-10-CM | POA: Insufficient documentation

## 2021-11-23 DIAGNOSIS — Z0000 Encounter for general adult medical examination without abnormal findings: Secondary | ICD-10-CM | POA: Insufficient documentation

## 2021-11-23 NOTE — Assessment & Plan Note (Signed)
Stable on Wellbutrin XL 150 mg daily & nortriptyline 25 mg qhs.

## 2021-11-23 NOTE — Assessment & Plan Note (Signed)
Compression stockings. Elevate above heart level at night. Limit salt. Trial Lasix 20 mg. Pt aware of risks vs benefits and possible adverse reactions.

## 2021-11-23 NOTE — Assessment & Plan Note (Signed)
Reassured benign lesions, can freeze / remove these at another time if desired.

## 2021-11-23 NOTE — Assessment & Plan Note (Signed)
Age-appropriate screening and counseling performed today. Will check labs and call with results. Preventive measures discussed and printed in AVS for patient. Cologuard ordered. Flu shot updated. Shingrix updated. Work on increasing exercise, nutrition habits.   Patient Counseling: [x]   Nutrition: Stressed importance of moderation in sodium/caffeine intake, saturated fat and cholesterol, caloric balance, sufficient intake of fresh fruits, vegetables, and fiber.  [x]   Stressed the importance of regular exercise.   [x]   Substance Abuse: Discussed cessation/primary prevention of tobacco, alcohol, or other drug use; driving or other dangerous activities under the influence; availability of treatment for abuse.   []   Injury prevention: Discussed safety belts, safety helmets, smoke detector, smoking near bedding or upholstery.   [x]   Sexuality: Discussed sexually transmitted diseases, partner selection, use of condoms, avoidance of unintended pregnancy  and contraceptive alternatives.   [x]   Dental health: Discussed importance of regular tooth brushing, flossing, and dental visits.  [x]   Health maintenance and immunizations reviewed. Please refer to Health maintenance section.

## 2021-11-25 ENCOUNTER — Encounter: Payer: Self-pay | Admitting: Physician Assistant

## 2021-11-26 ENCOUNTER — Other Ambulatory Visit (INDEPENDENT_AMBULATORY_CARE_PROVIDER_SITE_OTHER): Payer: Managed Care, Other (non HMO)

## 2021-11-26 DIAGNOSIS — Z13220 Encounter for screening for lipoid disorders: Secondary | ICD-10-CM

## 2021-11-26 DIAGNOSIS — E559 Vitamin D deficiency, unspecified: Secondary | ICD-10-CM

## 2021-11-26 DIAGNOSIS — Z0000 Encounter for general adult medical examination without abnormal findings: Secondary | ICD-10-CM | POA: Diagnosis not present

## 2021-11-26 DIAGNOSIS — Z131 Encounter for screening for diabetes mellitus: Secondary | ICD-10-CM

## 2021-11-26 LAB — COMPREHENSIVE METABOLIC PANEL
ALT: 27 U/L (ref 0–35)
AST: 20 U/L (ref 0–37)
Albumin: 4.2 g/dL (ref 3.5–5.2)
Alkaline Phosphatase: 63 U/L (ref 39–117)
BUN: 18 mg/dL (ref 6–23)
CO2: 26 mEq/L (ref 19–32)
Calcium: 8.7 mg/dL (ref 8.4–10.5)
Chloride: 102 mEq/L (ref 96–112)
Creatinine, Ser: 0.76 mg/dL (ref 0.40–1.20)
GFR: 90.03 mL/min (ref >60.00)
Glucose, Bld: 114 mg/dL — ABNORMAL HIGH (ref 70–99)
Potassium: 4.2 mEq/L (ref 3.5–5.1)
Sodium: 136 mEq/L (ref 135–145)
Total Bilirubin: 0.5 mg/dL (ref 0.2–1.2)
Total Protein: 7.2 g/dL (ref 6.0–8.3)

## 2021-11-26 LAB — CBC WITH DIFFERENTIAL/PLATELET
Basophils Absolute: 0.1 10*3/uL (ref 0.0–0.1)
Basophils Relative: 0.8 % (ref 0.0–3.0)
Eosinophils Absolute: 0.1 10*3/uL (ref 0.0–0.7)
Eosinophils Relative: 0.6 % (ref 0.0–5.0)
HCT: 41.7 % (ref 36.0–46.0)
Hemoglobin: 13.7 g/dL (ref 12.0–15.0)
Lymphocytes Relative: 16.6 % (ref 12.0–46.0)
Lymphs Abs: 1.8 10*3/uL (ref 0.7–4.0)
MCHC: 32.8 g/dL (ref 30.0–36.0)
MCV: 88.2 fl (ref 78.0–100.0)
Monocytes Absolute: 0.9 10*3/uL (ref 0.1–1.0)
Monocytes Relative: 8.3 % (ref 3.0–12.0)
Neutro Abs: 7.8 10*3/uL — ABNORMAL HIGH (ref 1.4–7.7)
Neutrophils Relative %: 73.7 % (ref 43.0–77.0)
Platelets: 284 10*3/uL (ref 150.0–400.0)
RBC: 4.73 Mil/uL (ref 3.87–5.11)
RDW: 13.8 % (ref 11.5–15.5)
WBC: 10.6 10*3/uL — ABNORMAL HIGH (ref 4.0–10.5)

## 2021-11-26 LAB — LIPID PANEL
Cholesterol: 218 mg/dL — ABNORMAL HIGH (ref 0–200)
HDL: 60 mg/dL (ref >39.00)
LDL Cholesterol: 141 mg/dL — ABNORMAL HIGH (ref 0–99)
NonHDL: 157.68
Total CHOL/HDL Ratio: 4
Triglycerides: 84 mg/dL (ref 0.0–149.0)
VLDL: 16.8 mg/dL (ref 0.0–40.0)

## 2021-11-26 LAB — VITAMIN D 25 HYDROXY (VIT D DEFICIENCY, FRACTURES): VITD: 16.27 ng/mL — ABNORMAL LOW (ref 30.00–100.00)

## 2021-11-26 LAB — HEMOGLOBIN A1C: Hgb A1c MFr Bld: 6.4 % (ref 4.6–6.5)

## 2022-01-20 ENCOUNTER — Ambulatory Visit: Payer: Managed Care, Other (non HMO) | Admitting: Family

## 2022-01-20 ENCOUNTER — Encounter: Payer: Self-pay | Admitting: Family

## 2022-01-20 VITALS — BP 140/82 | HR 100 | Temp 97.9°F | Ht 63.0 in | Wt 219.4 lb

## 2022-01-20 DIAGNOSIS — J069 Acute upper respiratory infection, unspecified: Secondary | ICD-10-CM | POA: Diagnosis not present

## 2022-01-20 LAB — POC COVID19 BINAXNOW: SARS Coronavirus 2 Ag: NEGATIVE

## 2022-01-20 LAB — POCT RAPID STREP A (OFFICE): Rapid Strep A Screen: NEGATIVE

## 2022-01-20 MED ORDER — GUAIFENESIN-CODEINE 100-10 MG/5ML PO SYRP: 5.0000 mL | ORAL_SOLUTION | Freq: Every evening | ORAL | 0 refills | Status: DC | PRN

## 2022-01-20 NOTE — Progress Notes (Signed)
Patient ID: Olivia Sims, female    DOB: 1971/01/10, 52 y.o.   MRN: [id number]  Chief Complaint  Patient presents with   Sinus Problem    Pt c/o Nasal congestion, Sore throat, headaches, Dry cough, Present since Friday. Has tried dayuil/nyquil which did not help symptoms.    HPI:      URI sx:  sx for 5d;  c/o sinus sx w/nasal congestion, drainage, dry cough, headaches, sore throat. Denies fever. Reports getting flu shot this year. Taking Dayquil/Nyquil with some relief.     Assessment & Plan:  1. Viral upper respiratory tract infection - rapid testing neg. Continue OTC sinus/cold medications including Dayquil, Tylenol 1,000mg  every 6 hours, Tessalon pearles, ok to take 2 caps tid, and rescue inhaler prior to bedtime. Sending cough syrup for qhs, advised to NOT take within 4 hours of OXY. Use nasal saline spray tid, humidifier overnight. Increase water intake to at least 2 liters qd.   - POC COVID-19 - POCT rapid strep A - guaiFENesin-codeine (ROBITUSSIN AC) 100-10 MG/5ML syrup; Take 5 mLs by mouth at bedtime as needed for cough or congestion.  Dispense: 100 mL; Refill: 0   Subjective:    Outpatient Medications Prior to Visit  Medication Sig Dispense Refill   albuterol (VENTOLIN HFA) 108 (90 Base) MCG/ACT inhaler Inhale 2 puffs into the lungs 4 (four) times daily as needed.     benzonatate (TESSALON PERLES) 100 MG capsule Take 1 capsule (100 mg total) by mouth 3 (three) times daily as needed for cough. 20 capsule 0   buPROPion (WELLBUTRIN XL) 150 MG 24 hr tablet TAKE 1 TABLET BY MOUTH EVERY MORNING 90 tablet 1   ENBREL SURECLICK 50 MG/ML injection Inject into the skin once a week.     fexofenadine (ALLEGRA) 180 MG tablet Take 180 mg by mouth daily.     folic acid (FOLVITE) 1 MG tablet Take 1 mg by mouth daily.     furosemide (LASIX) 20 MG tablet Take 1 tablet (20 mg total) by mouth daily. 30 tablet 0   gabapentin (NEURONTIN) 300 MG capsule 1 PO q HS, may increase to 1 PO BID if needed  60 capsule 3   ibuprofen (ADVIL) 800 MG tablet Take 1 tablet (800 mg total) by mouth every 8 (eight) hours as needed for mild pain or moderate pain. 30 tablet 0   methotrexate 50 MG/2ML injection Inject 0.70ml into the skin once weekly on the same day of each week (single use vials) 10 mL 2   Methotrexate Sodium (METHOTREXATE, PF,) 50 MG/2ML injection      nortriptyline (PAMELOR) 25 MG capsule TAKE 1 CAPSULE BY MOUTH AT BEDTIME. 90 capsule 1   oxyCODONE-acetaminophen (PERCOCET/ROXICET) 5-325 MG tablet Take 1 tablet by mouth 3 (three) times daily as needed.     oxyCODONE-acetaminophen (PERCOCET/ROXICET) 5-325 MG tablet 1 tablet by mouth four times a day as needed for pain 120 tablet 0   No facility-administered medications prior to visit.   Past Medical History:  Diagnosis Date   Allergy    Complication of anesthesia    bladder " froze' years ago per pt    Depression    Elevated LDL cholesterol level    Endometrial polyp 2021   Exercise-induced asthma    pt denies at 4/20/visit    History of kidney stones    Kidney stones    Pre-diabetes    Prediabetes    Rheumatoid arthritis ([place])    Right ovarian cyst 2021  Sleep apnea    hx of had surgery to correct    Urinary incontinence    Vitamin D deficiency    Past Surgical History:  Procedure Laterality Date   CYSTOSCOPY N/A 05/14/2019   Procedure: CYSTOSCOPY;  Surgeon: Nunzio Cobbs, MD;  Location: [facility];  Service: Gynecology;  Laterality: N/A;   GALLBLADDER SURGERY     KIDNEY STONE SURGERY     OVARIAN CYST REMOVAL Right 05/14/2019   Procedure: Excision RIGHT Para Tubal CYST;  Surgeon: Nunzio Cobbs, MD;  Location: [facility];  Service: Gynecology;  Laterality: Right;  right adnexal cyst removal   PALATE / UVULA BIOPSY / EXCISION     shoulder surgery      TONSILLECTOMY     TOTAL LAPAROSCOPIC HYSTERECTOMY WITH SALPINGECTOMY N/A 05/14/2019   Procedure: TOTAL LAPAROSCOPIC  HYSTERECTOMY WITH SALPINGECTOMY, Pelvic Washings;  Surgeon: Nunzio Cobbs, MD;  Location: [facility];  Service: Gynecology;  Laterality: N/A;   No Known Allergies    Objective:    Physical Exam Vitals and nursing note reviewed.  Constitutional:      Appearance: Normal appearance. She is ill-appearing.     Interventions: Face mask in place.  HENT:     Right Ear: Tympanic membrane and ear canal normal.     Left Ear: Tympanic membrane and ear canal normal.     Nose:     Right Sinus: No frontal sinus tenderness.     Left Sinus: No frontal sinus tenderness.     Mouth/Throat:     Mouth: Mucous membranes are moist.     Pharynx: Posterior oropharyngeal erythema present. No pharyngeal swelling, oropharyngeal exudate or uvula swelling.     Tonsils: No tonsillar exudate or tonsillar abscesses. 0 on the right. 0 on the left.  Cardiovascular:     Rate and Rhythm: Normal rate and regular rhythm.  Pulmonary:     Effort: Pulmonary effort is normal.     Breath sounds: Normal breath sounds.  Musculoskeletal:        General: Normal range of motion.  Lymphadenopathy:     Head:     Right side of head: No preauricular or posterior auricular adenopathy.     Left side of head: No preauricular or posterior auricular adenopathy.     Cervical: No cervical adenopathy.  Skin:    General: Skin is warm and dry.  Neurological:     Mental Status: She is alert.  Psychiatric:        Mood and Affect: Mood normal.        Behavior: Behavior normal.    BP (!) 140/82 (BP Location: Left Arm, Patient Position: Sitting, Cuff Size: Large)   Pulse 100   Temp 97.9 F (36.6 C) (Temporal)   Ht 5\' 3"  (1.6 m)   Wt 219 lb 6 oz (99.5 kg)   LMP 04/19/2019   SpO2 99%   BMI 38.86 kg/m  Wt Readings from Last 3 Encounters:  01/20/22 219 lb 6 oz (99.5 kg)  11/19/21 222 lb 9.6 oz (101 kg)  09/01/21 224 lb (101.6 kg)       Jeanie Sewer, NP

## 2022-01-31 ENCOUNTER — Encounter: Payer: Self-pay | Admitting: Physician Assistant

## 2022-02-01 ENCOUNTER — Ambulatory Visit (INDEPENDENT_AMBULATORY_CARE_PROVIDER_SITE_OTHER): Payer: Managed Care, Other (non HMO) | Admitting: Physician Assistant

## 2022-02-01 ENCOUNTER — Encounter: Payer: Self-pay | Admitting: Physician Assistant

## 2022-02-01 VITALS — BP 128/86 | HR 82 | Temp 97.9°F | Ht 63.0 in | Wt 223.4 lb

## 2022-02-01 DIAGNOSIS — R051 Acute cough: Secondary | ICD-10-CM

## 2022-02-01 DIAGNOSIS — J0100 Acute maxillary sinusitis, unspecified: Secondary | ICD-10-CM

## 2022-02-01 DIAGNOSIS — H6691 Otitis media, unspecified, right ear: Secondary | ICD-10-CM

## 2022-02-01 MED ORDER — ALBUTEROL SULFATE HFA 108 (90 BASE) MCG/ACT IN AERS: 1.0000 | INHALATION_SPRAY | Freq: Four times a day (QID) | RESPIRATORY_TRACT | 1 refills | Status: DC | PRN

## 2022-02-01 MED ORDER — FLUTICASONE PROPIONATE 50 MCG/ACT NA SUSP: 2.0000 | Freq: Every day | NASAL | 3 refills | Status: AC

## 2022-02-01 MED ORDER — DOXYCYCLINE HYCLATE 100 MG PO TABS: 100.0000 mg | ORAL_TABLET | Freq: Two times a day (BID) | ORAL | 0 refills | Status: AC

## 2022-02-01 MED ORDER — PREDNISONE 20 MG PO TABS: 20.0000 mg | ORAL_TABLET | Freq: Two times a day (BID) | ORAL | 0 refills | Status: AC

## 2022-02-01 NOTE — Telephone Encounter (Signed)
Please see pt msg and advise 

## 2022-02-01 NOTE — Telephone Encounter (Signed)
Patient scheduled for today 2:15pm

## 2022-02-01 NOTE — Progress Notes (Signed)
Subjective:    Patient ID: Olivia Sims, female    DOB: 1970/04/18, 52 y.o.   MRN: [id number]  Chief Complaint  Patient presents with   Cough    Pt c/o persistent cough, right ear pain, nasal congestion and post nasal drip; finished cough meds prescribed, Dayquil not working; Right ear started popping  about 4 days ago and in the evenings starts aching and feels painful; Covid negative 1/3 visit;     HPI Patient is in today for ongoing sick symptoms as described in CC note. Now having R ear pain as well.    Past Medical History:  Diagnosis Date   Allergy    Complication of anesthesia    bladder " froze' years ago per pt    Depression    Elevated LDL cholesterol level    Endometrial polyp 2021   Exercise-induced asthma    pt denies at 4/20/visit    History of kidney stones    Kidney stones    Pre-diabetes    Prediabetes    Rheumatoid arthritis ([place])    Right ovarian cyst 2021   Sleep apnea    hx of had surgery to correct    Urinary incontinence    Vitamin D deficiency     Past Surgical History:  Procedure Laterality Date   CYSTOSCOPY N/A 05/14/2019   Procedure: CYSTOSCOPY;  Surgeon: Nunzio Cobbs, MD;  Location: [facility];  Service: Gynecology;  Laterality: N/A;   GALLBLADDER SURGERY     KIDNEY STONE SURGERY     OVARIAN CYST REMOVAL Right 05/14/2019   Procedure: Excision RIGHT Para Tubal CYST;  Surgeon: Nunzio Cobbs, MD;  Location: [facility];  Service: Gynecology;  Laterality: Right;  right adnexal cyst removal   PALATE / UVULA BIOPSY / EXCISION     shoulder surgery      TONSILLECTOMY     TOTAL LAPAROSCOPIC HYSTERECTOMY WITH SALPINGECTOMY N/A 05/14/2019   Procedure: TOTAL LAPAROSCOPIC HYSTERECTOMY WITH SALPINGECTOMY, Pelvic Washings;  Surgeon: Nunzio Cobbs, MD;  Location: [facility];  Service: Gynecology;  Laterality: N/A;    Family History  Problem Relation Age of Onset    Diabetes Mother    Hypothyroidism Mother    Heart Problems Mother        pacemaker    Cancer Mother        endometrial ca?   Thyroid disease Mother    Hypertension Father    Diabetes Father    Non-Hodgkin's lymphoma Paternal Grandmother     Social History   Tobacco Use   Smoking status: Former    Packs/day: 1.00    Years: 16.00    Total pack years: 16.00    Types: Cigarettes    Quit date: 2002    Years since quitting: 22.0   Smokeless tobacco: Never  Vaping Use   Vaping Use: Never used  Substance Use Topics   Alcohol use: Not Currently    Alcohol/week: 2.0 standard drinks of alcohol    Types: 1 Glasses of wine, 1 Cans of beer per week    Comment: Occ   Drug use: Never     No Known Allergies  Review of Systems NEGATIVE UNLESS OTHERWISE INDICATED IN HPI      Objective:     BP 128/86 (BP Location: Left Arm)   Pulse 82   Temp 97.9 F (36.6 C) (Temporal)   Ht 5\' 3"  (1.6 m)  Wt 223 lb 6.4 oz (101.3 kg)   LMP 04/19/2019   SpO2 98%   BMI 39.57 kg/m   Wt Readings from Last 3 Encounters:  02/01/22 223 lb 6.4 oz (101.3 kg)  01/20/22 219 lb 6 oz (99.5 kg)  11/19/21 222 lb 9.6 oz (101 kg)    BP Readings from Last 3 Encounters:  02/01/22 128/86  01/20/22 (!) 140/82  11/19/21 130/86     Physical Exam Vitals and nursing note reviewed.  Constitutional:      General: She is not in acute distress.    Appearance: Normal appearance. She is not ill-appearing.  HENT:     Head: Normocephalic.     Right Ear: Ear canal and external ear normal. A middle ear effusion is present. Tympanic membrane is erythematous.     Left Ear: Tympanic membrane, ear canal and external ear normal.     Nose: Congestion present.     Mouth/Throat:     Mouth: Mucous membranes are moist.     Pharynx: No oropharyngeal exudate or posterior oropharyngeal erythema.  Eyes:     Extraocular Movements: Extraocular movements intact.     Conjunctiva/sclera: Conjunctivae normal.     Pupils:  Pupils are equal, round, and reactive to light.  Cardiovascular:     Rate and Rhythm: Normal rate and regular rhythm.     Pulses: Normal pulses.     Heart sounds: Normal heart sounds. No murmur heard. Pulmonary:     Effort: Pulmonary effort is normal. No respiratory distress.     Breath sounds: Normal breath sounds. No wheezing.  Musculoskeletal:     Cervical back: Normal range of motion.  Skin:    General: Skin is warm.  Neurological:     Mental Status: She is alert and oriented to person, place, and time.  Psychiatric:        Mood and Affect: Mood normal.        Behavior: Behavior normal.        Assessment & Plan:  Acute cough  Acute maxillary sinusitis, recurrence not specified  Right otitis media, unspecified otitis media type  Other orders -     predniSONE; Take 1 tablet (20 mg total) by mouth 2 (two) times daily with a meal for 5 days.  Dispense: 10 tablet; Refill: 0 -     Fluticasone Propionate; Place 2 sprays into both nostrils daily.  Dispense: 16 g; Refill: 3 -     Doxycycline Hyclate; Take 1 tablet (100 mg total) by mouth 2 (two) times daily for 7 days.  Dispense: 14 tablet; Refill: 0 -     Albuterol Sulfate HFA; Inhale 1-2 puffs into the lungs every 6 (six) hours as needed.  Dispense: 8 g; Refill: 1   Take prednisone, doxycycline, Flonase as directed. Pt aware of risks vs benefits and possible adverse reactions. Rescue inhaler prn.     Return if symptoms worsen or fail to improve.     Mischa Pollard M Kawehi Hostetter, PA-C

## 2022-02-12 ENCOUNTER — Other Ambulatory Visit: Payer: Self-pay

## 2022-02-23 ENCOUNTER — Other Ambulatory Visit (HOSPITAL_COMMUNITY): Payer: Self-pay

## 2022-05-08 ENCOUNTER — Other Ambulatory Visit: Payer: Self-pay | Admitting: Physician Assistant

## 2022-05-08 ENCOUNTER — Other Ambulatory Visit (HOSPITAL_COMMUNITY): Payer: Self-pay

## 2022-05-10 ENCOUNTER — Other Ambulatory Visit (HOSPITAL_COMMUNITY): Payer: Self-pay

## 2022-05-10 ENCOUNTER — Other Ambulatory Visit: Payer: Self-pay

## 2022-05-10 ENCOUNTER — Encounter (HOSPITAL_COMMUNITY): Payer: Self-pay

## 2022-05-10 MED ORDER — FUROSEMIDE 20 MG PO TABS
20.0000 mg | ORAL_TABLET | Freq: Every day | ORAL | 0 refills | Status: DC
  Filled 2022-05-10 – 2022-05-19 (×2): qty 30, 30d supply, fill #0

## 2022-05-10 MED ORDER — "INSULIN SYRINGE-NEEDLE U-100 27G X 5/8"" 1 ML MISC"
3 refills | Status: AC
  Filled 2022-05-10 – 2022-05-19 (×2): qty 10, 70d supply, fill #0
  Filled 2022-10-06: qty 10, 70d supply, fill #1

## 2022-05-11 ENCOUNTER — Other Ambulatory Visit (HOSPITAL_COMMUNITY): Payer: Self-pay

## 2022-05-12 ENCOUNTER — Other Ambulatory Visit (HOSPITAL_COMMUNITY): Payer: Self-pay

## 2022-05-19 ENCOUNTER — Other Ambulatory Visit (HOSPITAL_COMMUNITY): Payer: Self-pay

## 2022-05-20 ENCOUNTER — Ambulatory Visit: Payer: Managed Care, Other (non HMO) | Admitting: Physician Assistant

## 2022-05-20 ENCOUNTER — Encounter: Payer: Self-pay | Admitting: Physician Assistant

## 2022-05-20 ENCOUNTER — Other Ambulatory Visit: Payer: Self-pay | Admitting: Physician Assistant

## 2022-05-20 VITALS — BP 116/78 | HR 85 | Temp 98.2°F | Ht 63.0 in | Wt 227.8 lb

## 2022-05-20 DIAGNOSIS — M069 Rheumatoid arthritis, unspecified: Secondary | ICD-10-CM | POA: Diagnosis not present

## 2022-05-20 DIAGNOSIS — Z23 Encounter for immunization: Secondary | ICD-10-CM | POA: Diagnosis not present

## 2022-05-20 DIAGNOSIS — R600 Localized edema: Secondary | ICD-10-CM | POA: Diagnosis not present

## 2022-05-20 DIAGNOSIS — Z1231 Encounter for screening mammogram for malignant neoplasm of breast: Secondary | ICD-10-CM

## 2022-05-20 DIAGNOSIS — R7303 Prediabetes: Secondary | ICD-10-CM | POA: Diagnosis not present

## 2022-05-20 DIAGNOSIS — Z1211 Encounter for screening for malignant neoplasm of colon: Secondary | ICD-10-CM

## 2022-05-20 DIAGNOSIS — N951 Menopausal and female climacteric states: Secondary | ICD-10-CM

## 2022-05-20 LAB — POCT GLYCOSYLATED HEMOGLOBIN (HGB A1C): Hemoglobin A1C: 5.9 % — AB (ref 4.0–5.6)

## 2022-05-20 NOTE — Patient Instructions (Addendum)
Try Black cohosh OTC for your hot flashes - check with your rheumatologist first to make sure no interactions with RA medications  Your A1c was 5.9%, much better than the 6.4% previously! Great work!   Keep working on good dietary and exercise habits.  Mammogram has been scheduled.  Referral to [facility] for colonoscopy screening.   Call sooner if any concerns!

## 2022-05-20 NOTE — Assessment & Plan Note (Signed)
Compression stockings. Elevate above heart level at night. Limit salt.  Lasix 20 mg intermittently is helping, will call for refills. Pt aware of risks vs benefits and possible adverse reactions.

## 2022-05-20 NOTE — Assessment & Plan Note (Signed)
Follows with rheum - doing very well with current treatment plan.

## 2022-05-20 NOTE — Progress Notes (Signed)
Subjective:    Patient ID: Olivia Sims, female    DOB: Sep 30, 1970, 52 y.o.   MRN: [id number]  Chief Complaint  Patient presents with   Medical Management of Chronic Issues    Pt in ofr 6 mon f/u for recheck; pt takes flonase as needed and agrees she should take daily; pt has no concerns to discuss and states she has been doing well other than seasonal allergies. Scheduled pt Mammogram w/ breast Cnt for 06/23/22 @ 1:50pm      HPI Patient is in today for 6 month f/up.  Feeling well overall. See A/P.    Past Medical History:  Diagnosis Date   Allergy    Complication of anesthesia    bladder " froze' years ago per pt    Depression    Elevated LDL cholesterol level    Endometrial polyp 2021   Exercise-induced asthma    pt denies at 4/20/visit    History of kidney stones    Kidney stones    Pre-diabetes    Prediabetes    Rheumatoid arthritis (HCC)    Right ovarian cyst 2021   Sleep apnea    hx of had surgery to correct    Urinary incontinence    Vitamin D deficiency     Past Surgical History:  Procedure Laterality Date   CYSTOSCOPY N/A 05/14/2019   Procedure: CYSTOSCOPY;  Surgeon: Patton Salles, MD;  Location: [facility];  Service: Gynecology;  Laterality: N/A;   GALLBLADDER SURGERY     KIDNEY STONE SURGERY     OVARIAN CYST REMOVAL Right 05/14/2019   Procedure: Excision RIGHT Para Tubal CYST;  Surgeon: Patton Salles, MD;  Location: [facility];  Service: Gynecology;  Laterality: Right;  right adnexal cyst removal   PALATE / UVULA BIOPSY / EXCISION     shoulder surgery      TONSILLECTOMY     TOTAL LAPAROSCOPIC HYSTERECTOMY WITH SALPINGECTOMY N/A 05/14/2019   Procedure: TOTAL LAPAROSCOPIC HYSTERECTOMY WITH SALPINGECTOMY, Pelvic Washings;  Surgeon: Patton Salles, MD;  Location: [facility];  Service: Gynecology;  Laterality: N/A;    Family History  Problem Relation Age of Onset   Diabetes  Mother    Hypothyroidism Mother    Heart Problems Mother        pacemaker    Cancer Mother        endometrial ca?   Thyroid disease Mother    Hypertension Father    Diabetes Father    Non-Hodgkin's lymphoma Paternal Grandmother     Social History   Tobacco Use   Smoking status: Former    Packs/day: 1.00    Years: 16.00    Additional pack years: 0.00    Total pack years: 16.00    Types: Cigarettes    Quit date: 2002    Years since quitting: 22.3   Smokeless tobacco: Never  Vaping Use   Vaping Use: Never used  Substance Use Topics   Alcohol use: Not Currently    Alcohol/week: 2.0 standard drinks of alcohol    Types: 1 Glasses of wine, 1 Cans of beer per week    Comment: Occ   Drug use: Never     No Known Allergies  Review of Systems NEGATIVE UNLESS OTHERWISE INDICATED IN HPI      Objective:     BP 116/78 (BP Location: Left Arm)   Pulse 85   Temp 98.2 F (36.8  C) (Temporal)   Ht 5\' 3"  (1.6 m)   Wt 227 lb 12.8 oz (103.3 kg)   LMP 04/19/2019   SpO2 98%   BMI 40.35 kg/m   Wt Readings from Last 3 Encounters:  05/20/22 227 lb 12.8 oz (103.3 kg)  02/01/22 223 lb 6.4 oz (101.3 kg)  01/20/22 219 lb 6 oz (99.5 kg)    BP Readings from Last 3 Encounters:  05/20/22 116/78  02/01/22 128/86  01/20/22 (!) 140/82     Physical Exam Vitals and nursing note reviewed.  Constitutional:      Appearance: Normal appearance. She is obese.  Eyes:     Extraocular Movements: Extraocular movements intact.     Conjunctiva/sclera: Conjunctivae normal.     Pupils: Pupils are equal, round, and reactive to light.  Cardiovascular:     Rate and Rhythm: Normal rate and regular rhythm.  Pulmonary:     Effort: Pulmonary effort is normal.     Breath sounds: Normal breath sounds.  Musculoskeletal:     Right lower leg: Edema present.     Left lower leg: Edema present.  Neurological:     Mental Status: She is alert.  Psychiatric:        Mood and Affect: Mood normal.         Behavior: Behavior normal.        Assessment & Plan:  Prediabetes Assessment & Plan: Lab Results  Component Value Date   HGBA1C 5.9 (A) 05/20/2022   HGBA1C 6.4 11/26/2021   HGBA1C 5.8 03/31/2021   Great improvement! She'll keep working on lifestyle.    Orders: -     POCT glycosylated hemoglobin (Hb A1C)  Bilateral leg edema Assessment & Plan: Compression stockings. Elevate above heart level at night. Limit salt.  Lasix 20 mg intermittently is helping, will call for refills. Pt aware of risks vs benefits and possible adverse reactions.    Rheumatoid arthritis, involving unspecified site, unspecified whether rheumatoid factor present [facility]) Assessment & Plan: Follows with rheum - doing very well with current treatment plan.   Screening for colon cancer -     Ambulatory referral to Gastroenterology  Immunization due -     Tdap vaccine greater than or equal to 7yo IM -     Varicella-zoster vaccine IM  Menopausal flushing   --hx hysterectomy, having some flushing, will have her trial black cohosh if rheum ok's it      Return in about 6 months (around 11/20/2022) for physical.    Renee Beale M Shalika Arntz, PA-C

## 2022-05-20 NOTE — Assessment & Plan Note (Signed)
Lab Results  Component Value Date   HGBA1C 5.9 (A) 05/20/2022   HGBA1C 6.4 11/26/2021   HGBA1C 5.8 03/31/2021   Great improvement! She'll keep working on lifestyle.

## 2022-06-22 ENCOUNTER — Telehealth: Payer: Self-pay

## 2022-06-22 ENCOUNTER — Encounter: Payer: Self-pay | Admitting: Gastroenterology

## 2022-06-22 NOTE — Telephone Encounter (Signed)
John,   I'm am prepping this patients chart. I see a mention of diff airway in anesthesia report. Please review chart and confirm before I cancel the patient and make the MD aware'  Thanks, Previsit

## 2022-06-23 ENCOUNTER — Ambulatory Visit
Admission: RE | Admit: 2022-06-23 | Discharge: 2022-06-23 | Disposition: A | Discharge status: Home / Self Care | Payer: Managed Care, Other (non HMO) | Source: Ambulatory Visit | Attending: Physician Assistant | Admitting: Physician Assistant

## 2022-06-23 ENCOUNTER — Other Ambulatory Visit: Payer: Self-pay

## 2022-06-23 DIAGNOSIS — R195 Other fecal abnormalities: Secondary | ICD-10-CM

## 2022-06-23 DIAGNOSIS — Z1231 Encounter for screening mammogram for malignant neoplasm of breast: Secondary | ICD-10-CM

## 2022-06-23 NOTE — Telephone Encounter (Signed)
Called the patient to offer a cologuard test. Pt would rather bypass this as she taking medications that have caused her issues with the test in the past.   Olivia Sims can you please add this patient to Dr. Milas Hock list. I have already made the patient aware she would get a call with an apt time when it became avail. I am going to cancel her PV and procedure.   Thank you

## 2022-06-23 NOTE — Telephone Encounter (Signed)
Dr. Tomasa Rand this patient is marked as a difficult intubation please advise if you would like and OV or direct to the hospital.

## 2022-06-24 NOTE — Telephone Encounter (Signed)
Pt scheduled for Colon at [facility] 08/24/22 at 10:30am, case#-[id number].

## 2022-06-24 NOTE — Telephone Encounter (Signed)
Pt scheduled for previsit 08/02/22 at 10:30am. Pt aware of appts.

## 2022-06-25 ENCOUNTER — Other Ambulatory Visit: Payer: Self-pay | Admitting: Physician Assistant

## 2022-06-25 DIAGNOSIS — R928 Other abnormal and inconclusive findings on diagnostic imaging of breast: Secondary | ICD-10-CM

## 2022-07-05 ENCOUNTER — Ambulatory Visit: Payer: Managed Care, Other (non HMO) | Admitting: Family Medicine

## 2022-07-05 ENCOUNTER — Encounter: Payer: Self-pay | Admitting: Family Medicine

## 2022-07-05 VITALS — BP 146/70 | HR 110 | Temp 98.0°F | Ht 63.0 in | Wt 231.2 lb

## 2022-07-05 DIAGNOSIS — R519 Headache, unspecified: Secondary | ICD-10-CM | POA: Diagnosis not present

## 2022-07-05 DIAGNOSIS — J029 Acute pharyngitis, unspecified: Secondary | ICD-10-CM | POA: Diagnosis not present

## 2022-07-05 DIAGNOSIS — R5383 Other fatigue: Secondary | ICD-10-CM | POA: Diagnosis not present

## 2022-07-05 LAB — POCT INFLUENZA A/B
Influenza A, POC: NEGATIVE
Influenza B, POC: NEGATIVE

## 2022-07-05 LAB — POC COVID19 BINAXNOW: SARS Coronavirus 2 Ag: NEGATIVE

## 2022-07-05 MED ORDER — KETOROLAC TROMETHAMINE 60 MG/2ML IM SOLN
60.0000 mg | Freq: Once | INTRAMUSCULAR | Status: DC
Start: 2022-07-05 — End: 2022-07-05

## 2022-07-05 MED ORDER — PROMETHAZINE HCL 25 MG PO TABS: 25.0000 mg | ORAL_TABLET | Freq: Four times a day (QID) | ORAL | 0 refills | Status: AC | PRN

## 2022-07-05 MED ORDER — DICLOFENAC SODIUM 75 MG PO TBEC: 75.0000 mg | DELAYED_RELEASE_TABLET | Freq: Two times a day (BID) | ORAL | 0 refills | Status: DC | PRN

## 2022-07-05 NOTE — Progress Notes (Signed)
Subjective  CC:  Chief Complaint  Patient presents with   Headache    Pt stated that she has been sick for the past week with no change in feeling better since the beginning.    Sore Throat   Ear Pain    HPI: Olivia Sims is a 52 y.o. female who presents to the office today to address the problems listed above in the chief complaint. C/o moderate headache: left frontal>>>> right frontal and occipital. Started 7 days ago and can't terminate it. Has PND and allergies but no f/c/s, cough, sob, or gi sxs. Used nsaids w/ only mild relief. No diplopia, next stiffness, or neuro sxs. No sick contacts. Very remote h/o of a migraine. Immunosuprressed on methotrexate. On chornic narcotics w/o pain relief of headache.    Assessment  1. Intractable headache, unspecified chronicity pattern, unspecified headache type   2. Sore throat   3. Other fatigue      Plan  headache:  intractable. ? Migraine. No clear sign/sx of infection. Voltaren and phenergan ( try abortive migraine mgt, toradol is CI due to methotrexate) and see if can break headache. F/u if not imrproving.   Follow up: prn Visit date not found  Orders Placed This Encounter  Procedures   POC COVID-19   POCT Influenza A/B   No orders of the defined types were placed in this encounter.     I reviewed the patients updated PMH, FH, and SocHx.    Patient Active Problem List   Diagnosis Date Noted   Encounter for annual physical exam 11/23/2021   Bilateral leg edema 11/23/2021   Seborrheic keratoses 11/19/2021   Generalized anxiety disorder 11/19/2021   Major depressive disorder, recurrent episode, moderate (HCC) 11/19/2021   History of COVID-19 10/29/2019   Status post laparoscopic hysterectomy 05/14/2019   Elevated LDL cholesterol level    Obesity (BMI 30-39.9) 01/08/2019   Prediabetes 01/08/2019   Nail deformity 01/08/2019   Vitamin D deficiency 01/08/2019   Exercise-induced asthma    Rheumatoid arthritis (HCC)     Kidney stones    Depression    Current Meds  Medication Sig   albuterol (VENTOLIN HFA) 108 (90 Base) MCG/ACT inhaler Inhale 1-2 puffs into the lungs every 6 (six) hours as needed.   buPROPion (WELLBUTRIN XL) 150 MG 24 hr tablet TAKE 1 TABLET BY MOUTH EVERY MORNING   fluticasone (FLONASE) 50 MCG/ACT nasal spray Place 2 sprays into both nostrils daily.   folic acid (FOLVITE) 1 MG tablet Take 1 mg by mouth daily.   furosemide (LASIX) 20 MG tablet Take 1 tablet (20 mg total) by mouth daily.   Golimumab (SIMPONI ARIA IV) Inject into the vein.   ibuprofen (ADVIL) 800 MG tablet Take 1 tablet (800 mg total) by mouth every 8 (eight) hours as needed for mild pain or moderate pain.   Insulin Syringe-Needle U-100 27G X 5/8" 1 ML MISC Use as directed with methotrexate   methotrexate 50 MG/2ML injection Inject 0.6ml into the skin once weekly on the same day of each week (single use vials)   oxyCODONE-acetaminophen (PERCOCET/ROXICET) 5-325 MG tablet 1 tablet by mouth four times a day as needed for pain    Allergies: Patient has No Known Allergies. Family History: Patient family history includes Cancer in her mother; Diabetes in her father and mother; Heart Problems in her mother; Hypertension in her father; Hypothyroidism in her mother; Non-Hodgkin's lymphoma in her paternal grandmother; Thyroid disease in her mother. Social History:  Patient  reports that she quit smoking about 22 years ago. Her smoking use included cigarettes. She has a 16.00 pack-year smoking history. She has never used smokeless tobacco. She reports that she does not currently use alcohol after a past usage of about 2.0 standard drinks of alcohol per week. She reports that she does not use drugs.  Review of Systems: Constitutional: Negative for fever malaise or anorexia Cardiovascular: negative for chest pain Respiratory: negative for SOB or persistent cough Gastrointestinal: negative for abdominal pain  Objective  Vitals: BP (!)  146/70   Pulse (!) 110   Temp 98 F (36.7 C)   Ht 5\' 3"  (1.6 m)   Wt 231 lb 3.2 oz (104.9 kg)   LMP 04/19/2019   SpO2 95%   BMI 40.96 kg/m  General: no acute distress , appears tired.A&Ox3 HEENT: PEERL, conjunctiva normal, neck is supple, nl tms, OP Cardiovascular:  RRR without murmur or gallop.  Respiratory:  Good breath sounds bilaterally, CTAB with normal respiratory effort Skin:  Warm, no rashes Office Visit on 07/05/2022  Component Date Value Ref Range Status   SARS Coronavirus 2 Ag 07/05/2022 Negative  Negative Final   Influenza A, POC 07/05/2022 Negative  Negative Final   Influenza B, POC 07/05/2022 Negative  Negative Final    Commons side effects, risks, benefits, and alternatives for medications and treatment plan prescribed today were discussed, and the patient expressed understanding of the given instructions. Patient is instructed to call or message via MyChart if he/she has any questions or concerns regarding our treatment plan. No barriers to understanding were identified. We discussed Red Flag symptoms and signs in detail. Patient expressed understanding regarding what to do in case of urgent or emergency type symptoms.  Medication list was reconciled, printed and provided to the patient in AVS. Patient instructions and summary information was reviewed with the patient as documented in the AVS. This note was prepared with assistance of Dragon voice recognition software. Occasional wrong-word or sound-a-like substitutions may have occurred due to the inherent limitations of voice recognition software

## 2022-07-07 ENCOUNTER — Ambulatory Visit: Admission: RE | Admit: 2022-07-07 | Payer: Managed Care, Other (non HMO) | Source: Ambulatory Visit

## 2022-07-07 ENCOUNTER — Ambulatory Visit
Admission: RE | Admit: 2022-07-07 | Discharge: 2022-07-07 | Disposition: A | Discharge status: Home / Self Care | Payer: Managed Care, Other (non HMO) | Source: Ambulatory Visit | Attending: Physician Assistant | Admitting: Physician Assistant

## 2022-07-07 DIAGNOSIS — R928 Other abnormal and inconclusive findings on diagnostic imaging of breast: Secondary | ICD-10-CM

## 2022-07-08 ENCOUNTER — Emergency Department (HOSPITAL_BASED_OUTPATIENT_CLINIC_OR_DEPARTMENT_OTHER)
Admission: EM | Admit: 2022-07-08 | Discharge: 2022-07-08 | Disposition: A | Discharge status: Home / Self Care | Payer: Managed Care, Other (non HMO) | Attending: Emergency Medicine | Admitting: Emergency Medicine

## 2022-07-08 ENCOUNTER — Encounter (HOSPITAL_BASED_OUTPATIENT_CLINIC_OR_DEPARTMENT_OTHER): Payer: Self-pay | Admitting: Emergency Medicine

## 2022-07-08 ENCOUNTER — Encounter: Payer: Self-pay | Admitting: Family Medicine

## 2022-07-08 ENCOUNTER — Emergency Department (HOSPITAL_BASED_OUTPATIENT_CLINIC_OR_DEPARTMENT_OTHER): Payer: Managed Care, Other (non HMO)

## 2022-07-08 ENCOUNTER — Encounter: Payer: Self-pay | Admitting: Physician Assistant

## 2022-07-08 ENCOUNTER — Other Ambulatory Visit: Payer: Self-pay

## 2022-07-08 DIAGNOSIS — Z794 Long term (current) use of insulin: Secondary | ICD-10-CM | POA: Diagnosis not present

## 2022-07-08 DIAGNOSIS — G43909 Migraine, unspecified, not intractable, without status migrainosus: Secondary | ICD-10-CM | POA: Diagnosis not present

## 2022-07-08 DIAGNOSIS — R519 Headache, unspecified: Secondary | ICD-10-CM | POA: Diagnosis present

## 2022-07-08 DIAGNOSIS — G43809 Other migraine, not intractable, without status migrainosus: Secondary | ICD-10-CM

## 2022-07-08 MED ORDER — METOCLOPRAMIDE HCL 5 MG/ML IJ SOLN
10.0000 mg | Freq: Once | INTRAMUSCULAR | Status: AC
  Administered 2022-07-08: 10 mg via INTRAVENOUS
  Filled 2022-07-08: qty 2

## 2022-07-08 MED ORDER — KETOROLAC TROMETHAMINE 15 MG/ML IJ SOLN
15.0000 mg | Freq: Once | INTRAMUSCULAR | Status: DC
  Filled 2022-07-08: qty 1

## 2022-07-08 MED ORDER — DIPHENHYDRAMINE HCL 50 MG/ML IJ SOLN
25.0000 mg | Freq: Once | INTRAMUSCULAR | Status: AC
  Administered 2022-07-08: 25 mg via INTRAVENOUS
  Filled 2022-07-08: qty 1

## 2022-07-08 MED ORDER — SODIUM CHLORIDE 0.9 % IV BOLUS
1000.0000 mL | Freq: Once | INTRAVENOUS | Status: AC
  Administered 2022-07-08: 1000 mL via INTRAVENOUS

## 2022-07-08 MED ORDER — DEXAMETHASONE SODIUM PHOSPHATE 10 MG/ML IJ SOLN
10.0000 mg | Freq: Once | INTRAMUSCULAR | Status: AC
  Administered 2022-07-08: 10 mg via INTRAVENOUS
  Filled 2022-07-08: qty 1

## 2022-07-08 NOTE — ED Triage Notes (Signed)
Pt c/o headache for about 8-9 days. No hx of migraines. Seen PCP Tuesday and was given Voltaren Rx. No relief and was told to come here when she couldn't get in with PCP today.

## 2022-07-08 NOTE — ED Notes (Signed)
Pt given discharge instructions. Opportunities given for questions. Pt verbalizes understanding. Jeslin Bazinet R, RN 

## 2022-07-08 NOTE — ED Provider Notes (Signed)
[place] [facility]ERGENCY DEPARTMENT AT [facility] Provider Note   CSN: [id number] Arrival date & time: 07/08/22  1421     History  Chief Complaint  Patient presents with   Headache    AISHIA DEVICO is a 52 y.o. female, history of rheumatoid arthritis, who presents to the ED secondary to headache x 8 days.  She states that headache occurred 1 day when she woke up, and it was about a 5 out of 10, and has been persistent.  She has taken ibuprofen and oxycodone without any relief.  Describes the headache as initially frontal and then went to the temples, and now is on the left side of her head.  She states that she has photophobia, phonophobia, some nausea, but no vomiting.  Denies any nuchal rigidity, fevers, chills, recent accidents/trauma.  Has went to her primary care doctor, and was given Voltaren, and she states that she has a little bit of relief with the Voltaren, but not much, she states it takes the headache from 8 to a 5.  Notes that she does have a little bit of blurring of the of her vision but only when she is around bright lights.  Reports increased fatigue as well.  Denies any weakness on one side of the body, pain along the temples, loss of vision, paresthesias.  Of note she does not have a history of headaches, and this is different for her.  She is not on any estrogen containing products.  Home Medications Prior to Admission medications   Medication Sig Start Date End Date Taking? Authorizing Provider  albuterol (VENTOLIN HFA) 108 (90 Base) MCG/ACT inhaler Inhale 1-2 puffs into the lungs every 6 (six) hours as needed. 02/01/22   Allwardt, Alyssa M, PA-C  buPROPion (WELLBUTRIN XL) 150 MG 24 hr tablet TAKE 1 TABLET BY MOUTH EVERY MORNING 11/19/21   Allwardt, Alyssa M, PA-C  diclofenac (VOLTAREN) 75 MG EC tablet Take 1 tablet (75 mg total) by mouth 2 (two) times daily as needed. 07/05/22   Willow Ora, MD  fluticasone (FLONASE) 50 MCG/ACT nasal spray Place 2 sprays into both  nostrils daily. 02/01/22   Allwardt, Crist Infante, PA-C  folic acid (FOLVITE) 1 MG tablet Take 1 mg by mouth daily.    [provider]  furosemide (LASIX) 20 MG tablet Take 1 tablet (20 mg total) by mouth daily. 05/10/22   Allwardt, Alyssa M, PA-C  Golimumab (SIMPONI ARIA IV) Inject into the vein.    [provider]  ibuprofen (ADVIL) 800 MG tablet Take 1 tablet (800 mg total) by mouth every 8 (eight) hours as needed for mild pain or moderate pain. 05/14/19   Patton Salles, MD  Insulin Syringe-Needle U-100 27G X 5/8" 1 ML MISC Use as directed with methotrexate 05/10/22     methotrexate 50 MG/2ML injection Inject 0.49ml into the skin once weekly on the same day of each week (single use vials) 08/17/21     oxyCODONE-acetaminophen (PERCOCET/ROXICET) 5-325 MG tablet 1 tablet by mouth four times a day as needed for pain 09/08/21     promethazine (PHENERGAN) 25 MG tablet Take 1 tablet (25 mg total) by mouth every 6 (six) hours as needed for nausea or vomiting. 07/05/22   Willow Ora, MD      Allergies    Patient has no known allergies.    Review of Systems   Review of Systems  Constitutional:  Negative for fever.  Neurological:  Positive for headaches.  Physical Exam Updated Vital Signs BP 123/88   Pulse 72   Temp 97.9 F (36.6 C) (Oral)   Resp 12   Ht 5\' 3"  (1.6 m)   Wt 102.1 kg   LMP 04/19/2019   SpO2 99%   BMI 39.86 kg/m  Physical Exam Vitals and nursing note reviewed.  Constitutional:      General: She is not in acute distress.    Appearance: She is well-developed.  HENT:     Head: Normocephalic and atraumatic.  Eyes:     Extraocular Movements: Extraocular movements intact.     Conjunctiva/sclera: Conjunctivae normal.     Pupils: Pupils are equal, round, and reactive to light.  Cardiovascular:     Rate and Rhythm: Normal rate and regular rhythm.     Heart sounds: No murmur heard. Pulmonary:     Effort: Pulmonary effort is normal. No respiratory  distress.     Breath sounds: Normal breath sounds.  Abdominal:     Palpations: Abdomen is soft.     Tenderness: There is no abdominal tenderness.  Musculoskeletal:        General: No swelling.     Cervical back: Neck supple.     Comments: No nuchal rigidity, or pain with rotation/flexion/extension of neck.  She does have a tenderness to palpation of her precipitous, and left parietal scalp.  Skin:    General: Skin is warm and dry.     Capillary Refill: Capillary refill takes less than 2 seconds.  Neurological:     Mental Status: She is alert.     Cranial Nerves: No cranial nerve deficit.     Sensory: No sensory deficit.     Motor: No weakness.     Coordination: Romberg sign negative. Coordination normal.  Psychiatric:        Mood and Affect: Mood normal.     ED Results / Procedures / Treatments   Labs (all labs ordered are listed, but only abnormal results are displayed) Labs Reviewed - No data to display  EKG None  Radiology CT Head Wo Contrast  Result Date: 07/08/2022 CLINICAL DATA:  Headache, new onset (Age >= 51y) EXAM: CT HEAD WITHOUT CONTRAST TECHNIQUE: Contiguous axial images were obtained from the base of the skull through the vertex without intravenous contrast. RADIATION DOSE REDUCTION: This exam was performed according to the departmental dose-optimization program which includes automated exposure control, adjustment of the mA and/or kV according to patient size and/or use of iterative reconstruction technique. COMPARISON:  None Available. FINDINGS: Brain: No evidence of acute infarction, hemorrhage, hydrocephalus, extra-axial collection or mass lesion/mass effect. Vascular: No hyperdense vessel or unexpected calcification. Skull: Normal. Negative for fracture or focal lesion. Sinuses/Orbits: No middle ear or mastoid effusion. Paranasal sinuses clear. Orbits are unremarkable. Other: None. IMPRESSION: No acute intracranial abnormality. No specific etiology for headaches  identified. Electronically Signed   By: Lorenza Cambridge M.D.   On: 07/08/2022 16:30   MM 3D DIAGNOSTIC MAMMOGRAM UNILATERAL LEFT BREAST  Result Date: 07/07/2022 CLINICAL DATA:  Screening recall for a possible left breast asymmetry. EXAM: DIGITAL DIAGNOSTIC UNILATERAL LEFT MAMMOGRAM WITH TOMOSYNTHESIS TECHNIQUE: Left digital diagnostic mammography and breast tomosynthesis was performed. COMPARISON:  Previous exam(s). ACR Breast Density Category b: There are scattered areas of fibroglandular density. FINDINGS: Previously described, possible focal asymmetry in the upper inner left breast does not persist on today's additional mammographic views. Otherwise, no suspicious findings identified. IMPRESSION: No mammographic evidence of malignancy. RECOMMENDATION: Screening mammogram in one year.(Code:SM-B-01Y) I have discussed  the findings and recommendations with the patient. If applicable, a reminder letter will be sent to the patient regarding the next appointment. BI-RADS CATEGORY  1: Negative. Electronically Signed   By: Sande Brothers M.D.   On: 07/07/2022 14:55   Procedures Procedures    Medications Ordered in ED Medications  diphenhydrAMINE (BENADRYL) injection 25 mg (25 mg Intravenous Given 07/08/22 1545)  metoCLOPramide (REGLAN) injection 10 mg (10 mg Intravenous Given 07/08/22 1545)  sodium chloride 0.9 % bolus 1,000 mL (0 mLs Intravenous Stopped 07/08/22 1650)  dexamethasone (DECADRON) injection 10 mg (10 mg Intravenous Given 07/08/22 1735)    ED Course/ Medical Decision Making/ A&P                             Medical Decision Making Patient is a 52 year old female, new onset headache, no history of headaches, will red flag.  She has no neurodeficits, or signs that indicate a stroke.  Will give her medications to help with the headache, I believe this is likely migraine given her nausea, blurry vision, and left-sided headache.  Amount and/or Complexity of Data Reviewed Radiology: ordered.     Details: Head CT unremarkable Discussion of management or test interpretation with external provider(s): Patient's headache resolved, discharged home.  Head CT unremarkable.  Patient is feeling much better, and was educated on return precautions.  I likely think that she had a migraine, possibly secondary to her menopausal symptoms.  She is not on any estrogen, and she has no CVA like symptoms.  Risk Prescription drug management.    Final Clinical Impression(s) / ED Diagnoses Final diagnoses:  Other migraine without status migrainosus, not intractable    Rx / DC Orders ED Discharge Orders     None         Junior Huezo, Harley Alto, PA 07/08/22 1855    Virgina Norfolk, DO 07/08/22 2102

## 2022-07-08 NOTE — Discharge Instructions (Signed)
Please follow-up with your primary care doctor as needed.  I am glad that you are feeling better today, the good news is your CT scan was unremarkable, and this should be reassuring.  Return to the ER if you have a sudden onset headache, that is intense, with intractable nausea or vomiting.

## 2022-07-12 ENCOUNTER — Ambulatory Visit: Payer: Managed Care, Other (non HMO) | Admitting: Physician Assistant

## 2022-07-15 ENCOUNTER — Encounter: Payer: Managed Care, Other (non HMO) | Admitting: Gastroenterology

## 2022-07-15 ENCOUNTER — Encounter: Payer: Self-pay | Admitting: Physician Assistant

## 2022-07-15 ENCOUNTER — Ambulatory Visit: Payer: Managed Care, Other (non HMO) | Admitting: Physician Assistant

## 2022-07-15 VITALS — BP 119/77 | HR 87 | Temp 97.5°F | Wt 230.6 lb

## 2022-07-15 DIAGNOSIS — R519 Headache, unspecified: Secondary | ICD-10-CM

## 2022-07-15 MED ORDER — METHYLPREDNISOLONE 4 MG PO TBPK
ORAL_TABLET | ORAL | 0 refills | Status: DC
Start: 2022-07-15 — End: 2022-09-01

## 2022-07-15 NOTE — Patient Instructions (Signed)
Take steroid pack as directed - MyChart next week with an update. Good to see you!

## 2022-07-15 NOTE — Progress Notes (Signed)
Subjective:    Patient ID: Olivia Sims, female    DOB: April 11, 1970, 52 y.o.   MRN: [id number]  Chief Complaint  Patient presents with   Follow-up    Ed follow up  , still having headaches has gotten better not as bad , pt has been taking otc medication and rx off and on     HPI Patient is in today for headaches.  This headache has been nearly constant since the beginning of June. She had a visit with Dr. Mardelle Matte on 6/17 and then had an ED visit on 07/08/22.  -Treatment in ER nearly resolved symptoms, but symptoms slowly returning.  Left sided today. Dull ache. Occasionally sharp pain. Today's headache is starting to worsen. Slight nausea, no vomiting. Sound and light sensitivity. Not worse with chewing or talking. Occasional dizzy, but not bad. No hearing changes. No tinnitus. No rash. No recent illness.  She has had a few headaches in past, allergy related. Otherwise no migraine history. Thought when this one first started thought it was allergies. Gradually worsened.   Head CT 07/08/22 negative in the ER   Started Simponi Aria near the beginning of this year.   Past Medical History:  Diagnosis Date   Allergy    Complication of anesthesia    bladder " froze' years ago per pt    Depression    Elevated LDL cholesterol level    Endometrial polyp 2021   Exercise-induced asthma    pt denies at 4/20/visit    History of kidney stones    Kidney stones    Pre-diabetes    Prediabetes    Rheumatoid arthritis (HCC)    Right ovarian cyst 2021   Sleep apnea    hx of had surgery to correct    Urinary incontinence    Vitamin D deficiency     Past Surgical History:  Procedure Laterality Date   CYSTOSCOPY N/A 05/14/2019   Procedure: CYSTOSCOPY;  Surgeon: Patton Salles, MD;  Location: [facility];  Service: Gynecology;  Laterality: N/A;   GALLBLADDER SURGERY     KIDNEY STONE SURGERY     OVARIAN CYST REMOVAL Right 05/14/2019   Procedure: Excision RIGHT Para  Tubal CYST;  Surgeon: Patton Salles, MD;  Location: [facility]  Service: Gynecology;  Laterality: Right;  right adnexal cyst removal   PALATE / UVULA BIOPSY / EXCISION     shoulder surgery      TONSILLECTOMY     TOTAL LAPAROSCOPIC HYSTERECTOMY WITH SALPINGECTOMY N/A 05/14/2019   Procedure: TOTAL LAPAROSCOPIC HYSTERECTOMY WITH SALPINGECTOMY, Pelvic Washings;  Surgeon: Patton Salles, MD;  Location: [facility];  Service: Gynecology;  Laterality: N/A;    Family History  Problem Relation Age of Onset   Diabetes Mother    Hypothyroidism Mother    Heart Problems Mother        pacemaker    Cancer Mother        endometrial ca?   Thyroid disease Mother    Hypertension Father    Diabetes Father    Non-Hodgkin's lymphoma Paternal Grandmother     Social History   Tobacco Use   Smoking status: Former    Packs/day: 1.00    Years: 16.00    Additional pack years: 0.00    Total pack years: 16.00    Types: Cigarettes    Quit date: 2002    Years since quitting: 22.5   Smokeless  tobacco: Never  Vaping Use   Vaping Use: Never used  Substance Use Topics   Alcohol use: Not Currently    Alcohol/week: 2.0 standard drinks of alcohol    Types: 1 Glasses of wine, 1 Cans of beer per week    Comment: Occ   Drug use: Never     No Known Allergies  Review of Systems NEGATIVE UNLESS OTHERWISE INDICATED IN HPI      Objective:     BP 119/77   Pulse 87   Temp (!) 97.5 F (36.4 C)   Wt 230 lb 9.6 oz (104.6 kg)   LMP 04/19/2019   SpO2 97%   BMI 40.85 kg/m   Wt Readings from Last 3 Encounters:  07/15/22 230 lb 9.6 oz (104.6 kg)  07/08/22 225 lb (102.1 kg)  07/05/22 231 lb 3.2 oz (104.9 kg)    BP Readings from Last 3 Encounters:  07/15/22 119/77  07/08/22 123/88  07/05/22 (!) 146/70     Physical Exam Vitals and nursing note reviewed.  Constitutional:      Appearance: Normal appearance. She is normal weight. She is not  toxic-appearing.  HENT:     Head: Normocephalic and atraumatic.     Right Ear: Ear canal and external ear normal.     Left Ear: Ear canal and external ear normal.     Ears:     Comments: Serous fluid bilateral TM    Nose: Nose normal. No congestion.     Mouth/Throat:     Mouth: Mucous membranes are moist.  Eyes:     Extraocular Movements: Extraocular movements intact.     Conjunctiva/sclera: Conjunctivae normal.     Pupils: Pupils are equal, round, and reactive to light.  Cardiovascular:     Rate and Rhythm: Normal rate and regular rhythm.     Pulses: Normal pulses.     Heart sounds: Normal heart sounds.  Pulmonary:     Effort: Pulmonary effort is normal.     Breath sounds: Normal breath sounds.  Musculoskeletal:        General: Normal range of motion.     Cervical back: Normal range of motion and neck supple.  Skin:    General: Skin is warm and dry.  Neurological:     General: No focal deficit present.     Mental Status: She is alert and oriented to person, place, and time.     Cranial Nerves: No cranial nerve deficit.     Motor: No weakness.     Gait: Gait normal.     Comments: Not tender over temporal arteries  Psychiatric:        Mood and Affect: Mood normal.        Behavior: Behavior normal.        Assessment & Plan:  Intractable headache, unspecified chronicity pattern, unspecified headache type -     methylPREDNISolone; Please take per packaging instructions.  Dispense: 21 tablet; Refill: 0  -I personally reviewed ED notes, labs, CT; all negative.  -No red flags on exam today -clinical picture c/w likely atypical migraine -she did best with decadron IM in the ED; at this time, plan to do a medrol dose pak; no OTC meds at all; stay hydrated and rest -pt to Mychart me next week with an update -Back to ED if acute worse / change in symptoms     Return in about 5 months (around 12/15/2022) for physical, fasting labs .    Yuri Flener M Minahil Quinlivan, PA-C

## 2022-08-01 ENCOUNTER — Encounter: Payer: Self-pay | Admitting: Physician Assistant

## 2022-08-02 ENCOUNTER — Ambulatory Visit (AMBULATORY_SURGERY_CENTER): Payer: Managed Care, Other (non HMO)

## 2022-08-02 ENCOUNTER — Other Ambulatory Visit: Payer: Self-pay | Admitting: Physician Assistant

## 2022-08-02 VITALS — Ht 63.0 in | Wt 225.0 lb

## 2022-08-02 DIAGNOSIS — Z1211 Encounter for screening for malignant neoplasm of colon: Secondary | ICD-10-CM

## 2022-08-02 MED ORDER — TOPIRAMATE 25 MG PO TABS: 25.0000 mg | ORAL_TABLET | Freq: Every evening | ORAL | 0 refills | Status: DC

## 2022-08-02 MED ORDER — NA SULFATE-K SULFATE-MG SULF 17.5-3.13-1.6 GM/177ML PO SOLN
1.0000 | Freq: Once | ORAL | 0 refills | Status: AC
Start: 2022-08-02 — End: 2022-08-02

## 2022-08-02 NOTE — Progress Notes (Signed)
No egg or soy allergy known to patient  No issues known to pt with past sedation with any surgeries or procedures Patient denies ever being told they had issues or difficulty with intubation  No FH of Malignant Hyperthermia Pt is not on diet pills Pt is not on  home 02  Pt is not on blood thinners  Pt reports occ episode constipation but not currently  No A fib or A flutter Have any cardiac testing pending--no  LOA: independent  Prep: suprep   Patient's chart reviewed by Cathlyn Parsons CNRA prior to previsit and patient appropriate for the [facility].  Previsit completed and red dot placed by patient's name on their procedure day (on provider's schedule).     PV competed with patient. Prep instructions sent via mychart and home address. Goodrx coupon for [employer] [facility] provided to use for price reduction if needed.

## 2022-08-03 ENCOUNTER — Encounter: Payer: Self-pay | Admitting: Gastroenterology

## 2022-08-17 ENCOUNTER — Encounter (HOSPITAL_COMMUNITY): Payer: Self-pay | Admitting: Gastroenterology

## 2022-08-17 NOTE — Progress Notes (Signed)
Attempted to obtain medical history via telephone, unable to reach at this time. HIPAA compliant voicemail message left requesting return call to pre surgical testing department. 

## 2022-08-24 ENCOUNTER — Ambulatory Visit (HOSPITAL_COMMUNITY)
Admission: RE | Admit: 2022-08-24 | Discharge: 2022-08-24 | Disposition: A | Discharge status: Home / Self Care | Payer: Managed Care, Other (non HMO) | Attending: Gastroenterology | Admitting: Gastroenterology

## 2022-08-24 ENCOUNTER — Encounter (HOSPITAL_COMMUNITY): Payer: Self-pay | Admitting: Gastroenterology

## 2022-08-24 ENCOUNTER — Ambulatory Visit (HOSPITAL_COMMUNITY): Payer: Self-pay | Admitting: Certified Registered Nurse Anesthetist

## 2022-08-24 ENCOUNTER — Other Ambulatory Visit: Payer: Self-pay

## 2022-08-24 ENCOUNTER — Ambulatory Visit (HOSPITAL_BASED_OUTPATIENT_CLINIC_OR_DEPARTMENT_OTHER): Payer: Managed Care, Other (non HMO) | Admitting: Certified Registered Nurse Anesthetist

## 2022-08-24 ENCOUNTER — Encounter (HOSPITAL_COMMUNITY)
Admission: RE | Disposition: A | Discharge status: Home / Self Care | Payer: Self-pay | Source: Home / Self Care | Attending: Gastroenterology

## 2022-08-24 DIAGNOSIS — Z6841 Body Mass Index (BMI) 40.0 and over, adult: Secondary | ICD-10-CM

## 2022-08-24 DIAGNOSIS — J45990 Exercise induced bronchospasm: Secondary | ICD-10-CM | POA: Diagnosis not present

## 2022-08-24 DIAGNOSIS — Z1211 Encounter for screening for malignant neoplasm of colon: Secondary | ICD-10-CM | POA: Diagnosis not present

## 2022-08-24 DIAGNOSIS — F32A Depression, unspecified: Secondary | ICD-10-CM | POA: Diagnosis not present

## 2022-08-24 DIAGNOSIS — Z87891 Personal history of nicotine dependence: Secondary | ICD-10-CM | POA: Diagnosis not present

## 2022-08-24 DIAGNOSIS — G4730 Sleep apnea, unspecified: Secondary | ICD-10-CM | POA: Insufficient documentation

## 2022-08-24 DIAGNOSIS — J45909 Unspecified asthma, uncomplicated: Secondary | ICD-10-CM

## 2022-08-24 DIAGNOSIS — M069 Rheumatoid arthritis, unspecified: Secondary | ICD-10-CM | POA: Diagnosis not present

## 2022-08-24 DIAGNOSIS — R195 Other fecal abnormalities: Secondary | ICD-10-CM

## 2022-08-24 HISTORY — PX: COLONOSCOPY WITH PROPOFOL: SHX5780

## 2022-08-24 LAB — HM COLONOSCOPY

## 2022-08-24 SURGERY — COLONOSCOPY WITH PROPOFOL
Anesthesia: Monitor Anesthesia Care

## 2022-08-24 MED ORDER — PROPOFOL 500 MG/50ML IV EMUL
INTRAVENOUS | Status: AC
  Filled 2022-08-24: qty 50

## 2022-08-24 MED ORDER — PROPOFOL 10 MG/ML IV BOLUS
INTRAVENOUS | Status: DC | PRN
  Administered 2022-08-24: 30 mg via INTRAVENOUS

## 2022-08-24 MED ORDER — LIDOCAINE 2% (20 MG/ML) 5 ML SYRINGE
INTRAMUSCULAR | Status: DC | PRN
  Administered 2022-08-24: 50 mg via INTRAVENOUS

## 2022-08-24 MED ORDER — PROPOFOL 500 MG/50ML IV EMUL
INTRAVENOUS | Status: DC | PRN
  Administered 2022-08-24: 150 ug/kg/min via INTRAVENOUS

## 2022-08-24 MED ORDER — LACTATED RINGERS IV SOLN: INTRAVENOUS | Status: DC

## 2022-08-24 MED ORDER — SODIUM CHLORIDE 0.9 % IV SOLN: INTRAVENOUS | Status: DC

## 2022-08-24 SURGICAL SUPPLY — 22 items

## 2022-08-24 NOTE — Discharge Instructions (Signed)

## 2022-08-24 NOTE — H&P (Signed)
[facility] History and Physical   Primary Care Physician:  Allwardt, Crist Infante, PA-C   Reason for Procedure:   Colon cancer screening  Plan:    Screening colonoscopy     HPI: Olivia Sims is a 52 y.o. female undergoing initial average risk screening colonoscopy.  She has no family history of colon cancer and no chronic GI symptoms.  She is undergoing colonoscopy in the hospital setting due to a history of difficulty airway/intubation.   Past Medical History:  Diagnosis Date   Allergy    Complication of anesthesia    bladder " froze' years ago per pt    Depression    Elevated LDL cholesterol level    Endometrial polyp 2021   Exercise-induced asthma    pt denies at 4/20/visit    History of kidney stones    Kidney stones    Pre-diabetes    Prediabetes    Rheumatoid arthritis (HCC)    Right ovarian cyst 2021   Sleep apnea    hx of had surgery to correct    Urinary incontinence    Vitamin D deficiency     Past Surgical History:  Procedure Laterality Date   CYSTOSCOPY N/A 05/14/2019   Procedure: CYSTOSCOPY;  Surgeon: Patton Salles, MD;  Location: [facility];  Service: Gynecology;  Laterality: N/A;   GALLBLADDER SURGERY     KIDNEY STONE SURGERY     OVARIAN CYST REMOVAL Right 05/14/2019   Procedure: Excision RIGHT Para Tubal CYST;  Surgeon: Patton Salles, MD;  Location: [facility];  Service: Gynecology;  Laterality: Right;  right adnexal cyst removal   PALATE / UVULA BIOPSY / EXCISION     shoulder surgery      TONSILLECTOMY     TOTAL LAPAROSCOPIC HYSTERECTOMY WITH SALPINGECTOMY N/A 05/14/2019   Procedure: TOTAL LAPAROSCOPIC HYSTERECTOMY WITH SALPINGECTOMY, Pelvic Washings;  Surgeon: Patton Salles, MD;  Location: [facility];  Service: Gynecology;  Laterality: N/A;    Prior to Admission medications   Medication Sig Start Date End Date Taking? Authorizing Provider  buPROPion  (WELLBUTRIN XL) 150 MG 24 hr tablet TAKE 1 TABLET BY MOUTH EVERY MORNING 11/19/21  Yes Allwardt, Alyssa M, PA-C  clobetasol cream (TEMOVATE) 0.05 % Apply 1 Application topically 2 (two) times daily as needed.   Yes [provider]  fluticasone (FLONASE) 50 MCG/ACT nasal spray Place 2 sprays into both nostrils daily. 02/01/22  Yes Allwardt, Alyssa M, PA-C  folic acid (FOLVITE) 1 MG tablet Take 1 mg by mouth daily.   Yes [provider]  ibuprofen (ADVIL) 800 MG tablet Take 1 tablet (800 mg total) by mouth every 8 (eight) hours as needed for mild pain or moderate pain. 05/14/19  Yes Patton Salles, MD  oxyCODONE-acetaminophen (PERCOCET/ROXICET) 5-325 MG tablet 1 tablet by mouth four times a day as needed for pain 09/08/21  Yes   topiramate (TOPAMAX) 25 MG tablet Take 1 tablet (25 mg total) by mouth every evening. Use for migraine prevention. 08/02/22   Allwardt, Crist Infante, PA-C  albuterol (VENTOLIN HFA) 108 (90 Base) MCG/ACT inhaler Inhale 1-2 puffs into the lungs every 6 (six) hours as needed. 02/01/22   Allwardt, Crist Infante, PA-C  diclofenac (VOLTAREN) 75 MG EC tablet Take 1 tablet (75 mg total) by mouth 2 (two) times daily as needed. Patient not taking: Reported on 08/02/2022 07/05/22   Willow Ora, MD  furosemide (LASIX)  20 MG tablet Take 1 tablet (20 mg total) by mouth daily. 05/10/22   Allwardt, Alyssa M, PA-C  Golimumab (SIMPONI ARIA IV) Inject into the vein. Infusion every 8 weeks    [provider]  Insulin Syringe-Needle U-100 27G X 5/8" 1 ML MISC Use as directed with methotrexate 05/10/22     methotrexate 50 MG/2ML injection Inject 0.30ml into the skin once weekly on the same day of each week (single use vials) 08/17/21     methylPREDNISolone (MEDROL DOSEPAK) 4 MG TBPK tablet Please take per packaging instructions. Patient not taking: Reported on 08/02/2022 07/15/22   Allwardt, Crist Infante, PA-C  promethazine (PHENERGAN) 25 MG tablet Take 1 tablet (25 mg total) by mouth  every 6 (six) hours as needed for nausea or vomiting. 07/05/22   Willow Ora, MD    Current Facility-Administered Medications  Medication Dose Route Frequency Provider Last Rate Last Admin   0.9 %  sodium chloride infusion   Intravenous Continuous Jenel Lucks, MD       lactated ringers infusion   Intravenous Continuous Jenel Lucks, MD 10 mL/hr at 08/24/22 0844 New Bag at 08/24/22 0844    Allergies as of 06/23/2022   (No Known Allergies)    Family History  Problem Relation Age of Onset   Diabetes Mother    Hypothyroidism Mother    Heart Problems Mother        pacemaker    Cancer Mother        endometrial ca?   Thyroid disease Mother    Hypertension Father    Diabetes Father    Non-Hodgkin's lymphoma Paternal Grandmother    Esophageal cancer Neg Hx    Rectal cancer Neg Hx    Stomach cancer Neg Hx    Colon polyps Neg Hx    Colon cancer Neg Hx     Social History   Socioeconomic History   Marital status: Single    Spouse name: Not on file   Number of children: 0   Years of education: 12   Highest education level: Not on file  Occupational History   Not on file  Tobacco Use   Smoking status: Former    Current packs/day: 0.00    Average packs/day: 1 pack/day for 16.0 years (16.0 ttl pk-yrs)    Types: Cigarettes    Start date: 14    Quit date: 2002    Years since quitting: 22.6   Smokeless tobacco: Never  Vaping Use   Vaping status: Never Used  Substance and Sexual Activity   Alcohol use: Not Currently    Alcohol/week: 2.0 standard drinks of alcohol    Types: 1 Glasses of wine, 1 Cans of beer per week    Comment: Occ   Drug use: Never   Sexual activity: Not Currently    Birth control/protection: Abstinence  Other Topics Concern   Not on file  Social History Narrative   Lives with sister   R handed   Caffeine: 2-3 drinks a day   Social Determinants of Health   Financial Resource Strain: Not on file  Food Insecurity: Not on file   Transportation Needs: Not on file  Physical Activity: Not on file  Stress: Not on file  Social Connections: Unknown (06/02/2021)   [facility] from [facility]   Social Network    Social Network: Not on file  Intimate Partner Violence: Unknown (04/24/2021)   Received from [facility]   HITS    Physically Hurt: Not  on file    Insult or Talk Down To: Not on file    Threaten Physical Harm: Not on file    Scream or Curse: Not on file    Review of Systems:  All other review of systems negative except as mentioned in the HPI.  Physical Exam: Vital signs BP 111/80   Pulse 82   Temp 97.9 F (36.6 C) (Oral)   Resp 17   Ht 5\' 3"  (1.6 m)   Wt 103.3 kg   LMP 04/19/2019   SpO2 98%   BMI 40.34 kg/m   General:   Alert,  Well-developed, well-nourished, pleasant and cooperative in NAD Airway:  Mallampati 2 Lungs:  Clear throughout to auscultation.   Heart:  Regular rate and rhythm; no murmurs, clicks, rubs,  or gallops. Abdomen:  Soft, nontender and nondistended. Normal bowel sounds.   Neuro/Psych:  Normal mood and affect. A and O x 3    E. Tomasa Rand, MD [facility]

## 2022-08-24 NOTE — Op Note (Signed)
[facility] Patient Name: Olivia Sims Procedure Date: 08/24/2022 MRN: [id number] Attending MD: Dub Amis. Tomasa Rand , MD, [id number] Date of Birth: [birth year] CSN: [id number] Age: 52 Admit Type: Outpatient Procedure:                Colonoscopy Indications:              Screening for colorectal malignant neoplasm, This                            is the patient's first colonoscopy Providers:                Lorin Picket E. Tomasa Rand, MD, Rozetta Nunnery, Technician Referring MD:             Dub Amis. Tomasa Rand, MD Medicines:                Monitored Anesthesia Care Complications:            No immediate complications. Estimated Blood Loss:     Estimated blood loss: none. Procedure:                Pre-Anesthesia Assessment:                           - Prior to the procedure, a History and Physical                            was performed, and patient medications and                            allergies were reviewed. The patient's tolerance of                            previous anesthesia was also reviewed. The risks                            and benefits of the procedure and the sedation                            options and risks were discussed with the patient.                            All questions were answered, and informed consent                            was obtained. Prior Anticoagulants: The patient has                            taken no anticoagulant or antiplatelet agents. ASA                            Grade Assessment: III - A patient with severe                            systemic disease. After reviewing the risks and  benefits, the patient was deemed in satisfactory                            condition to undergo the procedure.                           After obtaining informed consent, the colonoscope                            was passed under direct vision. Throughout the                            procedure, the patient's blood  pressure, pulse, and                            oxygen saturations were monitored continuously. The                            CF-HQ190L ([id number]) Olympus colonoscope was                            introduced through the anus and advanced to the the                            terminal ileum, with identification of the                            appendiceal orifice and IC valve. The colonoscopy                            was performed without difficulty. The patient                            tolerated the procedure well. The quality of the                            bowel preparation was adequate. The terminal ileum,                            ileocecal valve, appendiceal orifice, and rectum                            were photographed. Scope In: 9:50:14 AM Scope Out: 10:02:07 AM Scope Withdrawal Time: 0 hours 9 minutes 4 seconds  Total Procedure Duration: 0 hours 11 minutes 53 seconds  Findings:      The perianal and digital rectal examinations were normal. Pertinent       negatives include normal sphincter tone and no palpable rectal lesions.      The colon (entire examined portion) appeared normal.      The terminal ileum appeared normal.      The retroflexed view of the distal rectum and anal verge was normal and       showed no anal or rectal abnormalities. Impression:               - The  entire examined colon is normal.                           - The examined portion of the ileum was normal.                           - The distal rectum and anal verge are normal on                            retroflexion view.                           - No specimens collected. Moderate Sedation:      Not Applicable - Patient had care per Anesthesia. Recommendation:           - Patient has a contact number available for                            emergencies. The signs and symptoms of potential                            delayed complications were discussed with the                             patient. Return to normal activities tomorrow.                            Written discharge instructions were provided to the                            patient.                           - Resume previous diet.                           - Continue present medications.                           - Repeat colonoscopy in 10 years for screening                            purposes. Procedure Code(s):        --- Professional ---                           Y8657, Colorectal cancer screening; colonoscopy on                            individual not meeting criteria for high risk Diagnosis Code(s):        --- Professional ---                           Z12.11, Encounter for screening for malignant  neoplasm of colon CPT copyright 2022 [employer]. All rights reserved. The codes documented in this report are preliminary and upon coder review may  be revised to meet current compliance requirements.  E. Tomasa Rand, MD 08/24/2022 10:06:52 AM This report has been signed electronically. Number of Addenda: 0

## 2022-08-24 NOTE — Anesthesia Postprocedure Evaluation (Signed)
Anesthesia Post Note  Patient: Olivia Sims  Procedure(s) Performed: COLONOSCOPY WITH PROPOFOL     Patient location during evaluation: Endoscopy Anesthesia Type: MAC Level of consciousness: awake and alert Pain management: pain level controlled Vital Signs Assessment: post-procedure vital signs reviewed and stable Respiratory status: spontaneous breathing, nonlabored ventilation, respiratory function stable and patient connected to nasal cannula oxygen Cardiovascular status: blood pressure returned to baseline and stable Postop Assessment: no apparent nausea or vomiting Anesthetic complications: no  No notable events documented.  Last Vitals:  Vitals:   08/24/22 1012 08/24/22 1020  BP: 125/77 131/82  Pulse: 84 87  Resp: 16 16  Temp: 36.6 C   SpO2: 100% 99%    Last Pain:  Vitals:   08/24/22 1020  TempSrc:   PainSc: 0-No pain                  L 

## 2022-08-24 NOTE — Anesthesia Procedure Notes (Signed)
Procedure Name: MAC Date/Time: 08/24/2022 9:40 AM  Performed by: Vanessa Red Lion, CRNAPre-anesthesia Checklist: Patient identified, Emergency Drugs available, Suction available and Patient being monitored Patient Re-evaluated:Patient Re-evaluated prior to induction Oxygen Delivery Method: Simple face mask

## 2022-08-24 NOTE — Transfer of Care (Signed)
Immediate Anesthesia Transfer of Care Note  Patient: Olivia Sims  Procedure(s) Performed: COLONOSCOPY WITH PROPOFOL  Patient Location: Endoscopy Unit  Anesthesia Type:MAC  Level of Consciousness: awake and patient cooperative  Airway & Oxygen Therapy: Patient Spontanous Breathing and Patient connected to face mask  Post-op Assessment: Report given to RN and Post -op Vital signs reviewed and stable  Post vital signs: Reviewed and stable  Last Vitals:  Vitals Value Taken Time  BP    Temp    Pulse    Resp    SpO2      Last Pain:  Vitals:   08/24/22 0831  TempSrc: Oral  PainSc: 0-No pain         Complications: No notable events documented.

## 2022-08-24 NOTE — Anesthesia Preprocedure Evaluation (Addendum)
Anesthesia Evaluation  Patient identified by MRN, date of birth, ID band Patient awake    Reviewed: Allergy & Precautions, NPO status , Patient's Chart, lab work & pertinent test results  History of Anesthesia Complications (+) history of anesthetic complications (was told she had a dfficult airway in the past. last surgery grade 3 view with Hyacinth Meeker 2, required bougie)  Airway Mallampati: II  TM Distance: >3 FB Neck ROM: Full    Dental no notable dental hx. (+) Teeth Intact, Dental Advisory Given   Pulmonary asthma , sleep apnea , former smoker   Pulmonary exam normal breath sounds clear to auscultation       Cardiovascular negative cardio ROS Normal cardiovascular exam Rhythm:Regular Rate:Normal     Neuro/Psych  PSYCHIATRIC DISORDERS Anxiety Depression    negative neurological ROS     GI/Hepatic negative GI ROS, Neg liver ROS,,,  Endo/Other    Morbid obesity (BMI 40)  Renal/GU negative Renal ROS  negative genitourinary   Musculoskeletal  (+) Arthritis , Rheumatoid disorders,    Abdominal   Peds  Hematology negative hematology ROS (+)   Anesthesia Other Findings   Reproductive/Obstetrics                             Anesthesia Physical Anesthesia Plan  ASA: 3  Anesthesia Plan: MAC   Post-op Pain Management:    Induction: Intravenous  PONV Risk Score and Plan: Propofol infusion and Treatment may vary due to age or medical condition  Airway Management Planned: Natural Airway  Additional Equipment:   Intra-op Plan:   Post-operative Plan:   Informed Consent: I have reviewed the patients History and Physical, chart, labs and discussed the procedure including the risks, benefits and alternatives for the proposed anesthesia with the patient or authorized representative who has indicated his/her understanding and acceptance.     Dental advisory given  Plan Discussed with:  CRNA  Anesthesia Plan Comments:        Anesthesia Quick Evaluation

## 2022-08-29 ENCOUNTER — Encounter (HOSPITAL_COMMUNITY): Payer: Self-pay | Admitting: Gastroenterology

## 2022-08-29 ENCOUNTER — Other Ambulatory Visit: Payer: Self-pay | Admitting: Physician Assistant

## 2022-09-01 ENCOUNTER — Ambulatory Visit (INDEPENDENT_AMBULATORY_CARE_PROVIDER_SITE_OTHER): Payer: Managed Care, Other (non HMO)

## 2022-09-01 ENCOUNTER — Encounter (HOSPITAL_COMMUNITY): Payer: Self-pay

## 2022-09-01 ENCOUNTER — Ambulatory Visit (HOSPITAL_COMMUNITY)
Admission: RE | Admit: 2022-09-01 | Discharge: 2022-09-01 | Disposition: A | Discharge status: Home / Self Care | Payer: Managed Care, Other (non HMO) | Source: Ambulatory Visit | Attending: Internal Medicine | Admitting: Internal Medicine

## 2022-09-01 VITALS — BP 119/78 | HR 93 | Temp 97.6°F

## 2022-09-01 DIAGNOSIS — M25571 Pain in right ankle and joints of right foot: Secondary | ICD-10-CM | POA: Diagnosis not present

## 2022-09-01 MED ORDER — MELOXICAM 15 MG PO TABS: 15.0000 mg | ORAL_TABLET | Freq: Every day | ORAL | 0 refills | Status: AC

## 2022-09-01 NOTE — Discharge Instructions (Addendum)
Your x-ray was normal with no evidence of a fracture or significant arthritis.  Please keep your leg elevated and use the brace for comfort and support.  Ice it for 15 minutes 3 times a day.  Take Mobic daily.  Do not take additional NSAIDs with this medication including aspirin, ibuprofen/Advil, naproxen/Aleve.  You can use acetaminophen/Tylenol as well as your previously prescribed pain medication to help manage your symptoms.  Avoid any strenuous activity including prolonged walking.  If your symptoms or not improving quickly please follow-up with orthopedics; call to schedule an appointment.  If anything worsens return for reevaluation.

## 2022-09-01 NOTE — ED Triage Notes (Addendum)
Patient c/o right ankle pain and states she noticed the pain yesterday, but denies any injury. Patient states wearin gher shoe makes the pain less.  Patient states she took Ibuprofen yesterday AM. Patient states she is currently in pain management.

## 2022-09-01 NOTE — ED Provider Notes (Signed)
[facility]    CSN: [id number] Arrival date & time: 09/01/22  1359      History   Chief Complaint Chief Complaint  Patient presents with   Ankle Pain    Very sharp pain on the inside of my ankle - Entered by patient    HPI Olivia Sims is a 52 y.o. female.   Patient presents today with a several day history of right medial ankle pain.  Denies any known injury increase in activity prior to symptom onset.  Does report she was in the pool and wonders if she could have stepped wrong but does not remember a specific injury.  Pain is rated 8 on a 0-10 pain scale, localized to medial ankle without radiation, described as intense aching, no aggravating or alleviating factors identified.  She denies previous injury or surgery involving her ankle.  Denies any numbness or paresthesias in the foot.  She has tried ibuprofen as well as prescribed oxycodone with temporary improvement of symptoms.  She has a history of rheumatoid arthritis but denies any history of gout.  She does report that the support of her shoe does improve symptoms minimally.  She is having difficulty with her daily duties as a result of symptoms.    Past Medical History:  Diagnosis Date   Allergy    Complication of anesthesia    bladder " froze' years ago per pt    Depression    Elevated LDL cholesterol level    Endometrial polyp 2021   Exercise-induced asthma    pt denies at 4/20/visit    History of kidney stones    Kidney stones    Pre-diabetes    Prediabetes    Rheumatoid arthritis (HCC)    Right ovarian cyst 2021   Sleep apnea    hx of had surgery to correct    Urinary incontinence    Vitamin D deficiency     Patient Active Problem List   Diagnosis Date Noted   Colon cancer screening 08/24/2022   Encounter for annual physical exam 11/23/2021   Bilateral leg edema 11/23/2021   Seborrheic keratoses 11/19/2021   Generalized anxiety disorder 11/19/2021   Major depressive disorder, recurrent  episode, moderate (HCC) 11/19/2021   History of COVID-19 10/29/2019   Status post laparoscopic hysterectomy 05/14/2019   Elevated LDL cholesterol level    Obesity (BMI 30-39.9) 01/08/2019   Prediabetes 01/08/2019   Nail deformity 01/08/2019   Vitamin D deficiency 01/08/2019   Exercise-induced asthma    Rheumatoid arthritis (HCC)    Kidney stones    Depression     Past Surgical History:  Procedure Laterality Date   COLONOSCOPY WITH PROPOFOL N/A 08/24/2022   Procedure: COLONOSCOPY WITH PROPOFOL;  Surgeon: Jenel Lucks, MD;  Location: [facility];  Service: Gastroenterology;  Laterality: N/A;   CYSTOSCOPY N/A 05/14/2019   Procedure: CYSTOSCOPY;  Surgeon: Patton Salles, MD;  Location: [facility];  Service: Gynecology;  Laterality: N/A;   GALLBLADDER SURGERY     KIDNEY STONE SURGERY     OVARIAN CYST REMOVAL Right 05/14/2019   Procedure: Excision RIGHT Para Tubal CYST;  Surgeon: Patton Salles, MD;  Location: [facility];  Service: Gynecology;  Laterality: Right;  right adnexal cyst removal   PALATE / UVULA BIOPSY / EXCISION     shoulder surgery      TONSILLECTOMY     TOTAL LAPAROSCOPIC HYSTERECTOMY WITH SALPINGECTOMY N/A 05/14/2019   Procedure: TOTAL  LAPAROSCOPIC HYSTERECTOMY WITH SALPINGECTOMY, Pelvic Washings;  Surgeon: Patton Salles, MD;  Location: [facility];  Service: Gynecology;  Laterality: N/A;    OB History     Gravida  0   Para  0   Term  0   Preterm  0   AB  0   Living  0      SAB  0   IAB  0   Ectopic  0   Multiple  0   Live Births  0            Home Medications    Prior to Admission medications   Medication Sig Start Date End Date Taking? Authorizing Provider  meloxicam (MOBIC) 15 MG tablet Take 1 tablet (15 mg total) by mouth daily. 09/01/22  Yes ,  K, PA-C  albuterol (VENTOLIN HFA) 108 (90 Base) MCG/ACT inhaler Inhale 1-2 puffs into the lungs  every 6 (six) hours as needed. 02/01/22   Allwardt, Alyssa M, PA-C  buPROPion (WELLBUTRIN XL) 150 MG 24 hr tablet TAKE 1 TABLET BY MOUTH EVERY MORNING 11/19/21   Allwardt, Alyssa M, PA-C  clobetasol cream (TEMOVATE) 0.05 % Apply 1 Application topically 2 (two) times daily as needed.    [provider]  fluticasone (FLONASE) 50 MCG/ACT nasal spray Place 2 sprays into both nostrils daily. 02/01/22   Allwardt, Crist Infante, PA-C  folic acid (FOLVITE) 1 MG tablet Take 1 mg by mouth daily.    [provider]  furosemide (LASIX) 20 MG tablet Take 1 tablet (20 mg total) by mouth daily. 05/10/22   Allwardt, Alyssa M, PA-C  Golimumab (SIMPONI ARIA IV) Inject into the vein. Infusion every 8 weeks    [provider]  Insulin Syringe-Needle U-100 27G X 5/8" 1 ML MISC Use as directed with methotrexate 05/10/22     methotrexate 50 MG/2ML injection Inject 0.17ml into the skin once weekly on the same day of each week (single use vials) 08/17/21     oxyCODONE-acetaminophen (PERCOCET/ROXICET) 5-325 MG tablet 1 tablet by mouth four times a day as needed for pain 09/08/21     promethazine (PHENERGAN) 25 MG tablet Take 1 tablet (25 mg total) by mouth every 6 (six) hours as needed for nausea or vomiting. 07/05/22   Willow Ora, MD  topiramate (TOPAMAX) 25 MG tablet TAKE 1 TABLET BY MOUTH ONCE DAILY IN THE EVENING USE  FOR  MIGRAINE  PREVENTION 08/30/22   Allwardt, Crist Infante, PA-C    Family History Family History  Problem Relation Age of Onset   Diabetes Mother    Hypothyroidism Mother    Heart Problems Mother        pacemaker    Cancer Mother        endometrial ca?   Thyroid disease Mother    Hypertension Father    Diabetes Father    Non-Hodgkin's lymphoma Paternal Grandmother    Esophageal cancer Neg Hx    Rectal cancer Neg Hx    Stomach cancer Neg Hx    Colon polyps Neg Hx    Colon cancer Neg Hx     Social History Social History   Tobacco Use   Smoking status: Former    Current  packs/day: 0.00    Average packs/day: 1 pack/day for 16.0 years (16.0 ttl pk-yrs)    Types: Cigarettes    Start date: 70    Quit date: 2002    Years since quitting: 22.6   Smokeless tobacco:  Never  Vaping Use   Vaping status: Never Used  Substance Use Topics   Alcohol use: Yes    Alcohol/week: 2.0 standard drinks of alcohol    Types: 1 Glasses of wine, 1 Cans of beer per week    Comment: Occ   Drug use: Never     Allergies   Patient has no known allergies.   Review of Systems Review of Systems  Constitutional:  Positive for activity change. Negative for appetite change, fatigue and fever.  Gastrointestinal:  Negative for abdominal pain, diarrhea, nausea and vomiting.  Musculoskeletal:  Positive for arthralgias, gait problem and joint swelling. Negative for myalgias.  Neurological:  Negative for weakness and numbness.     Physical Exam Triage Vital Signs ED Triage Vitals  Encounter Vitals Group     BP 09/01/22 1423 119/78     Systolic BP Percentile --      Diastolic BP Percentile --      Pulse Rate 09/01/22 1423 (!) 104     Resp --      Temp 09/01/22 1423 97.6 F (36.4 C)     Temp Source 09/01/22 1423 Oral     SpO2 09/01/22 1423 95 %     Weight --      Height --      Head Circumference --      Peak Flow --      Pain Score 09/01/22 1432 7     Pain Loc --      Pain Education --      Exclude from Growth Chart --    No data found.  Updated Vital Signs BP 119/78 (BP Location: Right Arm)   Pulse 93   Temp 97.6 F (36.4 C) (Oral)   LMP 04/19/2019   SpO2 95%   Visual Acuity Right Eye Distance:   Left Eye Distance:   Bilateral Distance:    Right Eye Near:   Left Eye Near:    Bilateral Near:     Physical Exam Vitals reviewed.  Constitutional:      General: She is awake. She is not in acute distress.    Appearance: Normal appearance. She is well-developed. She is not ill-appearing.     Comments: Very pleasant female appears stated age in no acute  distress sitting comfortably in exam room  HENT:     Head: Normocephalic and atraumatic.  Cardiovascular:     Rate and Rhythm: Normal rate and regular rhythm.     Heart sounds: Normal heart sounds, S1 normal and S2 normal. No murmur heard.    Comments: Capillary refill within 2 seconds right toes Pulmonary:     Effort: Pulmonary effort is normal.     Breath sounds: Normal breath sounds. No wheezing, rhonchi or rales.     Comments: Clear to auscultation bilaterally Musculoskeletal:     Right ankle: Swelling present. Tenderness present over the medial malleolus. No lateral malleolus tenderness. Decreased range of motion.     Right Achilles Tendon: No tenderness. Thompson's test negative.     Right foot: Normal range of motion and normal capillary refill. No bony tenderness.     Comments: Right ankle/foot: Swelling and tenderness over medial malleolus and anterior ankle.  No deformity noted.  Decreased range of motion with dorsiflexion secondary to pain.  Foot is neurovascularly intact.  No pain or abnormality noted of foot.  No tenderness over Achilles. Antalgic gait.  Feet:     Right foot:     Toenail Condition: Right  toenails are abnormally thick.  Psychiatric:        Behavior: Behavior is cooperative.      UC Treatments / Results  Labs (all labs ordered are listed, but only abnormal results are displayed) Labs Reviewed - No data to display  EKG   Radiology DG Ankle Complete Right  Result Date: 09/01/2022 CLINICAL DATA:  Patient c/o right ankle pain and states she noticed the pain yesterday, but denies any injury. EXAM: RIGHT ANKLE - COMPLETE 3+ VIEW COMPARISON:  None available FINDINGS: No fracture or dislocation. Enthesopathic changes are seen at the insertion of the plantar fascia and Achilles tendon on the calcaneus. IMPRESSION: No acute abnormality of the right ankle. Electronically Signed   By: Acquanetta Belling M.D.   On: 09/01/2022 15:41    Procedures Procedures (including  critical care time)  Medications Ordered in UC Medications - No data to display  Initial Impression / Assessment and Plan / UC Course  I have reviewed the triage vital signs and the nursing notes.  Pertinent labs & imaging results that were available during my care of the patient were reviewed by me and considered in my medical decision making (see chart for details).  Clinical Course as of 09/01/22 1600  Wed Sep 01, 2022  1550 DG Ankle Complete Right [ER]    Clinical Course User Index [ER] , Noberto Retort, PA-C    Patient is well-appearing, afebrile, nontoxic.  She was initially mildly tachycardic on intake but on recheck this had normalized to heart rate of 93 bpm.  X-ray was obtained given bony tenderness over her medial malleolus that showed no acute osseous abnormality.  Suspect sprain as etiology of symptoms.  She was encouraged use RICE protocol.  She was placed in a brace for comfort and support.  We discussed that she should avoid strenuous activity including prolonged ambulation was provided work excuse note.  Will start anti-inflammatory medication of Mobic daily.  We discussed that she is not to take additional NSAIDs with this medication but can use previously prescribed oxycodone as well as Tylenol for pain relief.  If her symptoms are improving quickly she is to follow-up with sports medicine and was given contact information for local provider with instruction to call to schedule an appointment.  We discussed that if she has any worsening or changing symptoms she needs to be seen immediately including increasing pain, swelling, numbness or paresthesias in the foot.  Strict return precautions given.  Final Clinical Impressions(s) / UC Diagnoses   Final diagnoses:  Acute right ankle pain     Discharge Instructions      Your x-ray was normal with no evidence of a fracture or significant arthritis.  Please keep your leg elevated and use the brace for comfort and support.  Ice  it for 15 minutes 3 times a day.  Take Mobic daily.  Do not take additional NSAIDs with this medication including aspirin, ibuprofen/Advil, naproxen/Aleve.  You can use acetaminophen/Tylenol as well as your previously prescribed pain medication to help manage your symptoms.  Avoid any strenuous activity including prolonged walking.  If your symptoms or not improving quickly please follow-up with orthopedics; call to schedule an appointment.  If anything worsens return for reevaluation.     ED Prescriptions     Medication Sig Dispense Auth. Provider   meloxicam (MOBIC) 15 MG tablet Take 1 tablet (15 mg total) by mouth daily. 30 tablet , Noberto Retort, PA-C      PDMP not reviewed this  encounter.   Jeani Hawking, PA-C 09/01/22 1600

## 2022-09-24 ENCOUNTER — Other Ambulatory Visit: Payer: Self-pay | Admitting: Physician Assistant

## 2022-10-06 ENCOUNTER — Other Ambulatory Visit: Payer: Self-pay | Admitting: Physician Assistant

## 2022-10-07 ENCOUNTER — Other Ambulatory Visit: Payer: Self-pay

## 2022-10-08 ENCOUNTER — Other Ambulatory Visit (HOSPITAL_COMMUNITY): Payer: Self-pay

## 2022-10-08 ENCOUNTER — Other Ambulatory Visit: Payer: Self-pay

## 2022-10-08 MED ORDER — FUROSEMIDE 20 MG PO TABS
20.0000 mg | ORAL_TABLET | Freq: Every day | ORAL | 2 refills | Status: AC
  Filled 2022-10-08: qty 30, 30d supply, fill #0

## 2022-10-09 ENCOUNTER — Encounter: Payer: Self-pay | Admitting: Physician Assistant

## 2022-10-11 ENCOUNTER — Other Ambulatory Visit: Payer: Self-pay

## 2022-10-11 MED ORDER — TOPIRAMATE 25 MG PO TABS: 25.0000 mg | ORAL_TABLET | Freq: Every day | ORAL | 1 refills | Status: AC

## 2022-10-11 NOTE — Telephone Encounter (Signed)
Please advise if ok to refill for patient since medication seems to be working

## 2022-10-11 NOTE — Telephone Encounter (Signed)
Please advise if ok to refill medication since it seems to be working for the patient

## 2022-11-08 ENCOUNTER — Encounter: Payer: Self-pay | Admitting: Physician Assistant

## 2022-11-08 NOTE — Telephone Encounter (Signed)
Ok for referral or needing an appointment, please advise

## 2022-11-12 ENCOUNTER — Ambulatory Visit (INDEPENDENT_AMBULATORY_CARE_PROVIDER_SITE_OTHER): Payer: Managed Care, Other (non HMO) | Admitting: Physician Assistant

## 2022-11-12 VITALS — BP 122/72 | HR 95 | Temp 98.2°F | Ht 63.0 in | Wt 230.4 lb

## 2022-11-12 DIAGNOSIS — Z23 Encounter for immunization: Secondary | ICD-10-CM | POA: Diagnosis not present

## 2022-11-12 DIAGNOSIS — G5601 Carpal tunnel syndrome, right upper limb: Secondary | ICD-10-CM

## 2022-11-12 MED ORDER — PREDNISONE 50 MG PO TABS
ORAL_TABLET | ORAL | 0 refills | Status: DC
Start: 2022-11-12 — End: 2023-12-07

## 2022-11-12 NOTE — Progress Notes (Unsigned)
Subjective:    Patient ID: Olivia Sims, female    DOB: 1970/12/13, 52 y.o.   MRN: [id number]  Chief Complaint  Patient presents with   Wrist Pain    Pt in the office to discuss possible Carpel tunnel and wrist pain; pt looking for possible referral to be evaluated; Only having issues with right wrist pain at this time;    Colon Cancer Screening    Pt completed Colonoscopy and results in chart; cologuard needs to removed    HPI Patient is in today for possible carpal tunnel. Worse in the last month. NKI.  Right wrist pain - aching. R hand dominant.  Types a lot at work. Thumb and first two fingers feel numb in the tips. Wakes her up at night. Wearing a brace for day and night.  Pain management doc and rheumatologist thought it was probably carpal tunnel.  Taking ibuprofen without much relief. Trying ice too.   Nerve conduction test about 20 years ago showed RA, but no signs of CTS.   Past Medical History:  Diagnosis Date   Allergy    Complication of anesthesia    bladder " froze' years ago per pt    Depression    Elevated LDL cholesterol level    Endometrial polyp 2021   Exercise-induced asthma    pt denies at 4/20/visit    History of kidney stones    Kidney stones    Pre-diabetes    Prediabetes    Rheumatoid arthritis (HCC)    Right ovarian cyst 2021   Sleep apnea    hx of had surgery to correct    Urinary incontinence    Vitamin D deficiency     Past Surgical History:  Procedure Laterality Date   COLONOSCOPY WITH PROPOFOL N/A 08/24/2022   Procedure: COLONOSCOPY WITH PROPOFOL;  Surgeon: Jenel Lucks, MD;  Location: [facility];  Service: Gastroenterology;  Laterality: N/A;   CYSTOSCOPY N/A 05/14/2019   Procedure: CYSTOSCOPY;  Surgeon: Patton Salles, MD;  Location: [facility];  Service: Gynecology;  Laterality: N/A;   GALLBLADDER SURGERY     KIDNEY STONE SURGERY     OVARIAN CYST REMOVAL Right 05/14/2019   Procedure: Excision  RIGHT Para Tubal CYST;  Surgeon: Patton Salles, MD;  Location: [facility];  Service: Gynecology;  Laterality: Right;  right adnexal cyst removal   PALATE / UVULA BIOPSY / EXCISION     shoulder surgery      TONSILLECTOMY     TOTAL LAPAROSCOPIC HYSTERECTOMY WITH SALPINGECTOMY N/A 05/14/2019   Procedure: TOTAL LAPAROSCOPIC HYSTERECTOMY WITH SALPINGECTOMY, Pelvic Washings;  Surgeon: Patton Salles, MD;  Location: [facility];  Service: Gynecology;  Laterality: N/A;    Family History  Problem Relation Age of Onset   Diabetes Mother    Hypothyroidism Mother    Heart Problems Mother        pacemaker    Cancer Mother        endometrial ca?   Thyroid disease Mother    Hypertension Father    Diabetes Father    Non-Hodgkin's lymphoma Paternal Grandmother    Esophageal cancer Neg Hx    Rectal cancer Neg Hx    Stomach cancer Neg Hx    Colon polyps Neg Hx    Colon cancer Neg Hx     Social History   Tobacco Use   Smoking status: Former    Current packs/day: 0.00  Average packs/day: 1 pack/day for 16.0 years (16.0 ttl pk-yrs)    Types: Cigarettes    Start date: 44    Quit date: 2002    Years since quitting: 22.8   Smokeless tobacco: Never  Vaping Use   Vaping status: Never Used  Substance Use Topics   Alcohol use: Yes    Alcohol/week: 2.0 standard drinks of alcohol    Types: 1 Glasses of wine, 1 Cans of beer per week    Comment: Occ   Drug use: Never     No Known Allergies  Review of Systems NEGATIVE UNLESS OTHERWISE INDICATED IN HPI      Objective:     BP 122/72 (BP Location: Left Arm, Patient Position: Sitting)   Pulse 95   Temp 98.2 F (36.8 C) (Temporal)   Ht 5\' 3"  (1.6 m)   Wt 230 lb 6.4 oz (104.5 kg)   LMP 04/19/2019   SpO2 97%   BMI 40.81 kg/m   Wt Readings from Last 3 Encounters:  11/12/22 230 lb 6.4 oz (104.5 kg)  08/24/22 227 lb 11.8 oz (103.3 kg)  08/02/22 225 lb (102.1 kg)    BP Readings  from Last 3 Encounters:  11/12/22 122/72  09/01/22 119/78  08/24/22 131/82     Physical Exam Vitals and nursing note reviewed.  Constitutional:      Appearance: Normal appearance.  Musculoskeletal:     Right wrist: Swelling (+ dryness, psoriasis worse on R > L) and tenderness present. No bony tenderness or snuff box tenderness. Normal range of motion. Normal pulse.     Left wrist: No tenderness, bony tenderness or snuff box tenderness. Normal range of motion. Normal pulse.     Comments: +Tinel and Phalen's R wrist   Neurological:     General: No focal deficit present.     Mental Status: She is alert and oriented to person, place, and time.  Psychiatric:        Mood and Affect: Mood normal.        Behavior: Behavior normal.        Assessment & Plan:  Carpal tunnel syndrome of right wrist -     Ambulatory referral to Orthopedic Surgery -     predniSONE; Take one tablet po qd x 5 days with food.  Dispense: 5 tablet; Refill: 0  Immunization due -     Flu vaccine trivalent PF, 6mos and older(Flulaval,Afluria,Fluarix,Fluzone)   Discussed options with pt at this time. Offered EMG test. Pt requested we go ahead with referral to ortho surg at this time. Prednisone as directed to help with pain and inflammation. Brace / ice as tolerated.     Return if symptoms worsen or fail to improve.    Elasia Furnish M Sakia Schrimpf, PA-C

## 2022-12-01 ENCOUNTER — Ambulatory Visit: Payer: Managed Care, Other (non HMO) | Admitting: Orthopedic Surgery

## 2022-12-01 DIAGNOSIS — G5601 Carpal tunnel syndrome, right upper limb: Secondary | ICD-10-CM

## 2022-12-01 DIAGNOSIS — R200 Anesthesia of skin: Secondary | ICD-10-CM | POA: Diagnosis not present

## 2022-12-03 NOTE — Progress Notes (Unsigned)
Office Visit Note   Patient: Olivia Sims           Date of Birth: 09-May-1970           MRN: [id number] Visit Date: 12/01/2022 Requested by: Allwardt, Crist Infante, PA-C [address] [place],  [place] [address] PCP: Bary Leriche, PA-C  Subjective: Chief Complaint  Patient presents with   Other     Right hand numbness    HPI: Olivia Sims is a 52 y.o. female who presents to the office reporting right hand numbness and tingling.  Patient states "I think I have carpal tunnel syndrome".  Symptoms ongoing for 2 months.  Denies any neck pain.  Does report radicular pain to the elbow.  Pain does wake her from sleep at night with numbness and tingling.  She is right-hand dominant.  Describes decreased strength and decreased grip strength.  Also reports some forearm aching.  Wakes her from sleep at night 5 nights out of 7.  Did have a 5-day prednisone course which helped but the symptoms have now recurred.  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  Left hand is fine with no problems.  She is right-hand dominant.  Computer work is also painful.  Note is also made of her history of rheumatoid arthritis for which she takes methotrexate as well as biologic infusion.  Numbness and tingling is restricted to digits 1-3.Marland Kitchen                ROS: All systems reviewed are negative as they relate to the chief complaint within the history of present illness.  Patient denies fevers or chills.  Assessment & Plan: Visit Diagnoses:  1. Numbness of right hand   2. Carpal tunnel syndrome, right upper limb     Plan: Impression is carpal tunnel syndrome.  Plan is nerve conduction study to evaluate.  She may require carpal tunnel release in the future.  Nonoperative measures would include possible injection versus splint usage.  I think in the face of severe carpal tunnel syndrome however those interventions are less likely to affect a meaningful change in clinical course.  We will see the how bad  this is electrically with the nerve study.  Follow-Up Instructions: No follow-ups on file.   Orders:  Orders Placed This Encounter  Procedures   Ambulatory referral to Physical Medicine Rehab   No orders of the defined types were placed in this encounter.     Procedures: No procedures performed   Clinical Data: No additional findings.  Objective: Vital Signs: LMP 04/19/2019   Physical Exam:  Constitutional: Patient appears well-developed HEENT:  Head: Normocephalic Eyes:EOM are normal Neck: Normal range of motion Cardiovascular: Normal rate Pulmonary/chest: Effort normal Neurologic: Patient is alert Skin: Skin is warm Psychiatric: Patient has normal mood and affect  Ortho Exam: Ortho exam demonstrates 5 out of 5 grip EPL FPL interosseous are/extension biceps triceps deltoid strength.  No Tinel's over ulnar nerve instability at the right elbow.  Does have palmar more than dorsal paresthesias on the right hand on that radial side.  Radial pulses intact.  Wrist range of motion is full.  No tenderness over the radial styloid.  Specialty Comments:  No specialty comments available.  Imaging: No results found.   PMFS History: Patient Active Problem List   Diagnosis Date Noted   Colon cancer screening 08/24/2022   Encounter for annual physical exam 11/23/2021   Bilateral leg edema 11/23/2021   Seborrheic  keratoses 11/19/2021   Generalized anxiety disorder 11/19/2021   Major depressive disorder, recurrent episode, moderate (HCC) 11/19/2021   History of COVID-19 10/29/2019   Status post laparoscopic hysterectomy 05/14/2019   Elevated LDL cholesterol level    Obesity (BMI 30-39.9) 01/08/2019   Prediabetes 01/08/2019   Nail deformity 01/08/2019   Vitamin D deficiency 01/08/2019   Exercise-induced asthma    Rheumatoid arthritis (HCC)    Kidney stones    Depression    Past Medical History:  Diagnosis Date   Allergy    Complication of anesthesia    bladder "  froze' years ago per pt    Depression    Elevated LDL cholesterol level    Endometrial polyp 2021   Exercise-induced asthma    pt denies at 4/20/visit    History of kidney stones    Kidney stones    Pre-diabetes    Prediabetes    Rheumatoid arthritis (HCC)    Right ovarian cyst 2021   Sleep apnea    hx of had surgery to correct    Urinary incontinence    Vitamin D deficiency     Family History  Problem Relation Age of Onset   Diabetes Mother    Hypothyroidism Mother    Heart Problems Mother        pacemaker    Cancer Mother        endometrial ca?   Thyroid disease Mother    Hypertension Father    Diabetes Father    Non-Hodgkin's lymphoma Paternal Grandmother    Esophageal cancer Neg Hx    Rectal cancer Neg Hx    Stomach cancer Neg Hx    Colon polyps Neg Hx    Colon cancer Neg Hx     Past Surgical History:  Procedure Laterality Date   COLONOSCOPY WITH PROPOFOL N/A 08/24/2022   Procedure: COLONOSCOPY WITH PROPOFOL;  Surgeon: Jenel Lucks, MD;  Location: [facility];  Service: Gastroenterology;  Laterality: N/A;   CYSTOSCOPY N/A 05/14/2019   Procedure: CYSTOSCOPY;  Surgeon: Patton Salles, MD;  Location: [facility];  Service: Gynecology;  Laterality: N/A;   GALLBLADDER SURGERY     KIDNEY STONE SURGERY     OVARIAN CYST REMOVAL Right 05/14/2019   Procedure: Excision RIGHT Para Tubal CYST;  Surgeon: Patton Salles, MD;  Location: [facility];  Service: Gynecology;  Laterality: Right;  right adnexal cyst removal   PALATE / UVULA BIOPSY / EXCISION     shoulder surgery      TONSILLECTOMY     TOTAL LAPAROSCOPIC HYSTERECTOMY WITH SALPINGECTOMY N/A 05/14/2019   Procedure: TOTAL LAPAROSCOPIC HYSTERECTOMY WITH SALPINGECTOMY, Pelvic Washings;  Surgeon: Patton Salles, MD;  Location: [facility];  Service: Gynecology;  Laterality: N/A;   Social History   Occupational History   Not on file  Tobacco  Use   Smoking status: Former    Current packs/day: 0.00    Average packs/day: 1 pack/day for 16.0 years (16.0 ttl pk-yrs)    Types: Cigarettes    Start date: 55    Quit date: 2002    Years since quitting: 22.8   Smokeless tobacco: Never  Vaping Use   Vaping status: Never Used  Substance and Sexual Activity   Alcohol use: Yes    Alcohol/week: 2.0 standard drinks of alcohol    Types: 1 Glasses of wine, 1 Cans of beer per week    Comment: Occ  Drug use: Never   Sexual activity: Not Currently    Birth control/protection: Abstinence

## 2022-12-05 ENCOUNTER — Encounter: Payer: Self-pay | Admitting: Orthopedic Surgery

## 2022-12-08 ENCOUNTER — Telehealth: Payer: Self-pay

## 2022-12-08 ENCOUNTER — Ambulatory Visit (INDEPENDENT_AMBULATORY_CARE_PROVIDER_SITE_OTHER): Payer: Managed Care, Other (non HMO) | Admitting: Physical Medicine and Rehabilitation

## 2022-12-08 DIAGNOSIS — M79641 Pain in right hand: Secondary | ICD-10-CM | POA: Diagnosis not present

## 2022-12-08 DIAGNOSIS — R29898 Other symptoms and signs involving the musculoskeletal system: Secondary | ICD-10-CM | POA: Diagnosis not present

## 2022-12-08 DIAGNOSIS — R202 Paresthesia of skin: Secondary | ICD-10-CM | POA: Diagnosis not present

## 2022-12-08 NOTE — Procedures (Signed)
EMG & NCV Findings: Evaluation of the right median motor nerve showed prolonged distal onset latency (5.0 ms), reduced amplitude (4.2 mV), and decreased conduction velocity (Elbow-Wrist, 47 m/s).  The right median (across palm) sensory nerve showed prolonged distal peak latency (Wrist, 4.8 ms).  All remaining nerves (as indicated in the following tables) were within normal limits.    All examined muscles (as indicated in the following table) showed no evidence of electrical instability.    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   There is no significant electrodiagnostic evidence of any other focal nerve entrapment, brachial plexopathy or cervical radiculopathy.   Recommendations: 1.  Follow-up with referring physician. 2.  Continue current management of symptoms. 3.  Continue use of resting splint at night-time and as needed during the day. 4.  Suggest surgical evaluation.  ___________________________ Naaman Plummer FAAPMR Board Certified, American Board of Physical Medicine and Rehabilitation    Nerve Conduction Studies Anti Sensory Summary Table   Stim Site NR Peak (ms) Norm Peak (ms) P-T Amp (V) Norm P-T Amp Site1 Site2 Delta-P (ms) Dist (cm) Vel (m/s) Norm Vel (m/s)  Right Median Acr Palm Anti Sensory (2nd Digit)  30.8C  Wrist    *4.8 <3.6 15.1 >10 Wrist Palm 3.0 0.0    Palm    1.8 <2.0 5.1         Right Radial Anti Sensory (Base 1st Digit)  30.9C  Wrist    2.0 <3.1 30.3  Wrist Base 1st Digit 2.0 0.0    Right Ulnar Anti Sensory (5th Digit)  31.3C  Wrist    3.1 <3.7 19.4 >15.0 Wrist 5th Digit 3.1 14.0 45 >38   Motor Summary Table   Stim Site NR Onset (ms) Norm Onset (ms) O-P Amp (mV) Norm O-P Amp Site1 Site2 Delta-0 (ms) Dist (cm) Vel (m/s) Norm Vel (m/s)  Right Median Motor (Abd Poll Brev)  31C  Wrist     *5.0 <4.2 *4.2 >5 Elbow Wrist 3.8 18.0 *47 >50  Elbow    8.8  4.9         Right Ulnar Motor (Abd Dig Min)  31.2C  Wrist    2.9 <4.2 11.3 >3 B Elbow Wrist 3.0 17.5 58 >53  B Elbow    5.9  11.4  A Elbow B Elbow 1.2 10.0 83 >53  A Elbow    7.1  10.0          EMG   Side Muscle Nerve Root Ins Act Fibs Psw Amp Dur Poly Recrt Int Dennie Bible Comment  Right Abd Poll Brev Median C8-T1 Nml Nml Nml Nml Nml 0 Nml Nml   Right 1stDorInt Ulnar C8-T1 Nml Nml Nml Nml Nml 0 Nml Nml   Right PronatorTeres Median C6-7 Nml Nml Nml Nml Nml 0 Nml Nml   Right Biceps Musculocut C5-6 Nml Nml Nml Nml Nml 0 Nml Nml   Right Deltoid Axillary C5-6 Nml Nml Nml Nml Nml 0 Nml Nml     Nerve Conduction Studies Anti Sensory Left/Right Comparison   Stim Site L Lat (ms) R Lat (ms) L-R Lat (ms) L Amp (V) R Amp (V) L-R Amp (%) Site1 Site2 L Vel (m/s) R Vel (m/s) L-R Vel (m/s)  Median Acr Palm Anti Sensory (2nd Digit)  30.8C  Wrist  *4.8   15.1  Wrist [employer]  1.8   5.1        Radial Anti Sensory (Base 1st Digit)  30.9C  Wrist  2.0   30.3  Wrist Base 1st Digit     Ulnar Anti Sensory (5th Digit)  31.3C  Wrist  3.1   19.4  Wrist 5th Digit  45    Motor Left/Right Comparison   Stim Site L Lat (ms) R Lat (ms) L-R Lat (ms) L Amp (mV) R Amp (mV) L-R Amp (%) Site1 Site2 L Vel (m/s) R Vel (m/s) L-R Vel (m/s)  Median Motor (Abd Poll Brev)  31C  Wrist  *5.0   *4.2  Elbow Wrist  *47   Elbow  8.8   4.9        Ulnar Motor (Abd Dig Min)  31.2C  Wrist  2.9   11.3  B Elbow Wrist  58   B Elbow  5.9   11.4  A Elbow B Elbow  83   A Elbow  7.1   10.0           Waveforms:

## 2022-12-08 NOTE — Progress Notes (Signed)
Olivia Sims - 52 y.o. female MRN [id number]  Date of birth: [birth year]  Office Visit Note: Visit Date: 12/08/2022 PCP: Bary Leriche, PA-C Referred by: Cammy Copa, MD  Subjective: Chief Complaint  Patient presents with   Right Hand - Pain, Numbness, Tingling   HPI: Olivia Sims is a 52 y.o. female who comes in today at the request of Dr. Burnard Bunting for evaluation and management of chronic, worsening and severe pain, numbness and tingling in the Right upper extremities.  Patient is Right hand dominant.  She comes today with Argie Ramming her sister who works in the office.  Patient reports that she has been having ongoing progressively worsening symptoms over the last 2 to 3 months.  She reports off-and-on symptoms for even longer.  She reports pain numbness and tingling into the hand particular radial digits.  She does not have any elbow pain or forearm pain although the pain can radiate up into those areas.  She denies any specific neck pain or frank radicular symptoms.  She reports that it does awaken her at night most nights.  She really cannot get a lot of sleep at this point.  She reports at its worst at work which she has a pretty physical job and it is also worse with working at the computer.  Her case is complicated by prediabetes, rheumatoid arthritis and psoriatic symptoms.  She is followed by Dr. Alben Deeds.  She has tried bracing as well as medications without much in the way of relief.  She has had prednisone which did help temporarily.   I spent more than 30 minutes speaking face-to-face with the patient with 50% of the time in counseling and discussing coordination of care.        Review of Systems  Musculoskeletal:  Positive for joint pain.  Neurological:  Positive for tingling and focal weakness.  All other systems reviewed and are negative.  Otherwise per HPI.  Assessment & Plan: Visit Diagnoses:    ICD-10-CM   1. Paresthesia of skin  R20.2 NCV  with EMG (electromyography)    2. Pain in right hand  M79.641     3. Right hand weakness  R29.898        Plan: Impression: Chronic off-and-on history of pain numbness and tingling in approximately median nerve distribution and now with several months of severe worsening to almost constant symptoms.  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  Electrodiagnostic study performed.  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   There is no significant electrodiagnostic evidence of any other focal nerve entrapment, brachial plexopathy or cervical radiculopathy.   Recommendations: 1.  Follow-up with referring physician. 2.  Continue current management of symptoms. 3.  Continue use of resting splint at night-time and as needed during the day. 4.  Suggest surgical evaluation.  Meds & Orders: No orders of the defined types were placed in this encounter.   Orders Placed This Encounter  Procedures   NCV with EMG (electromyography)    Follow-up: Return for  G. Dorene Grebe, MD.   Procedures: No procedures performed  EMG & NCV Findings: Evaluation of the right median motor nerve showed prolonged distal onset latency (5.0 ms), reduced amplitude (4.2 mV), and decreased conduction velocity (Elbow-Wrist, 47 m/s).  The right median (across palm) sensory nerve showed prolonged distal  peak latency (Wrist, 4.8 ms).  All remaining nerves (as indicated in the following tables) were within normal limits.    All examined muscles (as indicated in the following table) showed no evidence of electrical instability.    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   There is no significant  electrodiagnostic evidence of any other focal nerve entrapment, brachial plexopathy or cervical radiculopathy.   Recommendations: 1.  Follow-up with referring physician. 2.  Continue current management of symptoms. 3.  Continue use of resting splint at night-time and as needed during the day. 4.  Suggest surgical evaluation.  ___________________________ Naaman Plummer FAAPMR Board Certified, American Board of Physical Medicine and Rehabilitation    Nerve Conduction Studies Anti Sensory Summary Table   Stim Site NR Peak (ms) Norm Peak (ms) P-T Amp (V) Norm P-T Amp Site1 Site2 Delta-P (ms) Dist (cm) Vel (m/s) Norm Vel (m/s)  Right Median Acr Palm Anti Sensory (2nd Digit)  30.8C  Wrist    *4.8 <3.6 15.1 >10 Wrist Palm 3.0 0.0    Palm    1.8 <2.0 5.1         Right Radial Anti Sensory (Base 1st Digit)  30.9C  Wrist    2.0 <3.1 30.3  Wrist Base 1st Digit 2.0 0.0    Right Ulnar Anti Sensory (5th Digit)  31.3C  Wrist    3.1 <3.7 19.4 >15.0 Wrist 5th Digit 3.1 14.0 45 >38   Motor Summary Table   Stim Site NR Onset (ms) Norm Onset (ms) O-P Amp (mV) Norm O-P Amp Site1 Site2 Delta-0 (ms) Dist (cm) Vel (m/s) Norm Vel (m/s)  Right Median Motor (Abd Poll Brev)  31C  Wrist    *5.0 <4.2 *4.2 >5 Elbow Wrist 3.8 18.0 *47 >50  Elbow    8.8  4.9         Right Ulnar Motor (Abd Dig Min)  31.2C  Wrist    2.9 <4.2 11.3 >3 B Elbow Wrist 3.0 17.5 58 >53  B Elbow    5.9  11.4  A Elbow B Elbow 1.2 10.0 83 >53  A Elbow    7.1  10.0          EMG   Side Muscle Nerve Root Ins Act Fibs Psw Amp Dur Poly Recrt Int Dennie Bible Comment  Right Abd Poll Brev Median C8-T1 Nml Nml Nml Nml Nml 0 Nml Nml   Right 1stDorInt Ulnar C8-T1 Nml Nml Nml Nml Nml 0 Nml Nml   Right PronatorTeres Median C6-7 Nml Nml Nml Nml Nml 0 Nml Nml   Right Biceps Musculocut C5-6 Nml Nml Nml Nml Nml 0 Nml Nml   Right Deltoid Axillary C5-6 Nml Nml Nml Nml Nml 0 Nml Nml     Nerve Conduction Studies Anti Sensory Left/Right Comparison   Stim  Site L Lat (ms) R Lat (ms) L-R Lat (ms) L Amp (V) R Amp (V) L-R Amp (%) Site1 Site2 L Vel (m/s) R Vel (m/s) L-R Vel (m/s)  Median Acr Palm Anti Sensory (2nd Digit)  30.8C  Wrist  *4.8   15.1  Wrist Palm     Palm  1.8   5.1        Radial Anti Sensory (Base 1st Digit)  30.9C  Wrist  2.0   30.3  Wrist Base 1st Digit     Ulnar Anti Sensory (  5th Digit)  31.3C  Wrist  3.1   19.4  Wrist 5th Digit  45    Motor Left/Right Comparison   Stim Site L Lat (ms) R Lat (ms) L-R Lat (ms) L Amp (mV) R Amp (mV) L-R Amp (%) Site1 Site2 L Vel (m/s) R Vel (m/s) L-R Vel (m/s)  Median Motor (Abd Poll Brev)  31C  Wrist  *5.0   *4.2  Elbow Wrist  *47   Elbow  8.8   4.9        Ulnar Motor (Abd Dig Min)  31.2C  Wrist  2.9   11.3  B Elbow Wrist  58   B Elbow  5.9   11.4  A Elbow B Elbow  83   A Elbow  7.1   10.0           Waveforms:             Clinical History: No specialty comments available.   She reports that she quit smoking about 22 years ago. Her smoking use included cigarettes. She started smoking about 38 years ago. She has a 16 pack-year smoking history. She has never used smokeless tobacco.  Recent Labs    05/20/22 1426  HGBA1C 5.9*    Objective:  VS:  HT:    WT:   BMI:     BP:   HR: bpm  TEMP: ( )  RESP:  Physical Exam Vitals and nursing note reviewed.  Constitutional:      General: She is not in acute distress.    Appearance: Normal appearance. She is well-developed. She is obese. She is not ill-appearing.  HENT:     Head: Normocephalic and atraumatic.  Eyes:     Conjunctiva/sclera: Conjunctivae normal.     Pupils: Pupils are equal, round, and reactive to light.  Cardiovascular:     Rate and Rhythm: Normal rate.     Pulses: Normal pulses.  Pulmonary:     Effort: Pulmonary effort is normal.  Musculoskeletal:        General: No swelling, tenderness or deformity.     Right lower leg: No edema.     Left lower leg: No edema.     Comments: Inspection reveals mild  flattening of the right APB but no atrophy of the bilateral  FDI or hand intrinsics. There is no swelling, color changes, allodynia or dystrophic changes. There is 5 out of 5 strength in the bilateral wrist extension, finger abduction and long finger flexion.  There is decreased sensation to light touch in the right median nerve distribution. There is a positive Phalen's test on the right. There is a negative Hoffmann's test bilaterally.  Skin:    General: Skin is warm and dry.     Findings: Erythema and rash present.     Comments: Psoriatic changes  Neurological:     General: No focal deficit present.     Mental Status: She is alert and oriented to person, place, and time.     Sensory: No sensory deficit.     Motor: No weakness or abnormal muscle tone.     Coordination: Coordination normal.     Gait: Gait normal.  Psychiatric:        Mood and Affect: Mood normal.        Behavior: Behavior normal.     Ortho Exam  Imaging: No results found.  Past Medical/Family/Surgical/Social History: Medications & Allergies reviewed per EMR, new medications updated. Patient Active Problem List   Diagnosis  Date Noted   Colon cancer screening 08/24/2022   Encounter for annual physical exam 11/23/2021   Bilateral leg edema 11/23/2021   Seborrheic keratoses 11/19/2021   Generalized anxiety disorder 11/19/2021   Major depressive disorder, recurrent episode, moderate (HCC) 11/19/2021   History of COVID-19 10/29/2019   Status post laparoscopic hysterectomy 05/14/2019   Elevated LDL cholesterol level    Obesity (BMI 30-39.9) 01/08/2019   Prediabetes 01/08/2019   Nail deformity 01/08/2019   Vitamin D deficiency 01/08/2019   Exercise-induced asthma    Rheumatoid arthritis (HCC)    Kidney stones    Depression    Past Medical History:  Diagnosis Date   Allergy    Complication of anesthesia    bladder " froze' years ago per pt    Depression    Elevated LDL cholesterol level    Endometrial polyp  2021   Exercise-induced asthma    pt denies at 4/20/visit    History of kidney stones    Kidney stones    Pre-diabetes    Prediabetes    Rheumatoid arthritis (HCC)    Right ovarian cyst 2021   Sleep apnea    hx of had surgery to correct    Urinary incontinence    Vitamin D deficiency    Family History  Problem Relation Age of Onset   Diabetes Mother    Hypothyroidism Mother    Heart Problems Mother        pacemaker    Cancer Mother        endometrial ca?   Thyroid disease Mother    Hypertension Father    Diabetes Father    Non-Hodgkin's lymphoma Paternal Grandmother    Esophageal cancer Neg Hx    Rectal cancer Neg Hx    Stomach cancer Neg Hx    Colon polyps Neg Hx    Colon cancer Neg Hx    Past Surgical History:  Procedure Laterality Date   COLONOSCOPY WITH PROPOFOL N/A 08/24/2022   Procedure: COLONOSCOPY WITH PROPOFOL;  Surgeon: Jenel Lucks, MD;  Location: [facility];  Service: Gastroenterology;  Laterality: N/A;   CYSTOSCOPY N/A 05/14/2019   Procedure: CYSTOSCOPY;  Surgeon: Patton Salles, MD;  Location: [facility];  Service: Gynecology;  Laterality: N/A;   GALLBLADDER SURGERY     KIDNEY STONE SURGERY     OVARIAN CYST REMOVAL Right 05/14/2019   Procedure: Excision RIGHT Para Tubal CYST;  Surgeon: Patton Salles, MD;  Location: [facility];  Service: Gynecology;  Laterality: Right;  right adnexal cyst removal   PALATE / UVULA BIOPSY / EXCISION     shoulder surgery      TONSILLECTOMY     TOTAL LAPAROSCOPIC HYSTERECTOMY WITH SALPINGECTOMY N/A 05/14/2019   Procedure: TOTAL LAPAROSCOPIC HYSTERECTOMY WITH SALPINGECTOMY, Pelvic Washings;  Surgeon: Patton Salles, MD;  Location: [facility];  Service: Gynecology;  Laterality: N/A;   Social History   Occupational History   Not on file  Tobacco Use   Smoking status: Former    Current packs/day: 0.00    Average packs/day: 1 pack/day for  16.0 years (16.0 ttl pk-yrs)    Types: Cigarettes    Start date: 41    Quit date: 2002    Years since quitting: 22.9   Smokeless tobacco: Never  Vaping Use   Vaping status: Never Used  Substance and Sexual Activity   Alcohol use: Yes    Alcohol/week: 2.0 standard drinks  of alcohol    Types: 1 Glasses of wine, 1 Cans of beer per week    Comment: Occ   Drug use: Never   Sexual activity: Not Currently    Birth control/protection: Abstinence

## 2022-12-08 NOTE — Progress Notes (Signed)
Functional Pain Scale - descriptive words and definitions  Uncomfortable (3)  Pain is present but can complete all ADL's/sleep is slightly affected and passive distraction only gives marginal relief. Mild range order  Average Pain 4  RUC NCS She has numbness and tingling in hand and first two fingers. Sometimes she has pain and numbness into elbow. This disturbs her sleeping. She has noticed a loss in strength in right hand with some difficultly grasping.

## 2022-12-08 NOTE — Telephone Encounter (Signed)
Lmom sheet done

## 2022-12-08 NOTE — Telephone Encounter (Signed)
Patient had EMG/NCV study performed today. Patient would like to be called with next steps and plan. Possibly proceed with surgery scheduling?

## 2022-12-09 ENCOUNTER — Ambulatory Visit: Payer: Managed Care, Other (non HMO) | Admitting: Physician Assistant

## 2022-12-14 ENCOUNTER — Encounter: Payer: Self-pay | Admitting: Physical Medicine and Rehabilitation

## 2022-12-28 ENCOUNTER — Encounter: Payer: Self-pay | Admitting: Physician Assistant

## 2022-12-28 NOTE — Telephone Encounter (Signed)
Please see pt message and advise 

## 2022-12-29 NOTE — Telephone Encounter (Signed)
Left patient vm to call back to get scheduled.  I have informed patient to ask for me.

## 2022-12-29 NOTE — Telephone Encounter (Signed)
Please see Alyssa message and add to schedule today or tomorrow if patient available to be seen.

## 2022-12-29 NOTE — Telephone Encounter (Signed)
Patient is scheduled for 12/12 at 11:15am.

## 2022-12-30 ENCOUNTER — Ambulatory Visit (INDEPENDENT_AMBULATORY_CARE_PROVIDER_SITE_OTHER): Payer: Managed Care, Other (non HMO) | Admitting: Physician Assistant

## 2022-12-30 ENCOUNTER — Encounter: Payer: Self-pay | Admitting: Physician Assistant

## 2022-12-30 VITALS — BP 120/76 | HR 92 | Temp 97.7°F | Ht 63.0 in | Wt 231.0 lb

## 2022-12-30 DIAGNOSIS — H900 Conductive hearing loss, bilateral: Secondary | ICD-10-CM | POA: Diagnosis not present

## 2022-12-30 DIAGNOSIS — H6993 Unspecified Eustachian tube disorder, bilateral: Secondary | ICD-10-CM

## 2022-12-30 MED ORDER — FLUTICASONE PROPIONATE 50 MCG/ACT NA SUSP: 2.0000 | Freq: Every day | NASAL | 3 refills | Status: AC

## 2022-12-30 MED ORDER — PSEUDOEPHEDRINE HCL 60 MG PO TABS: 60.0000 mg | ORAL_TABLET | ORAL | 0 refills | Status: AC | PRN

## 2022-12-30 MED ORDER — NEOMYCIN-POLYMYXIN-HC 3.5-10000-1 OT SOLN: 4.0000 [drp] | Freq: Four times a day (QID) | OTIC | 0 refills | Status: AC

## 2022-12-30 NOTE — Progress Notes (Signed)
Patient ID: Olivia Sims, female    DOB: 1970-10-02, 53 y.o.   MRN: [id number]   Assessment & Plan:  Conductive hearing loss, bilateral -     Fluticasone Propionate; Place 2 sprays into both nostrils daily.  Dispense: 16 g; Refill: 3 -     Pseudoephedrine HCl; Take 1 tablet (60 mg total) by mouth every 4 (four) hours as needed for up to 4 days for congestion.  Dispense: 30 tablet; Refill: 0 -     Neomycin-Polymyxin-HC; Place 4 drops into both ears 4 (four) times daily for 7 days.  Dispense: 10 mL; Refill: 0  Acute dysfunction of both eustachian tubes -     Fluticasone Propionate; Place 2 sprays into both nostrils daily.  Dispense: 16 g; Refill: 3 -     Pseudoephedrine HCl; Take 1 tablet (60 mg total) by mouth every 4 (four) hours as needed for up to 4 days for congestion.  Dispense: 30 tablet; Refill: 0 -     Neomycin-Polymyxin-HC; Place 4 drops into both ears 4 (four) times daily for 7 days.  Dispense: 10 mL; Refill: 0    Assessment and Plan    Acute Eustachian Tube Dysfunction   Bilateral hearing loss with no pain or drainage. Eardrums appear puffed out with fluid behind them. Currently on Augmentin and Amoxicillin. No systemic symptoms.   -Start Sudafed for 3-4 days.   -Continue Flonase if available at home.   -Start Cortisporin drops in both ears for 7 days.   -Use hot compresses and steam to facilitate drainage.   -Check progress by 01/03/2023. If no significant improvement, consider referral to ENT.    Upcoming Carpal Tunnel Surgery   Scheduled for 12/31/2022 with Dr. August Saucer.   -Continue current plan as it should not interfere with surgery.          Return if symptoms worsen or fail to improve.    Subjective:    Chief Complaint  Patient presents with   Ear Fullness    Pt in office unable to hear BIL ears, ears feel full; pt still has lingering cough but feels fine otherwise. One dose of antibiotics left, pt states unable to hear bell or phones at work and very  annoying    Ear Fullness    Discussed the use of AI scribe software for clinical note transcription with the patient, who gave verbal consent to proceed.  History of Present Illness   The patient presents with bilateral hearing loss, which she describes as feeling like she 'can't hear anything.' She denies any ear pain, which was previously severe, and any ear drainage. She has been taking Augmentin since 12/23/22, but has not been using any ear drops. She has been trying to drain her ears in the shower and has been using Afrin and saline for nasal congestion, but stopped the Afrin due to nosebleeds. She also reports a slight cough, but denies any chest congestion. She has a history of a perforated eardrum.       Past Medical History:  Diagnosis Date   Allergy    Complication of anesthesia    bladder " froze' years ago per pt    Depression    Elevated LDL cholesterol level    Endometrial polyp 2021   Exercise-induced asthma    pt denies at 4/20/visit    History of kidney stones    Kidney stones    Pre-diabetes    Prediabetes    Rheumatoid arthritis (HCC)  Right ovarian cyst 2021   Sleep apnea    hx of had surgery to correct    Urinary incontinence    Vitamin D deficiency     Past Surgical History:  Procedure Laterality Date   COLONOSCOPY WITH PROPOFOL N/A 08/24/2022   Procedure: COLONOSCOPY WITH PROPOFOL;  Surgeon: Jenel Lucks, MD;  Location: [facility];  Service: Gastroenterology;  Laterality: N/A;   CYSTOSCOPY N/A 05/14/2019   Procedure: CYSTOSCOPY;  Surgeon: Patton Salles, MD;  Location: [facility];  Service: Gynecology;  Laterality: N/A;   GALLBLADDER SURGERY     KIDNEY STONE SURGERY     OVARIAN CYST REMOVAL Right 05/14/2019   Procedure: Excision RIGHT Para Tubal CYST;  Surgeon: Patton Salles, MD;  Location: [facility];  Service: Gynecology;  Laterality: Right;  right adnexal cyst removal   PALATE / UVULA  BIOPSY / EXCISION     shoulder surgery      TONSILLECTOMY     TOTAL LAPAROSCOPIC HYSTERECTOMY WITH SALPINGECTOMY N/A 05/14/2019   Procedure: TOTAL LAPAROSCOPIC HYSTERECTOMY WITH SALPINGECTOMY, Pelvic Washings;  Surgeon: Patton Salles, MD;  Location: [facility];  Service: Gynecology;  Laterality: N/A;    Family History  Problem Relation Age of Onset   Diabetes Mother    Hypothyroidism Mother    Heart Problems Mother        pacemaker    Cancer Mother        endometrial ca?   Thyroid disease Mother    Hypertension Father    Diabetes Father    Non-Hodgkin's lymphoma Paternal Grandmother    Esophageal cancer Neg Hx    Rectal cancer Neg Hx    Stomach cancer Neg Hx    Colon polyps Neg Hx    Colon cancer Neg Hx     Social History   Tobacco Use   Smoking status: Former    Current packs/day: 0.00    Average packs/day: 1 pack/day for 16.0 years (16.0 ttl pk-yrs)    Types: Cigarettes    Start date: 48    Quit date: 2002    Years since quitting: 22.9   Smokeless tobacco: Never  Vaping Use   Vaping status: Never Used  Substance Use Topics   Alcohol use: Yes    Alcohol/week: 2.0 standard drinks of alcohol    Types: 1 Glasses of wine, 1 Cans of beer per week    Comment: Occ   Drug use: Never     No Known Allergies  Review of Systems NEGATIVE UNLESS OTHERWISE INDICATED IN HPI      Objective:     BP 120/76 (BP Location: Left Arm, Patient Position: Sitting, Cuff Size: Large)   Pulse 92   Temp 97.7 F (36.5 C) (Temporal)   Ht 5\' 3"  (1.6 m)   Wt 231 lb (104.8 kg)   LMP 04/19/2019   SpO2 97%   BMI 40.92 kg/m   Wt Readings from Last 3 Encounters:  12/30/22 231 lb (104.8 kg)  11/12/22 230 lb 6.4 oz (104.5 kg)  08/24/22 227 lb 11.8 oz (103.3 kg)    BP Readings from Last 3 Encounters:  12/30/22 120/76  11/12/22 122/72  09/01/22 119/78     Physical Exam Vitals and nursing note reviewed.  Constitutional:      General: She is not in  acute distress.    Appearance: Normal appearance. She is not ill-appearing.  HENT:     Head:  Normocephalic.     Right Ear: External ear normal. No drainage. There is no impacted cerumen. No mastoid tenderness. Tympanic membrane is bulging.     Left Ear: External ear normal. No drainage. There is no impacted cerumen. No mastoid tenderness. Tympanic membrane is bulging.     Ears:     Comments: Noted significant decrease in hearing bilaterally     Nose: No congestion.     Mouth/Throat:     Mouth: Mucous membranes are moist.     Pharynx: No oropharyngeal exudate or posterior oropharyngeal erythema.  Eyes:     Extraocular Movements: Extraocular movements intact.     Conjunctiva/sclera: Conjunctivae normal.     Pupils: Pupils are equal, round, and reactive to light.  Cardiovascular:     Rate and Rhythm: Normal rate and regular rhythm.     Pulses: Normal pulses.     Heart sounds: Normal heart sounds. No murmur heard. Pulmonary:     Effort: Pulmonary effort is normal. No respiratory distress.     Breath sounds: Normal breath sounds. No wheezing.  Musculoskeletal:     Cervical back: Normal range of motion.  Skin:    General: Skin is warm.  Neurological:     Mental Status: She is alert and oriented to person, place, and time.  Psychiatric:        Mood and Affect: Mood normal.        Behavior: Behavior normal.           Tylea Hise M Tivon Lemoine, PA-C

## 2022-12-31 ENCOUNTER — Other Ambulatory Visit: Payer: Self-pay | Admitting: Orthopedic Surgery

## 2022-12-31 ENCOUNTER — Telehealth: Payer: Self-pay | Admitting: Orthopedic Surgery

## 2022-12-31 DIAGNOSIS — G5601 Carpal tunnel syndrome, right upper limb: Secondary | ICD-10-CM | POA: Diagnosis not present

## 2022-12-31 MED ORDER — OXYCODONE HCL 5 MG PO CAPS
5.0000 mg | ORAL_CAPSULE | Freq: Four times a day (QID) | ORAL | 0 refills | Status: DC | PRN
Start: 2022-12-31 — End: 2023-09-13

## 2022-12-31 NOTE — Telephone Encounter (Signed)
[facility] called to make you aware of the last refill was 120 of the percocet oxycodone on 12/02/2022. That was when she received them. CB#[phone]

## 2022-12-31 NOTE — Telephone Encounter (Signed)
Okay thanks.  She takes a fairly regimented amount of pain medicine.  I was just going to give her that 15 because her regimented in [place] will not cover the pain that she is now having in her hand and based on her tolerance I do not think the added 15 should make it worse in terms of sedating her.

## 2023-01-03 ENCOUNTER — Encounter: Payer: Self-pay | Admitting: Orthopedic Surgery

## 2023-01-03 NOTE — Telephone Encounter (Signed)
Called and advised pharmacist about what Dr. August Saucer said

## 2023-01-03 NOTE — Telephone Encounter (Signed)
I replied to her

## 2023-01-07 ENCOUNTER — Encounter: Payer: Managed Care, Other (non HMO) | Admitting: Surgical

## 2023-01-14 ENCOUNTER — Ambulatory Visit (INDEPENDENT_AMBULATORY_CARE_PROVIDER_SITE_OTHER): Payer: Managed Care, Other (non HMO) | Admitting: Orthopedic Surgery

## 2023-01-14 ENCOUNTER — Encounter: Payer: Self-pay | Admitting: Orthopedic Surgery

## 2023-01-14 DIAGNOSIS — R202 Paresthesia of skin: Secondary | ICD-10-CM

## 2023-01-14 NOTE — Progress Notes (Signed)
Post-Op Visit Note   Patient: Olivia Sims           Date of Birth: Mar 22, 1970           MRN: [id number] Visit Date: 01/14/2023 PCP: Bary Leriche, PA-C   Assessment & Plan:  Chief Complaint:  Chief Complaint  Patient presents with   Right Hand - Routine Post Op    Right CTR 12/31/22   Visit Diagnoses:  1. Paresthesia of skin     Plan: Raqual is now 2 weeks out right carpal tunnel release.  On examination the incision is intact and sutures are removed today.  Abductor pollicis brevis strength is intact.  Paresthesias have resolved to a large degree.  She has been doing data entry for the past 2 weeks.  I think she should be limited in that regard for 2 more weeks and then when she returns to possible lifting wearing the splint will be a good idea.  Will see her back in 4 weeks for final clinical check.  Follow-Up Instructions: No follow-ups on file.   Orders:  No orders of the defined types were placed in this encounter.  No orders of the defined types were placed in this encounter.   Imaging: No results found.  PMFS History: Patient Active Problem List   Diagnosis Date Noted   Colon cancer screening 08/24/2022   Encounter for annual physical exam 11/23/2021   Bilateral leg edema 11/23/2021   Seborrheic keratoses 11/19/2021   Generalized anxiety disorder 11/19/2021   Major depressive disorder, recurrent episode, moderate (HCC) 11/19/2021   History of COVID-19 10/29/2019   Status post laparoscopic hysterectomy 05/14/2019   Elevated LDL cholesterol level    Obesity (BMI 30-39.9) 01/08/2019   Prediabetes 01/08/2019   Nail deformity 01/08/2019   Vitamin D deficiency 01/08/2019   Exercise-induced asthma    Rheumatoid arthritis (HCC)    Kidney stones    Depression    Past Medical History:  Diagnosis Date   Allergy    Complication of anesthesia    bladder " froze' years ago per pt    Depression    Elevated LDL cholesterol level    Endometrial polyp 2021    Exercise-induced asthma    pt denies at 4/20/visit    History of kidney stones    Kidney stones    Pre-diabetes    Prediabetes    Rheumatoid arthritis (HCC)    Right ovarian cyst 2021   Sleep apnea    hx of had surgery to correct    Urinary incontinence    Vitamin D deficiency     Family History  Problem Relation Age of Onset   Diabetes Mother    Hypothyroidism Mother    Heart Problems Mother        pacemaker    Cancer Mother        endometrial ca?   Thyroid disease Mother    Hypertension Father    Diabetes Father    Non-Hodgkin's lymphoma Paternal Grandmother    Esophageal cancer Neg Hx    Rectal cancer Neg Hx    Stomach cancer Neg Hx    Colon polyps Neg Hx    Colon cancer Neg Hx     Past Surgical History:  Procedure Laterality Date   COLONOSCOPY WITH PROPOFOL N/A 08/24/2022   Procedure: COLONOSCOPY WITH PROPOFOL;  Surgeon: Jenel Lucks, MD;  Location: [facility];  Service: Gastroenterology;  Laterality: N/A;   CYSTOSCOPY N/A 05/14/2019   Procedure: CYSTOSCOPY;  Surgeon: Patton Salles, MD;  Location: [facility];  Service: Gynecology;  Laterality: N/A;   GALLBLADDER SURGERY     KIDNEY STONE SURGERY     OVARIAN CYST REMOVAL Right 05/14/2019   Procedure: Excision RIGHT Para Tubal CYST;  Surgeon: Patton Salles, MD;  Location: [facility];  Service: Gynecology;  Laterality: Right;  right adnexal cyst removal   PALATE / UVULA BIOPSY / EXCISION     shoulder surgery      TONSILLECTOMY     TOTAL LAPAROSCOPIC HYSTERECTOMY WITH SALPINGECTOMY N/A 05/14/2019   Procedure: TOTAL LAPAROSCOPIC HYSTERECTOMY WITH SALPINGECTOMY, Pelvic Washings;  Surgeon: Patton Salles, MD;  Location: [facility];  Service: Gynecology;  Laterality: N/A;   Social History   Occupational History   Not on file  Tobacco Use   Smoking status: Former    Current packs/day: 0.00    Average packs/day: 1 pack/day for 16.0  years (16.0 ttl pk-yrs)    Types: Cigarettes    Start date: 53    Quit date: 2002    Years since quitting: 23.0   Smokeless tobacco: Never  Vaping Use   Vaping status: Never Used  Substance and Sexual Activity   Alcohol use: Yes    Alcohol/week: 2.0 standard drinks of alcohol    Types: 1 Glasses of wine, 1 Cans of beer per week    Comment: Occ   Drug use: Never   Sexual activity: Not Currently    Birth control/protection: Abstinence

## 2023-01-27 ENCOUNTER — Encounter: Payer: Self-pay | Admitting: Orthopedic Surgery

## 2023-01-27 NOTE — Telephone Encounter (Signed)
 Sure thx

## 2023-02-11 ENCOUNTER — Ambulatory Visit (INDEPENDENT_AMBULATORY_CARE_PROVIDER_SITE_OTHER): Payer: Managed Care, Other (non HMO) | Admitting: Surgical

## 2023-02-11 ENCOUNTER — Encounter: Payer: Self-pay | Admitting: Surgical

## 2023-02-11 DIAGNOSIS — Z9889 Other specified postprocedural states: Secondary | ICD-10-CM

## 2023-02-11 NOTE — Progress Notes (Signed)
Post-Op Visit Note   Patient: Olivia Sims           Date of Birth: Jul 29, 1970           MRN: [id number] Visit Date: 02/11/2023 PCP: Bary Leriche, PA-C   Assessment & Plan:  Chief Complaint:  Chief Complaint  Patient presents with   Right Wrist - Routine Post Op    Right CTR 12/31/22   Visit Diagnoses:  1. S/P carpal tunnel release     Plan: Patient is a 53 year old female who presents s/p right carpal tunnel release on 12/27/2022.  She is here for final follow-up.  She reports that she is doing well with no major complaints aside from some soreness around the incision when she puts pressure on it.  She has returned to work in full capacity and when she works she wears a splint in order to help with hand/wrist pain.  She states that overall compared with prior to surgery symptoms are significantly improved.  She no longer has numbness or tingling in her fingers and denies any symptoms that wake her from sleep at night.  She only has a little bit of residual numbness on the radial aspect of the incision in the palm and a small area.  On exam, patient has intact EPL, FPL, finger abduction.  Grip strength is 5 -/5 of the right hand compared with 5/5 of the left hand.  Incision is well-healed with no evidence of cellulitis or dehiscence.  Palpable radial pulse rated 2+.  Plan is follow-up with the office as needed.  She understands that grip strength and pillar pain may take about 4 months to resolve.  She will let us know if she has continued issues around that timeframe but otherwise follow-up as needed.  Follow-Up Instructions: No follow-ups on file.   Orders:  No orders of the defined types were placed in this encounter.  No orders of the defined types were placed in this encounter.   Imaging: No results found.  PMFS History: Patient Active Problem List   Diagnosis Date Noted   Colon cancer screening 08/24/2022   Encounter for annual physical exam 11/23/2021    Bilateral leg edema 11/23/2021   Seborrheic keratoses 11/19/2021   Generalized anxiety disorder 11/19/2021   Major depressive disorder, recurrent episode, moderate (HCC) 11/19/2021   History of COVID-19 10/29/2019   Status post laparoscopic hysterectomy 05/14/2019   Elevated LDL cholesterol level    Obesity (BMI 30-39.9) 01/08/2019   Prediabetes 01/08/2019   Nail deformity 01/08/2019   Vitamin D deficiency 01/08/2019   Exercise-induced asthma    Rheumatoid arthritis (HCC)    Kidney stones    Depression    Past Medical History:  Diagnosis Date   Allergy    Complication of anesthesia    bladder " froze' years ago per pt    Depression    Elevated LDL cholesterol level    Endometrial polyp 2021   Exercise-induced asthma    pt denies at 4/20/visit    History of kidney stones    Kidney stones    Pre-diabetes    Prediabetes    Rheumatoid arthritis (HCC)    Right ovarian cyst 2021   Sleep apnea    hx of had surgery to correct    Urinary incontinence    Vitamin D deficiency     Family History  Problem Relation Age of Onset   Diabetes Mother    Hypothyroidism Mother    Heart Problems Mother  pacemaker    Cancer Mother        endometrial ca?   Thyroid disease Mother    Hypertension Father    Diabetes Father    Non-Hodgkin's lymphoma Paternal Grandmother    Esophageal cancer Neg Hx    Rectal cancer Neg Hx    Stomach cancer Neg Hx    Colon polyps Neg Hx    Colon cancer Neg Hx     Past Surgical History:  Procedure Laterality Date   COLONOSCOPY WITH PROPOFOL N/A 08/24/2022   Procedure: COLONOSCOPY WITH PROPOFOL;  Surgeon: Jenel Lucks, MD;  Location: [facility];  Service: Gastroenterology;  Laterality: N/A;   CYSTOSCOPY N/A 05/14/2019   Procedure: CYSTOSCOPY;  Surgeon: Patton Salles, MD;  Location: [facility];  Service: Gynecology;  Laterality: N/A;   GALLBLADDER SURGERY     KIDNEY STONE SURGERY     OVARIAN CYST REMOVAL Right  05/14/2019   Procedure: Excision RIGHT Para Tubal CYST;  Surgeon: Patton Salles, MD;  Location: [facility];  Service: Gynecology;  Laterality: Right;  right adnexal cyst removal   PALATE / UVULA BIOPSY / EXCISION     shoulder surgery      TONSILLECTOMY     TOTAL LAPAROSCOPIC HYSTERECTOMY WITH SALPINGECTOMY N/A 05/14/2019   Procedure: TOTAL LAPAROSCOPIC HYSTERECTOMY WITH SALPINGECTOMY, Pelvic Washings;  Surgeon: Patton Salles, MD;  Location: [facility];  Service: Gynecology;  Laterality: N/A;   Social History   Occupational History   Not on file  Tobacco Use   Smoking status: Former    Current packs/day: 0.00    Average packs/day: 1 pack/day for 16.0 years (16.0 ttl pk-yrs)    Types: Cigarettes    Start date: 18    Quit date: 2002    Years since quitting: 23.0   Smokeless tobacco: Never  Vaping Use   Vaping status: Never Used  Substance and Sexual Activity   Alcohol use: Yes    Alcohol/week: 2.0 standard drinks of alcohol    Types: 1 Glasses of wine, 1 Cans of beer per week    Comment: Occ   Drug use: Never   Sexual activity: Not Currently    Birth control/protection: Abstinence

## 2023-02-23 ENCOUNTER — Encounter: Payer: Managed Care, Other (non HMO) | Admitting: Physician Assistant

## 2023-05-03 ENCOUNTER — Encounter: Payer: Managed Care, Other (non HMO) | Admitting: Physician Assistant

## 2023-07-29 ENCOUNTER — Other Ambulatory Visit: Payer: Self-pay | Admitting: Physician Assistant

## 2023-07-29 DIAGNOSIS — G4733 Obstructive sleep apnea (adult) (pediatric): Secondary | ICD-10-CM

## 2023-09-13 ENCOUNTER — Other Ambulatory Visit: Payer: Self-pay | Admitting: Physician Assistant

## 2023-09-13 ENCOUNTER — Encounter: Payer: Self-pay | Admitting: Physician Assistant

## 2023-09-13 ENCOUNTER — Ambulatory Visit (INDEPENDENT_AMBULATORY_CARE_PROVIDER_SITE_OTHER): Admitting: Physician Assistant

## 2023-09-13 VITALS — BP 126/82 | HR 75 | Temp 97.5°F | Ht 63.0 in | Wt 235.5 lb

## 2023-09-13 DIAGNOSIS — B359 Dermatophytosis, unspecified: Secondary | ICD-10-CM | POA: Insufficient documentation

## 2023-09-13 DIAGNOSIS — Z23 Encounter for immunization: Secondary | ICD-10-CM

## 2023-09-13 DIAGNOSIS — R94120 Abnormal auditory function study: Secondary | ICD-10-CM

## 2023-09-13 DIAGNOSIS — Z1231 Encounter for screening mammogram for malignant neoplasm of breast: Secondary | ICD-10-CM

## 2023-09-13 DIAGNOSIS — R7303 Prediabetes: Secondary | ICD-10-CM | POA: Diagnosis not present

## 2023-09-13 DIAGNOSIS — Z0000 Encounter for general adult medical examination without abnormal findings: Secondary | ICD-10-CM | POA: Diagnosis not present

## 2023-09-13 DIAGNOSIS — G4733 Obstructive sleep apnea (adult) (pediatric): Secondary | ICD-10-CM

## 2023-09-13 NOTE — Patient Instructions (Addendum)
 CALL: [phone] to schedule sleep study test  Your appointment for your screening mammogram at [facility] has been scheduled for September 28, 2023 at 3:40 pm. If you are unable to keep this appointment please call their office within 24 hours to avoid a $75.00 fee.

## 2023-09-13 NOTE — Progress Notes (Signed)
 Patient ID: Olivia Sims, female    DOB: [birth year], 53 y.o.   MRN: [id number]   Assessment & Plan:  Encounter for annual physical exam -     Comprehensive metabolic panel with GFR; Future -     Lipid panel; Future -     TSH; Future -     Hemoglobin A1c; Future -     FSH/LH; Future  Need for pneumococcal vaccine -     Pneumococcal conjugate vaccine 20-valent  Prediabetes -     Hemoglobin A1c; Future  Failed hearing screening -     Ambulatory referral to Audiology    Assessment & Plan Sleep Apnea Persistent tiredness and restlessness with mild sleep apnea. Previous uvula surgery was ineffective. Weight gain and allergies may contribute. Has not used CPAP before but is willing to try smaller devices despite comfort concerns. - Schedule sleep study to reassess sleep apnea - Discuss potential use of CPAP machine if sleep apnea is confirmed  Hearing Loss Trouble hearing, especially in the right ear, with a family history of hearing aid use. Fluid behind eardrums likely due to sinus congestion, no infection present. Willing to undergo formal audiology assessment. - Refer to audiology for formal hearing assessment - Consider use of Flonase  to alleviate sinus congestion and Eustachian tube blockage  General Health Maintenance Up to date with gynecological exams and colonoscopy. Needs assessment of menopausal status and monitoring for diabetes risk. Concerned about potential diabetes risk and motivated for weight management. - Order fasting labs including A1c and FSH/LH to assess menopausal status and diabetes risk - Schedule mammogram - Encourage regular exercise and weight management   Age-appropriate screening and counseling performed today. Will check labs and call with results. Preventive measures discussed and printed in AVS for patient.   Patient Counseling: [x]   Nutrition: Stressed importance of moderation in sodium/caffeine intake, saturated fat and cholesterol, caloric  balance, sufficient intake of fresh fruits, vegetables, and fiber.  [x]   Stressed the importance of regular exercise.   [x]   Substance Abuse: Discussed cessation/primary prevention of tobacco, alcohol, or other drug use; driving or other dangerous activities under the influence; availability of treatment for abuse.   []   Injury prevention: Discussed safety belts, safety helmets, smoke detector, smoking near bedding or upholstery.   []   Sexuality: Discussed sexually transmitted diseases, partner selection, use of condoms, avoidance of unintended pregnancy  and contraceptive alternatives.   [x]   Dental health: Discussed importance of regular tooth brushing, flossing, and dental visits.  [x]   Health maintenance and immunizations reviewed. Please refer to Health maintenance section.       Return in about 6 months (around 03/15/2024) for recheck/follow-up.    Subjective:    Chief Complaint  Patient presents with   Annual Exam    Pt in office for annual CPE and labs; pt not fasting today;     HPI Discussed the use of AI scribe software for clinical note transcription with the patient, who gave verbal consent to proceed.  History of Present Illness Olivia Sims is a 53 year old female presenting for annual CPE.  She experiences persistent fatigue and sleep disturbances, with a history of mild sleep apnea. She underwent surgery to remove her uvula, which she reports worked well for a while, but her symptoms have since returned. She has not used a CPAP machine and has not completed a recent sleep study due to referral issues. She experiences restlessness and difficulty staying asleep, waking up several times  a night. Her work schedule, which exceeds 50 hours a week, contributes to her fatigue.  She underwent carpal tunnel surgery, which alleviated her symptoms, although she still experiences some numbness. No significant issues with her hands post-surgery are reported.  She is currently on  oxycodone  for pain management but takes it sparingly due to work constraints. She also takes Lasix  for leg swelling, which causes frequent urination at night, further disrupting her sleep.  She has a history of hypertension, which is currently well-controlled. She also has a history of hysterectomy but retains her ovaries, leading to uncertainty about her menopausal status. She is concerned about her weight gain and its impact on her health, including her sleep apnea.  She reports issues with her ears, particularly the right one, causing hearing difficulties and occasional pain. She has a history of fluid buildup behind the eardrums and frequent earwax accumulation. She has not been formally assessed by audiology recently.  She has psoriasis, which she describes as a little bit out of control, but she notes some improvement in her nail beds. She is concerned about her cholesterol and A1c levels, as she is trying to avoid diabetes.  Her family history includes hearing loss, as her parents and sister use hearing aids. She is the only one in her immediate family who does not currently use them.     Past Medical History:  Diagnosis Date   Allergy    Complication of anesthesia    bladder  froze' years ago per pt    Depression    Elevated LDL cholesterol level    Endometrial polyp 2021   Exercise-induced asthma    pt denies at 4/20/visit    History of kidney stones    Kidney stones    Pre-diabetes    Prediabetes    Rheumatoid arthritis (HCC)    Right ovarian cyst 2021   Sleep apnea    hx of had surgery to correct    Urinary incontinence    Vitamin D  deficiency     Past Surgical History:  Procedure Laterality Date   COLONOSCOPY WITH PROPOFOL  N/A 08/24/2022   Procedure: COLONOSCOPY WITH PROPOFOL ;  Surgeon: Stacia Glendia BRAVO, MD;  Location: [facility];  Service: Gastroenterology;  Laterality: N/A;   CYSTOSCOPY N/A 05/14/2019   Procedure: CYSTOSCOPY;  Surgeon: Cathlyn JAYSON Nikki Bobie BRAVO, MD;  Location: [facility];  Service: Gynecology;  Laterality: N/A;   GALLBLADDER SURGERY     KIDNEY STONE SURGERY     OVARIAN CYST REMOVAL Right 05/14/2019   Procedure: Excision RIGHT Para Tubal CYST;  Surgeon: Cathlyn JAYSON Nikki Bobie BRAVO, MD;  Location: [facility];  Service: Gynecology;  Laterality: Right;  right adnexal cyst removal   PALATE / UVULA BIOPSY / EXCISION     shoulder surgery      TONSILLECTOMY     TOTAL LAPAROSCOPIC HYSTERECTOMY WITH SALPINGECTOMY N/A 05/14/2019   Procedure: TOTAL LAPAROSCOPIC HYSTERECTOMY WITH SALPINGECTOMY, Pelvic Washings;  Surgeon: Cathlyn JAYSON Nikki Bobie BRAVO, MD;  Location: [facility];  Service: Gynecology;  Laterality: N/A;    Family History  Problem Relation Age of Onset   Diabetes Mother    Hypothyroidism Mother    Heart Problems Mother        pacemaker    Cancer Mother        endometrial ca?   Thyroid disease Mother    Hypertension Father    Diabetes Father    Non-Hodgkin's lymphoma Paternal Grandmother  Esophageal cancer Neg Hx    Rectal cancer Neg Hx    Stomach cancer Neg Hx    Colon polyps Neg Hx    Colon cancer Neg Hx     Social History   Tobacco Use   Smoking status: Former    Current packs/day: 0.00    Average packs/day: 1 pack/day for 16.0 years (16.0 ttl pk-yrs)    Types: Cigarettes    Start date: 32    Quit date: 2002    Years since quitting: 23.6   Smokeless tobacco: Never  Vaping Use   Vaping status: Never Used  Substance Use Topics   Alcohol use: Yes    Alcohol/week: 2.0 standard drinks of alcohol    Types: 1 Glasses of wine, 1 Cans of beer per week    Comment: Occ   Drug use: Never     No Known Allergies  Review of Systems NEGATIVE UNLESS OTHERWISE INDICATED IN HPI      Objective:     BP 126/82 (BP Location: Left Arm, Patient Position: Sitting, Cuff Size: Large)   Pulse 75   Temp (!) 97.5 F (36.4 C) (Temporal)   Ht 5' 3 (1.6 m)   Wt 235 lb 8 oz  (106.8 kg)   LMP 04/19/2019   SpO2 98%   BMI 41.72 kg/m   Wt Readings from Last 3 Encounters:  09/13/23 235 lb 8 oz (106.8 kg)  12/30/22 231 lb (104.8 kg)  11/12/22 230 lb 6.4 oz (104.5 kg)    BP Readings from Last 3 Encounters:  09/13/23 126/82  12/30/22 120/76  11/12/22 122/72     Physical Exam Vitals and nursing note reviewed.  Constitutional:      Appearance: Normal appearance. She is obese. She is not toxic-appearing.  HENT:     Head: Normocephalic and atraumatic.     Right Ear: Ear canal and external ear normal. A middle ear effusion is present.     Left Ear: Ear canal and external ear normal. A middle ear effusion is present.     Nose: Nose normal.     Mouth/Throat:     Mouth: Mucous membranes are moist.  Eyes:     Extraocular Movements: Extraocular movements intact.     Conjunctiva/sclera: Conjunctivae normal.     Pupils: Pupils are equal, round, and reactive to light.  Cardiovascular:     Rate and Rhythm: Normal rate and regular rhythm.     Pulses: Normal pulses.     Heart sounds: Normal heart sounds.  Pulmonary:     Effort: Pulmonary effort is normal.     Breath sounds: Normal breath sounds.  Abdominal:     General: Abdomen is flat. Bowel sounds are normal.     Palpations: Abdomen is soft.  Musculoskeletal:        General: Normal range of motion.     Cervical back: Normal range of motion and neck supple.  Skin:    General: Skin is warm and dry.     Findings: Rash present.  Neurological:     General: No focal deficit present.     Mental Status: She is alert and oriented to person, place, and time.  Psychiatric:        Mood and Affect: Mood normal.        Behavior: Behavior normal.        Thought Content: Thought content normal.        Judgment: Judgment normal.  Marrah Vanevery M Price L[place]elle, PA-C

## 2023-09-20 ENCOUNTER — Other Ambulatory Visit (INDEPENDENT_AMBULATORY_CARE_PROVIDER_SITE_OTHER)

## 2023-09-20 DIAGNOSIS — Z0000 Encounter for general adult medical examination without abnormal findings: Secondary | ICD-10-CM

## 2023-09-20 DIAGNOSIS — R7303 Prediabetes: Secondary | ICD-10-CM | POA: Diagnosis not present

## 2023-09-21 ENCOUNTER — Ambulatory Visit: Payer: Self-pay | Admitting: Physician Assistant

## 2023-09-21 LAB — LIPID PANEL
Cholesterol: 185 mg/dL (ref 0–200)
HDL: 57.6 mg/dL (ref >39.00)
LDL Cholesterol: 112 mg/dL — ABNORMAL HIGH (ref 0–99)
NonHDL: 127.5
Total CHOL/HDL Ratio: 3
Triglycerides: 80 mg/dL (ref 0.0–149.0)
VLDL: 16 mg/dL (ref 0.0–40.0)

## 2023-09-21 LAB — COMPREHENSIVE METABOLIC PANEL WITH GFR
ALT: 31 U/L (ref 0–35)
AST: 23 U/L (ref 0–37)
Albumin: 4.4 g/dL (ref 3.5–5.2)
Alkaline Phosphatase: 79 U/L (ref 39–117)
BUN: 15 mg/dL (ref 6–23)
CO2: 27 meq/L (ref 19–32)
Calcium: 8.8 mg/dL (ref 8.4–10.5)
Chloride: 99 meq/L (ref 96–112)
Creatinine, Ser: 0.83 mg/dL (ref 0.40–1.20)
GFR: 79.97 mL/min (ref >60.00)
Glucose, Bld: 91 mg/dL (ref 70–99)
Potassium: 4.1 meq/L (ref 3.5–5.1)
Sodium: 135 meq/L (ref 135–145)
Total Bilirubin: 0.6 mg/dL (ref 0.2–1.2)
Total Protein: 7.8 g/dL (ref 6.0–8.3)

## 2023-09-21 LAB — HEMOGLOBIN A1C: Hgb A1c MFr Bld: 6.6 % — ABNORMAL HIGH (ref 4.6–6.5)

## 2023-09-21 LAB — TSH: TSH: 0.93 u[IU]/mL (ref 0.35–5.50)

## 2023-09-21 LAB — FSH/LH
FSH: 65.1 m[IU]/mL
LH: 47.1 m[IU]/mL

## 2023-09-22 NOTE — Telephone Encounter (Signed)
 Please see pt response to lab results as FYI

## 2023-09-26 ENCOUNTER — Encounter (HOSPITAL_BASED_OUTPATIENT_CLINIC_OR_DEPARTMENT_OTHER): Payer: Self-pay | Admitting: Internal Medicine

## 2023-09-26 DIAGNOSIS — R5383 Other fatigue: Secondary | ICD-10-CM

## 2023-09-26 DIAGNOSIS — G4700 Insomnia, unspecified: Secondary | ICD-10-CM

## 2023-09-26 DIAGNOSIS — R0683 Snoring: Secondary | ICD-10-CM

## 2023-09-28 ENCOUNTER — Ambulatory Visit
Admission: RE | Admit: 2023-09-28 | Discharge: 2023-09-28 | Disposition: A | Discharge status: Home / Self Care | Source: Ambulatory Visit | Attending: Physician Assistant | Admitting: Physician Assistant

## 2023-09-28 DIAGNOSIS — Z1231 Encounter for screening mammogram for malignant neoplasm of breast: Secondary | ICD-10-CM

## 2023-10-05 ENCOUNTER — Ambulatory Visit (HOSPITAL_BASED_OUTPATIENT_CLINIC_OR_DEPARTMENT_OTHER): Attending: Physical Medicine and Rehabilitation | Admitting: Internal Medicine

## 2023-10-05 DIAGNOSIS — G4733 Obstructive sleep apnea (adult) (pediatric): Secondary | ICD-10-CM | POA: Diagnosis present

## 2023-10-05 DIAGNOSIS — R5383 Other fatigue: Secondary | ICD-10-CM

## 2023-10-05 DIAGNOSIS — G4700 Insomnia, unspecified: Secondary | ICD-10-CM

## 2023-10-05 DIAGNOSIS — R0683 Snoring: Secondary | ICD-10-CM

## 2023-10-08 NOTE — Procedures (Signed)
 [facility] [address] [place], [place] [address] Tel: [phone]   Fax: [phone]  Home Sleep Test Interpretation  Patient Name: Olivia Sims, Olivia Sims Study Date: 10/05/2023  Date of Birth: [birth year] Study Type: HST  Age: 53 year MRN #: [id number]  Sex: Female Interpreting Physician: NEYSA RAMA, [id number]  Height: 5' 3 Referring Physician: DEBBY BEARD [phone])  Weight: 235.0 lbs Recording Tech: Will Poet RRT RPSGT RST  BMI: 41.6 Scoring Tech: Will Poet RRT RPSGT RST  ESS: 15 Neck Size: 14.5  %%startinterp%% %%startinterp%% Indications for Polysomnography The patient is a 53 year-old Female who is 5' 3 and weighs 235.0 lbs. Her BMI equals 41.6.  A home sleep apnea test was performed to evaluate for -.OSA  Medication  No Data.   Polysomnogram Data A home sleep test recorded the standard physiologic parameters including EKG, nasal and oral airflow.  Respiratory parameters of chest and abdominal movements were recorded with Respiratory Inductance Plethysmography belts.  Oxygen saturation was recorded by pulse oximetry.   Study Architecture The total recording time of the polysomnogram was 476.8 minutes.  The total monitoring time was 477.5 minutes.  Time spent in Supine position was 308.5 minutes.   Respiratory Events The study revealed a presence of 39 obstructive, 2 central, and - mixed apneas resulting in an Apnea index of 5.3 events per hour.  There were 64 hypopneas (>=3% desaturation and/or arousal) resulting in an Apnea\Hypopnea Index (AHI >=3% desaturation and/or arousal) of 13.3 events per hour.  There were 53 hypopneas (>=4% desaturation) resulting in an Apnea\Hypopnea Index (AHI >=4% desaturation) of 11.9 events per hour.  There were - Respiratory Effort Related Arousals resulting in a RERA index of - events per hour. The Respiratory Disturbance Index is 13.3 events per hour.  The snore index was - events per  hour.  Mean oxygen saturation was 96.1%.  The lowest oxygen saturation during monitoring time was 79.0%.  Time spent <=88% oxygen saturation was 7.3 minutes (1.5%).  Cardiac Summary The average pulse rate was 79.0 bpm.  The minimum pulse rate was 62.0 bpm while the maximum pulse rate was 120.0 bpm.  Cardiac rhythm was normal/abnormal.  Comments: Mild obstructive sleep apnea, AHI(3%) 13.3/hr. Snoring with oxygen desaturation to a nadir of 79%, mean 96.1%.  Diagnosis: Obstructive sleep apnea  Recommendations: Suggest autopap 5-15, CPAP titration sleep study or fitted oral appliance.   This study was personally reviewed and electronically signed by: Dr. RAMA JONETTA NEYSA Accredited Board Certified in Sleep Medicine Date/Time: 10/08/23   1:16    %%endinterp%% %%endinterp%%    Study Overview  Recording Time: 481.0 min. Monitoring Time: 477.5 min.  Analysis Start:  09:26:55 PM Supine Time: 308.5 min.  Analysis Stop:  05:23:40 AM     Study Summary   Count Index Longest Event Duration  Apneas & Hypopneas: 106 13.3  Apneas: 94.0 sec.     Hypopneas: 120.5 sec.  RERAs: - - - sec.  Desaturations: 88 11.1 121.8 sec.  Snores: - - - sec.    Minimum Oxygen Saturation: 79.0%    Respiratory Summary   Total Duration Supine Non-Supine   Count Index Average Longest Count Index Count Index  Obstructive Apnea 39 4.9 30.9 94.0 30 5.8 9 3.2   Mixed Apnea - - - - - - - -   Central Apnea 2 0.3 10.8 11.1 - - 2 0.7   Total Apneas 42 5.3 29.5 94.0 31 6.0 11 3.9  Hypopneas 3% 64 8.0 N.A. N.A. 15 2.9 49 17.4   Apneas & Hyp. 3% 106 13.3 N.A. N.A. 46 8.9 60 21.3            Hypopneas 4% 53 6.7 N.A. N.A. 9 1.8 44 15.6  Apneas & Hyp. 4% 95 11.9 N.A. N.A. 40 7.8 55 19.5             RERAs - - - - - - - -  RDI 106 13.3 N.A. N.A. 46 8.9 60 21.3   Oxygen Saturation Summary   Total Supine Non-Supine  Average SpO2 96.1% 96.3% 95.8%  Minimum SpO2 79.0% 79.0% 82.0%   Maximum SpO2 100.0%  99.0% 100.0%   Oxygen Saturation Distribution  Range (%) Time in range (min) Time in range (%)  90.0 - 100.0 459.6 97.2%  80.0 - 90.0 13.0 2.8%  70.0 - 80.0 0.1 0.0%  60.0 - 70.0 - -  50.0 - 60.0 - -  0.0 - 50.0 - -  Time Spent <=88% SpO2  Range (%) Time in range (min) Time in range (%)  0.0 - 88.0 7.3 1.5%  Cardiac Summary   Total Supine Non-Supine  Average Pulse Rate (BPM) 79.0 77.3 82.0  Minimum Pulse Rate (BPM) 62.0 62.0 68.0  Maximum Pulse Rate (BPM) 120.0 106.0 120.0                      Technologist Comments  -                      [name], Biomedical engineer of Sleep Medicine  ELECTRONICALLY SIGNED ON:  10/08/2023, 1:12 PM [facility] PH: [phone]   FX: [phone] ACCREDITED BY THE [employer] OF SLEEP MEDIC[employer]

## 2023-10-24 ENCOUNTER — Ambulatory Visit: Attending: Physician Assistant | Admitting: Audiologist

## 2023-10-24 DIAGNOSIS — H9201 Otalgia, right ear: Secondary | ICD-10-CM | POA: Diagnosis present

## 2023-10-24 DIAGNOSIS — R94128 Abnormal results of other function studies of ear and other special senses: Secondary | ICD-10-CM | POA: Insufficient documentation

## 2023-10-24 DIAGNOSIS — H903 Sensorineural hearing loss, bilateral: Secondary | ICD-10-CM | POA: Insufficient documentation

## 2023-10-24 NOTE — Procedures (Signed)
  Outpatient Audiology and [facility] [address] [place], [place]  [address] [phone]  AUDIOLOGICAL  EVALUATION  NAME: Olivia Sims     DOB:   February 15, 1970      MRN: [id number]                                                                                     DATE: 10/24/2023     REFERENT: Kathrene Mardy HERO, PA-C STATUS: Outpatient DIAGNOSIS: Sensorineural Hearing Loss Bilateral   History: Olivia Sims was seen for an audiological evaluation due to difficulty hearing people at a distance and in noise. 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 Her younger sister wears hearing aids. There is a strong family history of hearing loss.  Olivia Sims denies pain, pressure, or constant tinnitus. Years ago she ruptured her right eardrum. This was due to infection and a blood clot.  Olivia Sims has history of hazardous noise exposure. She has has sinus and throat issues in the past and was seen by Otolaryngology. She feels her right ear is stopped up. She has occasional stabbing pain that ear only.  Medical history shows no additional risk for hearing loss.    Evaluation:  Otoscopy showed a clear view of the tympanic membranes, bilaterally Tympanometry results were consistent with flat response in the right ear and a shallow response in the left ear  Audiometric testing was completed using Conventional Audiometry techniques with insert earphones and supraural headphones. Test results are consistent with mild to moderate relatively flat sensorineural hearing loss 500-8kHz bilaterally. Speech Recognition Thresholds were obtained at 35dB HL in the right ear and at 40dB HL in the left ear. Word Recognition Testing was completed at  40dB SL and Olivia Sims scored 96% in the right ear and 100% in the left ear.    Results:  The test results were reviewed with Olivia Sims. She has mild to moderate sensorineural  hearing loss bilaterally. She would likely  benefit from aid. She also has abnormal middle ear function in the right ear, she could benefit from seeing Otolaryngology. No indication of a conductive component to today's hearing loss.  Audiogram printed and provided to Olivia Sims.      Recommendations: Hearing aids recommended for both ears. Patient given list of local hearing aid providers.  Annual audiometric testing recommended to monitor hearing loss for progression.  Recommend referral to Otolaryngology due to pain and pressure in right ear with flat tympanogram   31 minutes spent testing and counseling on results.   If you have any questions please feel free to contact me at [phone].  Lauraine Ka Stalnaker Au.D.  Audiologist   10/24/2023  3:45 PM  Cc: Allwardt, Mardy HERO, PA-C

## 2023-11-09 ENCOUNTER — Encounter: Payer: Self-pay | Admitting: Physician Assistant

## 2023-11-09 NOTE — Telephone Encounter (Signed)
 Please see pt msg and advise on referral

## 2023-11-10 ENCOUNTER — Other Ambulatory Visit: Payer: Self-pay | Admitting: Physician Assistant

## 2023-11-10 DIAGNOSIS — H9201 Otalgia, right ear: Secondary | ICD-10-CM

## 2023-11-10 DIAGNOSIS — R94128 Abnormal results of other function studies of ear and other special senses: Secondary | ICD-10-CM

## 2023-11-17 ENCOUNTER — Encounter (INDEPENDENT_AMBULATORY_CARE_PROVIDER_SITE_OTHER): Payer: Self-pay | Admitting: Physician Assistant

## 2023-11-17 ENCOUNTER — Ambulatory Visit (INDEPENDENT_AMBULATORY_CARE_PROVIDER_SITE_OTHER): Admitting: Physician Assistant

## 2023-11-17 VITALS — BP 129/80 | HR 79 | Ht 63.0 in | Wt 226.0 lb

## 2023-11-17 DIAGNOSIS — H903 Sensorineural hearing loss, bilateral: Secondary | ICD-10-CM

## 2023-11-17 DIAGNOSIS — J392 Other diseases of pharynx: Secondary | ICD-10-CM | POA: Diagnosis not present

## 2023-11-17 DIAGNOSIS — H9481 Other specified disorders of right ear in diseases classified elsewhere: Secondary | ICD-10-CM

## 2023-11-17 NOTE — Patient Instructions (Signed)
 I have ordered an imaging study for you to complete prior to your next visit. Please call [facility] Scheduling at [phone] to schedule your imaging if you have not received a call within 24 hours. If you are unable to complete your imaging study prior to your next scheduled visit please call our office to let us  know.

## 2023-11-17 NOTE — Progress Notes (Addendum)
 Dear Dr. Kathrene, Here is my assessment for our mutual patient, Olivia Sims. Thank you for allowing me the opportunity to care for your patient. Please do not hesitate to contact me should you have any other questions. Sincerely, Chyrl Cohen PA-C  Otolaryngology Clinic Note Referring provider: Dr. Kathrene HPI:  Olivia Sims is a 53 y.o. female kindly referred by Dr. Kathrene   The patient is an is a 53 year old female seen in our office for evaluation of decreased hearing.  The patient notes that she had a longstanding history of progressive hearing loss, she notes over the last 6 to 8 months it has been worsening.  She feels like her partner has difficulty hearing her at home.  She notes that the hearing appears symmetric.  She does work in a environment with loud noises.  Given the ongoing symptoms she was referred to our office for evaluation.  She denies any history of head or neck trauma, no head or neck surgeries other than tonsillectomy and uvulectomy when she was in her 30s for recurrent throat infections and snoring.  She notes approximately 1 episode of acute otitis media per year, she notes last 1 was approximately 1 year ago.  She denies any significant pain in the ear, no ringing, no dizziness.  She notes a longstanding history of snoring, she recently was diagnosed with sleep apnea and had a sleep study and is awaiting the results of these.  She notes a remote history of smoking noting she quit approximately 23 years ago, she smoked for 16 years.  She notes that her partner does smoke.  No history of head or neck cancer, no sore throat although it does get dry and itchy.  She denies any changes to her voice, no difficulty swallowing. She reports some seasonal allergies, Claritin  as needed. She does not take any anticoagulants or anti-platelets.    Independent Review of Additional Tests or Records:  Audiological evaluation on 11/17/2023       Outpatient Audiology and  [facility] [address] [place], [place]  [address] [phone]   AUDIOLOGICAL  EVALUATION   NAME: Olivia Sims                                               DOB:   27-Feb-1970                                                MRN: [id number]                                                                                     DATE: 10/24/2023                                             REFERENT: Kathrene Mardy HERO, PA-C  STATUS: Outpatient DIAGNOSIS: Sensorineural Hearing Loss Bilateral    History: Olivia Sims was seen for an audiological evaluation due to difficulty hearing people at a distance and in noise. 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 Her younger sister wears hearing aids. There is a strong family history of hearing loss.  Olivia Sims denies pain, pressure, or constant tinnitus. Years ago she ruptured her right eardrum. This was due to infection and a blood clot.  Olivia Sims has history of hazardous noise exposure. She has has sinus and throat issues in the past and was seen by Otolaryngology. She feels her right ear is stopped up. She has occasional stabbing pain that ear only.  Medical history shows no additional risk for hearing loss.      Evaluation:  Otoscopy showed a clear view of the tympanic membranes, bilaterally Tympanometry results were consistent with flat response in the right ear and a shallow response in the left ear  Audiometric testing was completed using Conventional Audiometry techniques with insert earphones and supraural headphones. Test results are consistent with mild to moderate relatively flat sensorineural hearing loss 500-8kHz bilaterally. Speech Recognition Thresholds were obtained at 35dB HL in the right ear and at 40dB HL in the left ear. Word Recognition Testing was completed at  40dB SL and Olivia Sims scored 96% in the right ear and 100% in the left ear.     Results:  The test results were  reviewed with Olivia Sims. She has mild to moderate sensorineural  hearing loss bilaterally. She would likely benefit from aid. She also has abnormal middle ear function in the right ear, she could benefit from seeing Otolaryngology. No indication of a conductive component to today's hearing loss.       PMH/Meds/All/SocHx/FamHx/ROS:   Past Medical History:  Diagnosis Date   Allergy    Complication of anesthesia    bladder  froze' years ago per pt    Depression    Elevated LDL cholesterol level    Endometrial polyp 2021   Exercise-induced asthma    pt denies at 4/20/visit    History of kidney stones    Kidney stones    Pre-diabetes    Prediabetes    Rheumatoid arthritis (HCC)    Right ovarian cyst 2021   Sleep apnea    hx of had surgery to correct    Urinary incontinence    Vitamin D  deficiency      Past Surgical History:  Procedure Laterality Date   COLONOSCOPY WITH PROPOFOL  N/A 08/24/2022   Procedure: COLONOSCOPY WITH PROPOFOL ;  Surgeon: Stacia Glendia BRAVO, MD;  Location: [facility];  Service: Gastroenterology;  Laterality: N/A;   CYSTOSCOPY N/A 05/14/2019   Procedure: CYSTOSCOPY;  Surgeon: Cathlyn JAYSON Nikki Bobie BRAVO, MD;  Location: [facility];  Service: Gynecology;  Laterality: N/A;   GALLBLADDER SURGERY     KIDNEY STONE SURGERY     OVARIAN CYST REMOVAL Right 05/14/2019   Procedure: Excision RIGHT Para Tubal CYST;  Surgeon: Cathlyn JAYSON Nikki Bobie BRAVO, MD;  Location: [facility];  Service: Gynecology;  Laterality: Right;  right adnexal cyst removal   PALATE / UVULA BIOPSY / EXCISION     shoulder surgery      TONSILLECTOMY     TOTAL LAPAROSCOPIC HYSTERECTOMY WITH SALPINGECTOMY N/A 05/14/2019   Procedure: TOTAL LAPAROSCOPIC HYSTERECTOMY WITH SALPINGECTOMY, Pelvic Washings;  Surgeon: Cathlyn JAYSON Nikki Bobie BRAVO, MD;  Location: [facility];  Service: Gynecology;  Laterality: N/A;    Family History  Problem Relation Age of Onset    Diabetes Mother    Hypothyroidism Mother    Heart Problems Mother        pacemaker    Cancer Mother        endometrial ca?   Thyroid  disease Mother    Hypertension Father    Diabetes Father    Non-Hodgkin's lymphoma Paternal Grandmother    Esophageal cancer Neg Hx    Rectal cancer Neg Hx    Stomach cancer Neg Hx    Colon polyps Neg Hx    Colon cancer Neg Hx    Breast cancer Neg Hx      Social Connections: Unknown (06/02/2021)   Received from [employer]   Social Network    Social Network: Not on file      Current Outpatient Medications:    albuterol  (VENTOLIN  HFA) 108 (90 Base) MCG/ACT inhaler, Inhale 1-2 puffs into the lungs every 6 (six) hours as needed., Disp: 8 g, Rfl: 1   buPROPion  (WELLBUTRIN  XL) 150 MG 24 hr tablet, TAKE 1 TABLET BY MOUTH EVERY MORNING, Disp: 90 tablet, Rfl: 1   clobetasol cream (TEMOVATE) 0.05 %, Apply 1 Application topically 2 (two) times daily as needed., Disp: , Rfl:    fluticasone  (FLONASE ) 50 MCG/ACT nasal spray, Place 2 sprays into both nostrils daily., Disp: 16 g, Rfl: 3   fluticasone  (FLONASE ) 50 MCG/ACT nasal spray, Place 2 sprays into both nostrils daily. (Patient not taking: Reported on 09/13/2023), Disp: 16 g, Rfl: 3   folic acid  (FOLVITE ) 1 MG tablet, Take 1 mg by mouth daily., Disp: , Rfl:    furosemide  (LASIX ) 20 MG tablet, Take 1 tablet (20 mg total) by mouth daily., Disp: 30 tablet, Rfl: 2   Golimumab (SIMPONI ARIA IV), Inject into the vein. Infusion every 8 weeks, Disp: , Rfl:    Insulin  Syringe-Needle U-100 27G X 5/8 1 ML MISC, Use as directed with methotrexate , Disp: 13 each, Rfl: 3   meloxicam  (MOBIC ) 15 MG tablet, Take 1 tablet (15 mg total) by mouth daily., Disp: 30 tablet, Rfl: 0   methotrexate  50 MG/2ML injection, Inject 0.6ml into the skin once weekly on the same day of each week (single use vials), Disp: 10 mL, Rfl: 2   oxyCODONE -acetaminophen  (PERCOCET/ROXICET) 5-325 MG tablet, 1 tablet by mouth four times a day as needed for  pain, Disp: 120 tablet, Rfl: 0   predniSONE  (DELTASONE ) 50 MG tablet, Take one tablet po qd x 5 days with food. (Patient not taking: Reported on 09/13/2023), Disp: 5 tablet, Rfl: 0   promethazine  (PHENERGAN ) 25 MG tablet, Take 1 tablet (25 mg total) by mouth every 6 (six) hours as needed for nausea or vomiting. (Patient not taking: Reported on 09/13/2023), Disp: 20 tablet, Rfl: 0   topiramate  (TOPAMAX ) 25 MG tablet, Take 1 tablet (25 mg total) by mouth at bedtime., Disp: 90 tablet, Rfl: 1   Physical Exam:   BP 129/80   Pulse 79   Ht 5' 3 (1.6 m)   Wt 226 lb (102.5 kg)   LMP 04/19/2019   SpO2 94%   BMI 40.03 kg/m   Pertinent Findings  CN II-XII grossly intact Bilateral EAC clear and TM intact with well pneumatized middle ear spaces, no obvious right-sided effusion Anterior rhinoscopy: Septum midline; bilateral inferior turbinates with moderate hypertrophy No lesions of oral cavity/oropharynx; dentition within normal limits, surgically absent tonsils and uvula No obviously palpable neck  masses/lymphadenopathy/thyromegaly No respiratory distress or stridor    Seprately Identifiable Procedures:  Procedure Note Pre-procedure diagnosis: Right sided middle ear effusion Post-procedure diagnosis: Same Procedure: Transnasal Fiberoptic Laryngoscopy, CPT 31575 - Mod 25 Indication: see above Complications: None apparent EBL: 0 mL   The procedure was undertaken to further evaluate the patient's complaint of right-sided middle ear effusion, with mirror exam inadequate for appropriate examination due to gag reflex and poor patient tolerance   Procedure:  Patient was identified as correct patient. Verbal consent was obtained. The nose was sprayed with oxymetazoline and 4% lidocaine . The The flexible laryngoscope was passed through the nose to view the nasal cavity, pharynx (oropharynx, hypopharynx) and larynx.  The larynx was examined at rest and during multiple phonatory tasks. Documentation was  obtained and reviewed with patient. The scope was removed. The patient tolerated the procedure well.   Findings: The nasal cavity and did not reveal any masses or lesions, the nasopharynx showed irregular lobulated pink tissues filling the right and left Rosenmuller fossa extending to the posterior nasopharyngeal wall and encroaching the left eustachian tube orifice and abutting the right eustachian tube orifice, no purulence noted.  The visualized portion of the subglottis and proximal trachea is widely patent. The vocal folds are mobile bilaterally.. There are no lesions on the free edge of the vocal folds worrisome for malignancy.                Impression & Plans:  Olivia Sims is a 53 y.o. female with the following   Assessment and Plan  Sensorineural hearing loss-  Bilateral sensorineural hearing loss, no significant asymmetry.  She did have right-sided type B tympanometry, no obvious fluid on exam.  Given the left-sided type B tympanometry nasal endoscope was performed which showed abnormal tissue abutting the right eustachian tube opening.  Abnormal tissue nasopharynx-  Given the right sided type B tympanometry a performed nasal endoscope, this was significant for abnormal lobulated tissue throughout the entire nasopharynx, this is likely the source of her right sided eustachian tube blockage.  Uncertain etiology of the tissue, this may be adenoidal tissue but given suspicious findings I would recommend MRI face for further evaluation.  Patient may subsequently need biopsy as well.  I have placed the order for the MRI, once the results are available I will call the patient and discuss plan for moving forward.   - f/u phone call discussion after MRI   Thank you for allowing me the opportunity to care for your patient. Please do not hesitate to contact me should you have any other questions.  Sincerely, Chyrl Cohen PA-C  [facility] Phone:  [phone] Fax: [phone]  11/17/2023, 1:29 PM

## 2023-11-21 ENCOUNTER — Encounter: Payer: Self-pay | Admitting: Radiology

## 2023-11-29 ENCOUNTER — Ambulatory Visit (HOSPITAL_COMMUNITY)
Admission: RE | Admit: 2023-11-29 | Discharge: 2023-11-29 | Disposition: A | Discharge status: Home / Self Care | Source: Ambulatory Visit | Attending: Physician Assistant | Admitting: Physician Assistant

## 2023-11-29 DIAGNOSIS — J392 Other diseases of pharynx: Secondary | ICD-10-CM | POA: Diagnosis present

## 2023-11-29 MED ORDER — GADOBUTROL 1 MMOL/ML IV SOLN
10.0000 mL | Freq: Once | INTRAVENOUS | Status: AC | PRN
  Administered 2023-11-29: 10 mL via INTRAVENOUS

## 2023-12-05 ENCOUNTER — Telehealth (INDEPENDENT_AMBULATORY_CARE_PROVIDER_SITE_OTHER): Payer: Self-pay | Admitting: Physician Assistant

## 2023-12-05 DIAGNOSIS — J392 Other diseases of pharynx: Secondary | ICD-10-CM

## 2023-12-05 NOTE — Telephone Encounter (Signed)
 I spoke with Jon Novak today about her MRI results. Noted below.  IMPRESSION: 1. Prominent nasopharyngeal mucosa without a discrete mass, with homogeneous enhancement. 2. Numerous shotty submandibular and jugular digastric lymph nodes bilaterally in the cervical 1 through 3 nodal chains, measuring up to 12 mm in long axis. Although the lymph nodes are thought likely reactive, PET CT fusion study might be considered to assess for early metastatic disease. 3. Right mastoid air cell effusion.  I dicussed her case with Dr. Tobie who had the opportunity to reviewe her imaging. Given her clinic picture it would be reasonable to perfomr adenoidectomy and biopsy to rule out malignancy. I discussed the risk and benefits with her and offered for her to have office visit with Dr. Tobie or meet him the day of surgery to go over any questions or concerns she may have. She would like to proceed with surgery scheduling and meet Dr. Tobie the day of surgery. She will reach out to me if she has any questions or concerns in the meantime.

## 2023-12-05 NOTE — Addendum Note (Signed)
 Addended byBETHA COHEN, Caitlin Hillmer on: 12/05/2023 11:31 AM   Modules accepted: Orders

## 2023-12-07 ENCOUNTER — Ambulatory Visit: Admitting: Nurse Practitioner

## 2023-12-07 ENCOUNTER — Encounter: Payer: Self-pay | Admitting: Nurse Practitioner

## 2023-12-07 VITALS — BP 122/78 | HR 92 | Temp 98.1°F | Ht 63.0 in | Wt 229.8 lb

## 2023-12-07 DIAGNOSIS — Z6841 Body Mass Index (BMI) 40.0 and over, adult: Secondary | ICD-10-CM

## 2023-12-07 DIAGNOSIS — G4733 Obstructive sleep apnea (adult) (pediatric): Secondary | ICD-10-CM | POA: Diagnosis not present

## 2023-12-07 DIAGNOSIS — E66813 Obesity, class 3: Secondary | ICD-10-CM

## 2023-12-07 NOTE — Patient Instructions (Signed)
 Your sleep study revealed mild sleep apnea. There is minimal cardiovascular risks associated with mild sleep apnea. We also briefly reviewed treatment options including weight loss, side sleeping position, oral appliance, CPAP therapy or referral to ENT for possible surgical options  You have opted for an oral appliance Referral was placed to orthodontics. They should contact you in the next few weeks to set up a consultation If this is not a cost affordable option or you decide you'd rather do CPAP, let us  know and I can put orders in for the CPAP  Encourage healthy weight loss measures as this can help reduce or resolve your sleep apnea  Use caution when driving and pull over if you become sleepy as sleep apnea does put you at higher risk for driving accidents due to drowsiness/reduced alert/response time   Follow up in 6 months with any sleep provider, or sooner, if needed

## 2023-12-07 NOTE — Progress Notes (Signed)
 @Patient  ID: Olivia Sims, female    DOB: [birth year], 53 y.o.   MRN: [id number]  Chief Complaint  Patient presents with   Consult    Sleep Pt c/o snoring, wakes up throughout the night, does not sleep very soundly    Referring provider: Cozetta Back, MD  HPI: 53 year old female, former smoker referred for sleep consult. Past medical history significant for OSA, RA, prediabetes, obesity, depression, anxiety.  TEST/EVENTS:  10/05/2023 HST: AHI 11.9/h (4%), 13.3/h (3%); SpO2 low 79%  12/07/2023: Today - sleep consult  Discussed the use of AI scribe software for clinical note transcription with the patient, who gave verbal consent to proceed.  History of Present Illness Olivia Sims is a 54 year old female who presents for a sleep consult.  She underwent a sleep study in September, which revealed mild sleep apnea. During these events, her oxygen saturation dropped to 79%, although she did not spend significant time at or below 88%.  She experiences persistent daytime fatigue, stating 'I'm tired all the time,' which is her primary concern. She wakes up a couple of times during the night but falls asleep easily. She does not take any sleep medications. She does snore.   She does not experience falling asleep while driving, although she gets drowsy on longer drives but manages well around town. No sleep parasomnias/paralysis. Occasional headaches, which she attributes to other issues.   She reports having had her uvula removed in conjunction with a tonsillectomy several years ago, which was done in hopes of helping with her sleep apnea. Never really noticed much of a difference.   She goes to bed around 9-10 pm. Gets up around 5 am. Weight relatively stable over the last 2 years. Last sleep study 09/2023. Never been on CPAP. No oxygen therapy.   No sleep aids. 1 alcoholic beverage a week. Lives with her sister. Works as a product manager. No heavy equipment.     12/07/2023     1:00 PM  Results of the Epworth flowsheet  Sitting and reading 2  Watching TV 2  Sitting, inactive in a public place (e.g. a theatre or a meeting) 1  As a passenger in a car for an hour without a break 2  Lying down to rest in the afternoon when circumstances permit 3  Sitting and talking to someone 1  Sitting quietly after a lunch without alcohol 1  In a car, while stopped for a few minutes in traffic 2  Total score 14       No Known Allergies  Immunization History  Administered Date(s) Administered   DTaP 05/26/2006   Influenza, Seasonal, Injecte, Preservative Fre 11/12/2022   Influenza,inj,Quad PF,6+ Mos 01/08/2019, 11/19/2021   Influenza-Unspecified 02/02/2011, 12/19/2012, 09/20/2013, 11/19/2021   PFIZER(Purple Top)SARS-COV-2 Vaccination 05/24/2019, 06/15/2019   PNEUMOCOCCAL CONJUGATE-20 09/13/2023   Pneumococcal Conjugate,unspecified 11/19/2021   Tdap 05/20/2022   Zoster Recombinant(Shingrix ) 11/19/2021, 05/20/2022    Past Medical History:  Diagnosis Date   Allergy    Complication of anesthesia    bladder  froze' years ago per pt    Depression    Elevated LDL cholesterol level    Endometrial polyp 2021   Exercise-induced asthma    pt denies at 4/20/visit    History of kidney stones    Kidney stones    Pre-diabetes    Prediabetes    Rheumatoid arthritis (HCC)    Right ovarian cyst 2021   Sleep apnea    hx of had  surgery to correct    Urinary incontinence    Vitamin D  deficiency     Tobacco History: Social History   Tobacco Use  Smoking Status Former   Current packs/day: 0.00   Average packs/day: 1 pack/day for 16.0 years (16.0 ttl pk-yrs)   Types: Cigarettes   Start date: 36   Quit date: 2002   Years since quitting: 23.8  Smokeless Tobacco Never  Tobacco Comments   Smoked for 16 years, about 1ppd. Quit in 1999.   Counseling given: Not Answered Tobacco comments: Smoked for 16 years, about 1ppd. Quit in 1999.   Outpatient Medications Prior  to Visit  Medication Sig Dispense Refill   albuterol  (VENTOLIN  HFA) 108 (90 Base) MCG/ACT inhaler Inhale 1-2 puffs into the lungs every 6 (six) hours as needed. 8 g 1   clobetasol cream (TEMOVATE) 0.05 % Apply 1 Application topically 2 (two) times daily as needed.     fluticasone  (FLONASE ) 50 MCG/ACT nasal spray Place 2 sprays into both nostrils daily. 16 g 3   folic acid  (FOLVITE ) 1 MG tablet Take 1 mg by mouth daily.     furosemide  (LASIX ) 20 MG tablet Take 1 tablet (20 mg total) by mouth daily. 30 tablet 2   Golimumab (SIMPONI ARIA IV) Inject into the vein. Infusion every 8 weeks     Insulin  Syringe-Needle U-100 27G X 5/8 1 ML MISC Use as directed with methotrexate  13 each 3   methotrexate  50 MG/2ML injection Inject 0.6ml into the skin once weekly on the same day of each week (single use vials) 10 mL 2   oxyCODONE -acetaminophen  (PERCOCET/ROXICET) 5-325 MG tablet 1 tablet by mouth four times a day as needed for pain 120 tablet 0   promethazine  (PHENERGAN ) 25 MG tablet Take 1 tablet (25 mg total) by mouth every 6 (six) hours as needed for nausea or vomiting. 20 tablet 0   buPROPion  (WELLBUTRIN  XL) 150 MG 24 hr tablet TAKE 1 TABLET BY MOUTH EVERY MORNING (Patient not taking: Reported on 12/07/2023) 90 tablet 1   fluticasone  (FLONASE ) 50 MCG/ACT nasal spray Place 2 sprays into both nostrils daily. (Patient not taking: Reported on 12/07/2023) 16 g 3   meloxicam  (MOBIC ) 15 MG tablet Take 1 tablet (15 mg total) by mouth daily. (Patient not taking: Reported on 12/07/2023) 30 tablet 0   topiramate  (TOPAMAX ) 25 MG tablet Take 1 tablet (25 mg total) by mouth at bedtime. (Patient not taking: Reported on 12/07/2023) 90 tablet 1   predniSONE  (DELTASONE ) 50 MG tablet Take one tablet po qd x 5 days with food. (Patient not taking: Reported on 09/13/2023) 5 tablet 0   No facility-administered medications prior to visit.     Review of Systems: as above    Physical Exam:  BP 122/78   Pulse 92   Temp  98.1 F (36.7 C)   Ht 5' 3 (1.6 m) Comment: Per pt  Wt 229 lb 12.8 oz (104.2 kg)   LMP 04/19/2019   SpO2 95% Comment: RA  BMI 40.71 kg/m   GEN: Pleasant, interactive, well-appearing; morbidly obese; in no acute distress HEENT:  Normocephalic and atraumatic. PERRLA. Sclera white. Nasal turbinates pink, moist and patent bilaterally. No rhinorrhea present. Oropharynx pink and moist, without exudate or edema. No lesions, ulcerations, or postnasal drip. Mallampati II/III; evidence of prior soft palate reconstruction  NECK:  Supple w/ fair ROM. No JVD present. No lymphadenopathy.   CV: RRR, no m/r/g PULMONARY:  Unlabored, regular breathing. Clear bilaterally A&P w/o wheezes/rales/rhonchi. No  accessory muscle use.  GI: BS present and normoactive. Soft, non-tender to palpation.  MSK: No erythema, warmth or tenderness.  Neuro: A/Ox3. No focal deficits noted.   Skin: Warm, no lesions or rashe Psych: Normal affect and behavior. Judgement and thought content appropriate.     Lab Results:  CBC    Component Value Date/Time   WBC 10.6 (H) 11/26/2021 1024   RBC 4.73 11/26/2021 1024   HGB 13.7 11/26/2021 1024   HGB 14.2 06/04/2019 1602   HCT 41.7 11/26/2021 1024   HCT 42.8 06/04/2019 1602   PLT 284.0 11/26/2021 1024   PLT 315 06/04/2019 1602   MCV 88.2 11/26/2021 1024   MCV 86 06/04/2019 1602   MCH 28.4 06/04/2019 1602   MCH 28.6 05/14/2019 1449   MCHC 32.8 11/26/2021 1024   RDW 13.8 11/26/2021 1024   RDW 14.2 06/04/2019 1602   LYMPHSABS 1.8 11/26/2021 1024   LYMPHSABS 2.4 06/04/2019 1602   MONOABS 0.9 11/26/2021 1024   EOSABS 0.1 11/26/2021 1024   EOSABS 0.3 06/04/2019 1602   BASOSABS 0.1 11/26/2021 1024   BASOSABS 0.1 06/04/2019 1602    BMET    Component Value Date/Time   NA 135 09/20/2023 1517   NA 136 01/08/2019 0914   K 4.1 09/20/2023 1517   CL 99 09/20/2023 1517   CO2 27 09/20/2023 1517   GLUCOSE 91 09/20/2023 1517   BUN 15 09/20/2023 1517   BUN 13 01/08/2019 0914    CREATININE 0.83 09/20/2023 1517   CALCIUM 8.8 09/20/2023 1517   GFRNONAA >60 05/08/2019 1204   GFRAA >60 05/08/2019 1204    BNP No results found for: BNP   Imaging:  MR FACE TRIGEMINAL W WO CONTRAST Result Date: 12/03/2023 EXAM: MRI FACE WITHOUT AND WITH CONTRAST 11/29/2023 05:15:59 PM TECHNIQUE: Multiplanar, multisequence MRI of the face was performed without and with intravenous contrast. CONTRAST: 10 mL of Gadavist. COMPARISON: None available CLINICAL HISTORY: Head and neck cancer, staging, nasopharyngeal mass. FINDINGS: AERODIGESTIVE TRACT: The nasopharyngeal mucosa is prominent but a discrete mass is not readily apparent. The mucosa enhances homogeneously. No edema. SALIVARY GLANDS: Unremarkable. LYMPH NODES: There are numerous shotty submandibular and jugular digastric lymph nodes present bilaterally in the cervical 1 through 3 nodal chains measuring up to 12 mm in long axis. SOFT TISSUES: No acute abnormality or mass. ORBITS: No acute abnormality or mass. SINUSES AND MASTOIDS: There is a right mastoid air cell effusion. The rest of the visualized sinuses are clear. BRAIN: No acute abnormality. BONES: Normal bone marrow signal. IMPRESSION: 1. Prominent nasopharyngeal mucosa without a discrete mass, with homogeneous enhancement. 2. Numerous shotty submandibular and jugular digastric lymph nodes bilaterally in the cervical 1 through 3 nodal chains, measuring up to 12 mm in long axis. Although the lymph nodes are thought likely reactive, PET CT fusion study might be considered to assess for early metastatic disease. 3. Right mastoid air cell effusion. Electronically signed by: Evalene Coho MD 12/03/2023 06:04 AM EST RP Workstation: [id number]    Administration History     None           No data to display          No results found for: NITRICOXIDE      Assessment & Plan:   Mild obstructive sleep apnea Mild OSA. Reviewed minimal cardiovascular risks associated  with mild OSA. Reviewed treatment options. Pt opted for oral appliance. If this is not cost affordable, she would be open to trial of CPAP. Referral placed to  orthodontics. Safe driving practices reviewed.  - discussed how weight can impact sleep and risk for sleep disordered breathing - discussed options to assist with weight loss: combination of diet modification, cardiovascular and strength training exercises   - had an extensive discussion regarding the adverse health consequences related to untreated sleep disordered breathing - specifically discussed the risks for hypertension, coronary artery disease, cardiac dysrhythmias, cerebrovascular disease, and diabetes - lifestyle modification discussed   - discussed how sleep disruption can increase risk of accidents, particularly when driving - safe driving practices were discussed  Patient Instructions  Your sleep study revealed mild sleep apnea. There is minimal cardiovascular risks associated with mild sleep apnea. We also briefly reviewed treatment options including weight loss, side sleeping position, oral appliance, CPAP therapy or referral to ENT for possible surgical options  You have opted for an oral appliance Referral was placed to orthodontics. They should contact you in the next few weeks to set up a consultation If this is not a cost affordable option or you decide you'd rather do CPAP, let us  know and I can put orders in for the CPAP  Encourage healthy weight loss measures as this can help reduce or resolve your sleep apnea  Use caution when driving and pull over if you become sleepy as sleep apnea does put you at higher risk for driving accidents due to drowsiness/reduced alert/response time   Follow up in 6 months with any sleep provider, or sooner, if needed     Class 3 severe obesity with body mass index (BMI) of 40.0 to 44.9 in adult (HCC) BMI 40. Healthy weight loss encouraged   Advised if symptoms do not improve  or worsen, to please contact office for sooner follow up or seek emergency care.   I spent 35 minutes of dedicated to the care of this patient on the date of this encounter to include pre-visit review of records, face-to-face time with the patient discussing conditions above, post visit ordering of testing, clinical documentation with the electronic health record, making appropriate referrals as documented, and communicating necessary findings to members of the patients care team.  Comer LULLA Rouleau, NP 12/07/2023  Pt aware and understands NP's role.

## 2023-12-07 NOTE — Assessment & Plan Note (Signed)
 Mild OSA. Reviewed minimal cardiovascular risks associated with mild OSA. Reviewed treatment options. Pt opted for oral appliance. If this is not cost affordable, she would be open to trial of CPAP. Referral placed to orthodontics. Safe driving practices reviewed.  - discussed how weight can impact sleep and risk for sleep disordered breathing - discussed options to assist with weight loss: combination of diet modification, cardiovascular and strength training exercises   - had an extensive discussion regarding the adverse health consequences related to untreated sleep disordered breathing - specifically discussed the risks for hypertension, coronary artery disease, cardiac dysrhythmias, cerebrovascular disease, and diabetes - lifestyle modification discussed   - discussed how sleep disruption can increase risk of accidents, particularly when driving - safe driving practices were discussed  Patient Instructions  Your sleep study revealed mild sleep apnea. There is minimal cardiovascular risks associated with mild sleep apnea. We also briefly reviewed treatment options including weight loss, side sleeping position, oral appliance, CPAP therapy or referral to ENT for possible surgical options  You have opted for an oral appliance Referral was placed to orthodontics. They should contact you in the next few weeks to set up a consultation If this is not a cost affordable option or you decide you'd rather do CPAP, let us  know and I can put orders in for the CPAP  Encourage healthy weight loss measures as this can help reduce or resolve your sleep apnea  Use caution when driving and pull over if you become sleepy as sleep apnea does put you at higher risk for driving accidents due to drowsiness/reduced alert/response time   Follow up in 6 months with any sleep provider, or sooner, if needed

## 2023-12-07 NOTE — Assessment & Plan Note (Signed)
 BMI 40. Healthy weight loss encouraged

## 2023-12-12 ENCOUNTER — Encounter (INDEPENDENT_AMBULATORY_CARE_PROVIDER_SITE_OTHER): Payer: Self-pay

## 2023-12-27 ENCOUNTER — Telehealth: Payer: Self-pay | Admitting: Nurse Practitioner

## 2023-12-27 NOTE — Telephone Encounter (Signed)
 Copied from CRM #[id number]. Topic: Referral - Question >> Dec 27, 2023 12:13 PM Lavanda D wrote: Reason for CRM: Rosina with Dr. Nena Coca office is calling regarding the referral for Ms. Furuya. They are needing demographics and contact information + a copy of the diagnostic sleep study results sent to the fax # below.  Fax #: [phone]

## 2023-12-30 NOTE — Telephone Encounter (Signed)
 Pt demographics and sleep study has been faxed to Dr. Ruthine office. NFN

## 2023-12-30 NOTE — Telephone Encounter (Signed)
 PCCs, Can you all provide Dr. Wayna office with this information?

## 2024-02-01 ENCOUNTER — Encounter: Payer: Self-pay | Admitting: Physician Assistant

## 2024-02-01 NOTE — Telephone Encounter (Signed)
 Please see pt msg and advise

## 2024-02-02 ENCOUNTER — Ambulatory Visit: Admitting: Physician Assistant

## 2024-02-02 ENCOUNTER — Encounter: Payer: Self-pay | Admitting: Physician Assistant

## 2024-02-02 ENCOUNTER — Telehealth: Admitting: Physician Assistant

## 2024-02-02 VITALS — Ht 63.0 in | Wt 226.0 lb

## 2024-02-02 DIAGNOSIS — J392 Other diseases of pharynx: Secondary | ICD-10-CM | POA: Diagnosis not present

## 2024-02-02 DIAGNOSIS — J189 Pneumonia, unspecified organism: Secondary | ICD-10-CM

## 2024-02-02 MED ORDER — ALBUTEROL SULFATE HFA 108 (90 BASE) MCG/ACT IN AERS: 1.0000 | INHALATION_SPRAY | Freq: Four times a day (QID) | RESPIRATORY_TRACT | 1 refills | Status: AC | PRN

## 2024-02-02 NOTE — Patient Instructions (Signed)
" °  VISIT SUMMARY: You had a follow-up visit for pneumonia, which was diagnosed two days ago. You are currently on antibiotics and experiencing chest pain, but no shortness of breath. Your surgery for a mass near your eustachian tube has been postponed to March 5th.  YOUR PLAN: RIGHT UPPER LOBE PNEUMONIA WITH SMALL PLEURAL EFFUSION: You have pneumonia in the right upper lobe of your lung with a small amount of fluid around it, causing chest pain and difficulty breathing deeply. -Use the albuterol  inhaler as needed to help with breathing. -Continue taking Augmentin  and Z-Pak as prescribed. -Take Lasix  daily for one week to manage fluid retention. -Do a home COVID and flu test to rule out viral causes. -Get a follow-up chest x-ray in four weeks to check if the fluid has resolved. -You have a work excuse letter for your absence due to this illness.  MASS NEAR RIGHT EUSTACHIAN TUBE, PLANNED BIOPSY: You have a mass near your right eustachian tube that needs to be biopsied. The surgery has been postponed due to your current illness. -Schedule an appointment with a pulmonologist to get clearance for surgery. -Your surgery has been rescheduled for March 5th, pending clearance from the pulmonologist.                      Contains text generated by Abridge.                                 Contains text generated by Abridge.   "

## 2024-02-02 NOTE — Progress Notes (Signed)
 "  Virtual Visit via Video Note  I connected with  Olivia Sims  on 02/02/24 at 10:30 AM EST by a video enabled telemedicine application and verified that I am speaking with the correct person using two identifiers.  Location: Patient: home Provider: [employer] at [employer] Persons present: Patient and myself   I discussed the limitations of evaluation and management by telemedicine and the availability of in person appointments. The patient expressed understanding and agreed to proceed.   History of Present Illness:  Discussed the use of AI scribe software for clinical note transcription with the patient, who gave verbal consent to proceed.  History of Present Illness Olivia Sims is a 54 year old female who presents with follow-up for pneumonia.  Two days ago, she was seen at [facility] emergency department where she was diagnosed with pneumonia after experiencing right-sided chest pain. Initially, she attributed the pain to stress due to an upcoming surgery, but it worsened, becoming sharp and preventing her from taking deep breaths.  At the emergency department, she underwent blood work, an EKG, a chest x-ray, and a chest CT with contrast, which revealed a small pleural effusion on the right side. She was started on Augmentin  and a Z-Pak for atypical pneumonia. The pain is located in the right upper lobe, near her shoulder, and it hurts to take deep breaths. No shortness of breath is reported, and she has been using a heating pad for pain relief.  She has been taking Lasix  daily for leg swelling. She denies fever and chills but experienced body aches and felt unwell yesterday. She is unsure if she was tested for flu or COVID-19 at the emergency department.  She was scheduled for surgery to remove and biopsy a mass near her eustachian tube, but it was postponed to March 5th due to her current antibiotic treatment. She needs clearance from a pulmonologist prior to the  surgery due to her history of sleep apnea.  She attempted to work yesterday but was sent home due to her condition.     Observations/Objective:   Gen: Awake, alert, no acute distress Resp: Breathing is even and non-labored; frequent coughing; nasal congestion noted Psych: calm/pleasant demeanor Neuro: Alert and Oriented x 3, + facial symmetry, speech is clear.   Assessment and Plan:  Assessment and Plan Assessment & Plan Right upper lobe pneumonia with small pleural effusion - per patient, no records available on EMR for my review at this time:  Diagnosed with right upper lobe pneumonia and small pleural effusion, likely infection-related. Symptoms include sharp chest pain, cough, and congestion. No fever or chills reported. Differential includes atypical pneumonia. Currently on Augmentin  and Z-Pak. No shortness of breath, but pain with deep breaths. No inhaler prescribed previously, but albuterol  is available. Lasix  recommended daily for a week to manage fluid retention. - Prescribed albuterol  inhaler to aid in airway clearance and ease breathing. - Continue Augmentin  and Z-Pak as prescribed. - Take Lasix  daily for one week to manage fluid retention. - Recommended home COVID and flu testing to rule out viral causes. - Ordered follow-up chest x-ray in four weeks to assess resolution of pleural effusion. - Provided work excuse letter for absence due to acute illness.  Mass near right eustachian tube, planned biopsy Mass near right eustachian tube identified, with surgery planned for biopsy. Surgery postponed due to current antibiotic treatment. Requires clearance from pulmonologist prior to surgery. Previous MRI showed numerous shotty lymph nodes, thought to be reactive.  PET CT fusion study considered but not performed. - Schedule appointment with pulmonologist for clearance prior to surgery. - Rescheduled surgery for March 5th, pending pulmonology clearance.    Follow Up  Instructions:    I discussed the assessment and treatment plan with the patient. The patient was provided an opportunity to ask questions and all were answered. The patient agreed with the plan and demonstrated an understanding of the instructions.   The patient was advised to call back or seek an in-person evaluation if the symptoms worsen or if the condition fails to improve as anticipated.  Aviona Martenson M Hallee Mckenny, PA-C  "

## 2024-02-02 NOTE — Telephone Encounter (Signed)
 Called pt and scheduled virtual visit for today 02/02/24 per PCP request

## 2024-02-03 NOTE — Telephone Encounter (Signed)
 Please see pt response as update

## 2024-02-07 ENCOUNTER — Other Ambulatory Visit: Payer: Self-pay

## 2024-02-07 ENCOUNTER — Other Ambulatory Visit: Payer: Self-pay | Admitting: Physician Assistant

## 2024-02-07 MED ORDER — BENZONATATE 100 MG PO CAPS: 100.0000 mg | ORAL_CAPSULE | Freq: Three times a day (TID) | ORAL | 0 refills | Status: AC | PRN

## 2024-02-07 NOTE — Telephone Encounter (Signed)
 See pt msg and request for meds for persistent cough and advise

## 2024-02-13 ENCOUNTER — Ambulatory Visit: Payer: Self-pay

## 2024-02-13 NOTE — Telephone Encounter (Signed)
See triage note for patient 

## 2024-02-13 NOTE — Telephone Encounter (Signed)
 Message from [place] F sent at 02/13/2024  9:26 AM EST  Summary: Chest pain, trouble breathing   Reason for Triage: Patient had pneumonia 2 weeks ago, and did better last week but now is having chest pains, having trouble breathing, audibly gasping.

## 2024-02-13 NOTE — Telephone Encounter (Signed)
" °  Summary: Chest pain, trouble breathing   Reason for Triage: Patient had pneumonia 2 weeks ago, and did better last week but now is having chest pains, having trouble breathing, audibly gasping.    Reason for Disposition  SEVERE difficulty breathing (e.g., struggling for each breath, speaks in single words)  Answer Assessment - Initial Assessment Questions Pt is taking breaths in between words. Pt states has been breathing like this since yesterday. Rn recommended Er/911. Rn offered to call 911. Pt states she has someone there and they will do it. RN stated understanding.  Due to 911 dispo, not all triage questions answered.     1. RESPIRATORY STATUS: Describe your breathing? (e.g., wheezing, shortness of breath, unable to speak, severe coughing)      Shortness of breath 2. ONSET: When did this breathing problem begin?      yesterday 3. PATTERN Does the difficult breathing come and go, or has it been constant since it started?       4. SEVERITY: How bad is your breathing? (e.g., mild, moderate, severe)       5. RECURRENT SYMPTOM: Have you had difficulty breathing before? If Yes, ask: When was the last time? and What happened that time?       6. CARDIAC HISTORY: Do you have any history of heart disease? (e.g., heart attack, angina, bypass surgery, angioplasty)       7. LUNG HISTORY: Do you have any history of lung disease?  (e.g., pulmonary embolus, asthma, emphysema)      8. CAUSE: What do you think is causing the breathing problem?       9. OTHER SYMPTOMS: Do you have any other symptoms? (e.g., chest pain, cough, dizziness, fever, runny nose)      10. O2 SATURATION MONITOR:  Do you use an oxygen saturation monitor (pulse oximeter) at home? If Yes, ask: What is your reading (oxygen level) today? What is your usual oxygen saturation reading? (e.g., 95%)  Protocols used: Breathing Difficulty-A-AH  "

## 2024-02-13 NOTE — Telephone Encounter (Signed)
 Called pt and lvm to return my call or send MyChart message letting us  know she is being taken care of and going to ED for evaluation

## 2024-02-13 NOTE — Telephone Encounter (Signed)
 FYI Only or Action Required?: FYI only for provider: ED advised.  Patient was last seen in primary care on 02/02/2024 by Allwardt, Mardy HERO, PA-C.  Called Nurse Triage reporting Shortness of Breath and Chest Pain.  Symptoms began yesterday.  Interventions attempted: Other: unknown.  Symptoms are: rapidly worsening.  Triage Disposition: Call EMS 911 Now  Patient/caregiver understands and will follow disposition?: Yes

## 2024-02-16 NOTE — Telephone Encounter (Signed)
 Please see pt msg regarding previous triage note and being admitted inpatient.

## 2024-02-17 ENCOUNTER — Encounter (INDEPENDENT_AMBULATORY_CARE_PROVIDER_SITE_OTHER): Admitting: Physician Assistant

## 2024-02-20 ENCOUNTER — Telehealth: Payer: Self-pay

## 2024-02-21 NOTE — Telephone Encounter (Unsigned)
 Copied from CRM #[id number]. Topic: General - Other >> Feb 21, 2024  9:47 AM Rea ORN wrote: Reason for CRM: Lolita, a case manager with [employer], called to see if pt had a hospital follow up appt set. I advised she did not. Lolita said she wanted to make sure the pt did not have any gaps in care. Pt was discharged 1/30 from [facility] with pneumonia. She has tried to call pt the past 2 days and is unable to reach her to follow up. I confirmed pt phone number. Lolita wanted PCP to be aware.   Please call back to advise,  [phone] ext. [phone]

## 2024-02-28 ENCOUNTER — Inpatient Hospital Stay: Admitting: Physician Assistant

## 2024-03-15 ENCOUNTER — Ambulatory Visit: Admitting: Physician Assistant

## 2024-04-06 ENCOUNTER — Encounter (INDEPENDENT_AMBULATORY_CARE_PROVIDER_SITE_OTHER): Admitting: Physician Assistant

## 2024-09-13 ENCOUNTER — Encounter: Admitting: Physician Assistant
